# Patient Record
Sex: Male | Born: 1980 | Hispanic: No | Marital: Married | Smoking: Never smoker
Health system: Southern US, Community
[De-identification: ages and names within clinical notes are randomized; demographics above are authoritative.]

## PROBLEM LIST (undated history)

## (undated) DIAGNOSIS — G43809 Other migraine, not intractable, without status migrainosus: Secondary | ICD-10-CM

## (undated) DIAGNOSIS — I10 Essential (primary) hypertension: Secondary | ICD-10-CM

## (undated) DIAGNOSIS — K219 Gastro-esophageal reflux disease without esophagitis: Secondary | ICD-10-CM

## (undated) DIAGNOSIS — F329 Major depressive disorder, single episode, unspecified: Secondary | ICD-10-CM

## (undated) DIAGNOSIS — E032 Hypothyroidism due to medicaments and other exogenous substances: Secondary | ICD-10-CM

## (undated) DIAGNOSIS — G4733 Obstructive sleep apnea (adult) (pediatric): Secondary | ICD-10-CM

## (undated) DIAGNOSIS — E0591 Thyrotoxicosis, unspecified with thyrotoxic crisis or storm: Secondary | ICD-10-CM

## (undated) DIAGNOSIS — R0989 Other specified symptoms and signs involving the circulatory and respiratory systems: Secondary | ICD-10-CM

## (undated) DIAGNOSIS — F411 Generalized anxiety disorder: Secondary | ICD-10-CM

## (undated) DIAGNOSIS — E0500 Thyrotoxicosis with diffuse goiter without thyrotoxic crisis or storm: Secondary | ICD-10-CM

## (undated) DIAGNOSIS — E079 Disorder of thyroid, unspecified: Secondary | ICD-10-CM

## (undated) DIAGNOSIS — E349 Endocrine disorder, unspecified: Secondary | ICD-10-CM

## (undated) HISTORY — DX: Obstructive sleep apnea (adult) (pediatric): G47.33

## (undated) HISTORY — DX: Thyrotoxicosis with diffuse goiter without thyrotoxic crisis or storm: E05.00

## (undated) HISTORY — DX: Hypothyroidism due to medicaments and other exogenous substances: E03.2

## (undated) HISTORY — DX: Thyrotoxicosis, unspecified with thyrotoxic crisis or storm: E05.91

## (undated) HISTORY — DX: Other specified symptoms and signs involving the circulatory and respiratory systems: R09.89

## (undated) HISTORY — DX: Morbid (severe) obesity due to excess calories: E66.01

## (undated) HISTORY — DX: Generalized anxiety disorder: F41.1

## (undated) HISTORY — DX: Gastro-esophageal reflux disease without esophagitis: K21.9

## (undated) HISTORY — PX: ADENOIDECTOMY: SHX5191

## (undated) HISTORY — DX: Major depressive disorder, single episode, unspecified: F32.9

## (undated) HISTORY — DX: Other migraine, not intractable, without status migrainosus: G43.809

## (undated) HISTORY — PX: TONGUE SURGERY: SHX810

## (undated) HISTORY — DX: Endocrine disorder, unspecified: E34.9

---

## 2014-06-13 DIAGNOSIS — E032 Hypothyroidism due to medicaments and other exogenous substances: Secondary | ICD-10-CM | POA: Insufficient documentation

## 2014-06-13 DIAGNOSIS — I10 Essential (primary) hypertension: Secondary | ICD-10-CM | POA: Insufficient documentation

## 2014-06-13 DIAGNOSIS — Z8639 Personal history of other endocrine, nutritional and metabolic disease: Secondary | ICD-10-CM | POA: Insufficient documentation

## 2014-06-13 DIAGNOSIS — E349 Endocrine disorder, unspecified: Secondary | ICD-10-CM | POA: Insufficient documentation

## 2014-06-13 DIAGNOSIS — F329 Major depressive disorder, single episode, unspecified: Secondary | ICD-10-CM

## 2014-06-13 HISTORY — DX: Endocrine disorder, unspecified: E34.9

## 2014-06-13 HISTORY — DX: Hypothyroidism due to medicaments and other exogenous substances: E03.2

## 2014-06-13 HISTORY — DX: Morbid (severe) obesity due to excess calories: E66.01

## 2014-06-13 HISTORY — DX: Major depressive disorder, single episode, unspecified: F32.9

## 2014-08-30 HISTORY — PX: OTHER SURGICAL HISTORY: SHX169

## 2014-09-13 DIAGNOSIS — IMO0001 Reserved for inherently not codable concepts without codable children: Secondary | ICD-10-CM | POA: Insufficient documentation

## 2015-02-22 ENCOUNTER — Ambulatory Visit (INDEPENDENT_AMBULATORY_CARE_PROVIDER_SITE_OTHER): Payer: BLUE CROSS/BLUE SHIELD | Admitting: Family Medicine

## 2015-02-22 ENCOUNTER — Encounter: Payer: Self-pay | Admitting: Family Medicine

## 2015-02-22 VITALS — BP 137/84 | HR 82 | Temp 98.2°F | Ht 75.0 in | Wt 349.0 lb

## 2015-02-22 DIAGNOSIS — E291 Testicular hypofunction: Secondary | ICD-10-CM | POA: Diagnosis not present

## 2015-02-22 DIAGNOSIS — F329 Major depressive disorder, single episode, unspecified: Secondary | ICD-10-CM

## 2015-02-22 DIAGNOSIS — E032 Hypothyroidism due to medicaments and other exogenous substances: Secondary | ICD-10-CM

## 2015-02-22 DIAGNOSIS — G43809 Other migraine, not intractable, without status migrainosus: Secondary | ICD-10-CM | POA: Insufficient documentation

## 2015-02-22 DIAGNOSIS — E349 Endocrine disorder, unspecified: Secondary | ICD-10-CM

## 2015-02-22 DIAGNOSIS — E669 Obesity, unspecified: Secondary | ICD-10-CM

## 2015-02-22 DIAGNOSIS — L299 Pruritus, unspecified: Secondary | ICD-10-CM

## 2015-02-22 DIAGNOSIS — Z8639 Personal history of other endocrine, nutritional and metabolic disease: Secondary | ICD-10-CM

## 2015-02-22 DIAGNOSIS — H938X9 Other specified disorders of ear, unspecified ear: Secondary | ICD-10-CM | POA: Insufficient documentation

## 2015-02-22 DIAGNOSIS — I10 Essential (primary) hypertension: Secondary | ICD-10-CM

## 2015-02-22 DIAGNOSIS — H66001 Acute suppurative otitis media without spontaneous rupture of ear drum, right ear: Secondary | ICD-10-CM

## 2015-02-22 DIAGNOSIS — R5383 Other fatigue: Secondary | ICD-10-CM

## 2015-02-22 DIAGNOSIS — F32A Depression, unspecified: Secondary | ICD-10-CM

## 2015-02-22 MED ORDER — TESTOSTERONE CYPIONATE 100 MG/ML IM SOLN: 100.0000 mg | INTRAMUSCULAR | Status: DC

## 2015-02-22 MED ORDER — TRIAMCINOLONE 0.1 % CREAM:EUCERIN CREAM 1:1: 1.0000 "application " | TOPICAL_CREAM | Freq: Two times a day (BID) | CUTANEOUS | Status: DC | PRN

## 2015-02-22 MED ORDER — AMOXICILLIN-POT CLAVULANATE 875-125 MG PO TABS: 1.0000 | ORAL_TABLET | Freq: Two times a day (BID) | ORAL | Status: DC

## 2015-02-22 NOTE — Assessment & Plan Note (Signed)
Thyroid related vs medication induced vs cholestasis vs xerostomia. Will check labs as well as stop phentermine and topirimate. Given triamcinilone/eucerin cream for relief. Follow up in one month.

## 2015-02-22 NOTE — Progress Notes (Signed)
Tyler Hancock is a 35 y.o. male who presents to [facility] today for establish care.  Patient just moved to the area and is looking to establish care.   1) generalized pruritis: Patient has a 8 week history of intermittent, generalized pruritis. He has not noted any rash or particular area that itches most. He has changed detergents and body soaps several times to no relief. He has tried OTC cream and lotions that have not helped. He is bothered by this and would like to know what the cause is moreso than treat the symptom. 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 He has not noted small red bites, streaks, bugs of any kind. No jaundice noted. No dermatographia.   2) R ear pressure: patient has 3 week history of R ear pressure. He notes he had eustachian tube dilation bilaterally as well as sinus polyps removed and repair of a deviated septum months ago. He recovered quite well from this. He has no recent congestion or rhinorrhea, fevers, chills, cough. He had ENT surgery in [place] and has no ENT locally.  3) Fatigue: patient has a progressive onset of fatigue over the last month. Of note patient recently bought a house on 12/23 and has been in the process of moving in the last several weeks. Patient notes history of Graves s/p RAI ablation. He also notes he has not had testosterone levels checked lately.   4) depression: Patient that his mood may be depressed, though he has had a lot of changes recently so was hesitant to attribute his fatigue to this. Patient is starting marriage counseling with his wife this coming Tuesday. He notes energy is down, is on appetite suppresion, had one recent episode of sleeping 10 hours when normally he requires 6.   5) OSA: told he should be evaluated  in the past due to snoring and nocturnal apneic episodes. Snoring greatly improved after sinus surgery, no more apneic events. No morning HA or daytime somnolence outside of recent fatigue.   6) HM: defers flu today    No past medical history on file. No past surgical history on file. Social History  Substance Use Topics  . Smoking status: Never Smoker   . Smokeless tobacco: Not on file  . Alcohol Use: 0.0 oz/week    0 Standard drinks or equivalent per week   family history is not on file.  ROS as above Medications: Current Outpatient Prescriptions  Medication Sig Dispense Refill  . buPROPion (WELLBUTRIN SR) 150 MG 12 hr tablet Take 150 mg by mouth.    Marland Kitchen buPROPion (WELLBUTRIN XL) 300 MG 24 hr tablet Take 300 mg by mouth.    . levothyroxine (SYNTHROID, LEVOTHROID) 150 MCG tablet Take by mouth.    . losartan (COZAAR) 50 MG tablet Take by mouth.    . phentermine 37.5 MG capsule Take 37.5 mg by mouth.    . testosterone cypionate (DEPOTESTOSTERONE CYPIONATE) 200 MG/ML injection Inject into the muscle.    . topiramate (TOPAMAX) 25 MG tablet Take by mouth.     No current facility-administered medications for this visit.   Allergies not on file   Exam:  BP 137/84 mmHg  Pulse 82  Temp(Src) 98.2 F (36.8 C) (Oral)  Ht 6\' 3"  (1.905 m)  Wt 349 lb (158.305 kg)  BMI 43.62 kg/m2  SpO2  100% Gen: Well obese gentlemen seated in NAD HEENT: EOMI,  MMM. Sclera non-icteric and without pallor. TM retracted bilaterally, more pronounced on right side with prominent erythema and opacification.  Lungs: Normal work of breathing. CTABL no wheezes or crackles.  Heart: RRR no MRG Abd: NABS, Soft. Nondistended, Nontender Exts: Brisk capillary refill, warm and well perfused.  Psych: Mildly constricted affect. No stated SI/HI.  Skin: no lesions appreciated, no erythematous papules, streaks appreciated.   No results found for this or any previous visit (from the past 24 hour(s)). No results  found.   Please see individual assessment and plan sections.

## 2015-02-22 NOTE — Assessment & Plan Note (Signed)
Will re-evaluate in one month.

## 2015-02-22 NOTE — Assessment & Plan Note (Addendum)
Likely given exam, history and time course. Treat with Augmentin

## 2015-02-22 NOTE — Assessment & Plan Note (Signed)
Continue Wellbutrin. Check PHQ9 in next visit.

## 2015-02-22 NOTE — Assessment & Plan Note (Addendum)
Check TSH free T3 freeT4

## 2015-02-22 NOTE — Assessment & Plan Note (Signed)
Refill testosterone. Check testosterone levels

## 2015-02-22 NOTE — Assessment & Plan Note (Addendum)
Most likely due to depression, though medications, thyroid hormone abnormality or low T could be contributing. Will stop topirimate and phentermine and check T as well as basic labs. Follow up in one month.

## 2015-02-22 NOTE — Patient Instructions (Signed)
Thank you for coming in today. We believe symptoms in your ear is due to an ear infection. Please take the antibiotics as prescribed. For the fatigue, we will get labs to evaluate potential medical causes.  For the itching, we recommend stopping phentermine and topirimate. We recommend applying the cream prescribed twice a day as needed.  Please return in 1 month or sooner for continued or worsening symptoms.

## 2015-02-22 NOTE — Assessment & Plan Note (Signed)
Continue losartan. Check routine fasting labs

## 2015-02-23 ENCOUNTER — Telehealth: Payer: Self-pay | Admitting: Family Medicine

## 2015-02-23 NOTE — Telephone Encounter (Signed)
Received prior authorization for Triamcinolone Eucerin Compound sent through cover my meds waiting on authorization. - CF

## 2015-02-27 LAB — COMPREHENSIVE METABOLIC PANEL
ALK PHOS: 50 U/L (ref 40–115)
ALT: 23 U/L (ref 9–46)
AST: 19 U/L (ref 10–40)
Albumin: 4.2 g/dL (ref 3.6–5.1)
BUN: 15 mg/dL (ref 7–25)
CO2: 27 mmol/L (ref 20–31)
CREATININE: 1.1 mg/dL (ref 0.60–1.35)
Calcium: 9.3 mg/dL (ref 8.6–10.3)
Chloride: 101 mmol/L (ref 98–110)
GLUCOSE: 85 mg/dL (ref 65–99)
Potassium: 4.3 mmol/L (ref 3.5–5.3)
SODIUM: 140 mmol/L (ref 135–146)
TOTAL PROTEIN: 6.9 g/dL (ref 6.1–8.1)
Total Bilirubin: 0.6 mg/dL (ref 0.2–1.2)

## 2015-02-27 LAB — HEMOGLOBIN A1C
Hgb A1c MFr Bld: 5.2 % (ref <5.7)
Mean Plasma Glucose: 103 mg/dL (ref <117)

## 2015-02-27 LAB — LIPID PANEL
CHOLESTEROL: 121 mg/dL — AB (ref 125–200)
HDL: 44 mg/dL (ref >=40)
LDL Cholesterol: 69 mg/dL (ref <130)
Total CHOL/HDL Ratio: 2.8 Ratio (ref <=5.0)
Triglycerides: 38 mg/dL (ref <150)
VLDL: 8 mg/dL (ref <30)

## 2015-02-27 LAB — CBC
HCT: 48.8 % (ref 39.0–52.0)
HEMOGLOBIN: 16.6 g/dL (ref 13.0–17.0)
MCH: 30.8 pg (ref 26.0–34.0)
MCHC: 34 g/dL (ref 30.0–36.0)
MCV: 90.5 fL (ref 78.0–100.0)
MPV: 12.6 fL — ABNORMAL HIGH (ref 8.6–12.4)
PLATELETS: 201 10*3/uL (ref 150–400)
RBC: 5.39 MIL/uL (ref 4.22–5.81)
RDW: 13.6 % (ref 11.5–15.5)
WBC: 7.4 10*3/uL (ref 4.0–10.5)

## 2015-02-27 LAB — TSH: TSH: 5.546 u[IU]/mL — ABNORMAL HIGH (ref 0.350–4.500)

## 2015-02-27 LAB — T3, FREE: T3, Free: 3.4 pg/mL (ref 2.3–4.2)

## 2015-02-27 LAB — VITAMIN D 25 HYDROXY (VIT D DEFICIENCY, FRACTURES): VIT D 25 HYDROXY: 27 ng/mL — AB (ref 30–100)

## 2015-02-27 LAB — T4, FREE: Free T4: 1.22 ng/dL (ref 0.80–1.80)

## 2015-02-27 LAB — BILIRUBIN, FRACTIONATED(TOT/DIR/INDIR)
BILIRUBIN DIRECT: 0.2 mg/dL (ref <=0.2)
BILIRUBIN TOTAL: 0.6 mg/dL (ref 0.2–1.2)
Indirect Bilirubin: 0.4 mg/dL (ref 0.2–1.2)

## 2015-03-01 NOTE — Telephone Encounter (Signed)
Received message on Cover my meds stating medication does not require a prior authorization.....dhoward. I called the pharmacy and she reran the medication with an override and got payment. - CF

## 2015-03-04 LAB — TESTOS,TOTAL,FREE AND SHBG (FEMALE)
Sex Hormone Binding Glob.: 24 nmol/L (ref 10–50)
TESTOSTERONE,FREE: 42.5 pg/mL (ref 35.0–155.0)
TESTOSTERONE,TOTAL,LC/MS/MS: 271 ng/dL (ref 250–1100)

## 2015-03-06 ENCOUNTER — Telehealth: Payer: Self-pay | Admitting: *Deleted

## 2015-03-06 MED ORDER — TESTOSTERONE CYPIONATE 100 MG/ML IM SOLN: 100.0000 mg | INTRAMUSCULAR | Status: DC

## 2015-03-06 NOTE — Telephone Encounter (Signed)
I discussed the situation with the patient. We will attempt to work with the insurance company to obtain prior authorization for this medicine. However with cash payment at [facility] it is less than $30 for 10 mL vial. I prescribed a new prescription and a coupon from [facility] which she will be able to pick up and take to [facility].  He is pleased with the plan.

## 2015-03-06 NOTE — Progress Notes (Signed)
Quick Note:  1) Vitamin D deficiency noted. Take 2000 units of vitamin D daily over-the-counter.  2) Testosterone level is low-normal.  3) We will need to recheck thyroid tests in about 6-12 months. ______

## 2015-03-06 NOTE — Telephone Encounter (Signed)
Called pt and informed him of what was going on. Pt stated that he was upset that this has taken so long and that he was told that it would be taken care of before now only to find out that his medication had just been sent to his pharmacy. I apologized to him for the inconvenience that this has caused him and told him that this was not standard practice. Pt insisted that Dr. Georgina Snell call his pharmacy to provide more information to be able to get his medications. I told him that I had already done this however, I would let Dr. Georgina Snell know that he wanted him to do this for him.   I spoke with Dr. Georgina Snell and he said that he would call the pharmacy to do a peer to peer.Audelia Hives [place]

## 2015-03-06 NOTE — Telephone Encounter (Signed)
Called pt and informed him that I had spoken with the pharmacist at [facility] and was informed that since he does not have a record of a low testosterone on file his request for the refill could not be filled at this time. His claim will be open for 180 days of which he can appeal during this time should he be able to provide the needed information regarding the low lab levels. I asked him if he had any copies of these in his possession he stated that he was unable to locate them. He did inform me of a previous provider that should be able to give me the information that is needed. Dr. Donzetta Sprung @ [facility] in [place]. I told him that I would reach out to his office however I could not guarantee that I would be able to obtain this information without having a release of information signed by him. Pt voiced understanding. Maryruth Eve, [place] Crocker

## 2015-03-06 NOTE — Telephone Encounter (Signed)
I have made several attempts to speak with someone at Dr. Towanda Malkin office and was transferred to the medical records dept and the phone just rang and rang with no answer or vm.Marland KitchenMarland KitchenAudelia Hives [place]

## 2015-03-16 ENCOUNTER — Telehealth: Payer: Self-pay | Admitting: Family Medicine

## 2015-03-16 NOTE — Telephone Encounter (Signed)
Received fax from [facility] and they have reviewed the Testosterone and approved coverage from 03/05/2015 - 02/09/2038. Reference # [id number]. - CF

## 2015-03-26 ENCOUNTER — Ambulatory Visit (INDEPENDENT_AMBULATORY_CARE_PROVIDER_SITE_OTHER): Payer: BLUE CROSS/BLUE SHIELD | Admitting: Family Medicine

## 2015-03-26 ENCOUNTER — Encounter: Payer: Self-pay | Admitting: Family Medicine

## 2015-03-26 VITALS — BP 141/84 | HR 84 | Temp 98.0°F | Wt 351.0 lb

## 2015-03-26 DIAGNOSIS — E349 Endocrine disorder, unspecified: Secondary | ICD-10-CM

## 2015-03-26 DIAGNOSIS — H938X1 Other specified disorders of right ear: Secondary | ICD-10-CM | POA: Diagnosis not present

## 2015-03-26 DIAGNOSIS — R5383 Other fatigue: Secondary | ICD-10-CM

## 2015-03-26 DIAGNOSIS — E032 Hypothyroidism due to medicaments and other exogenous substances: Secondary | ICD-10-CM | POA: Diagnosis not present

## 2015-03-26 DIAGNOSIS — E291 Testicular hypofunction: Secondary | ICD-10-CM

## 2015-03-26 MED ORDER — LEVOTHYROXINE SODIUM 175 MCG PO TABS: 175.0000 ug | ORAL_TABLET | Freq: Every day | ORAL | Status: DC

## 2015-03-26 NOTE — Progress Notes (Signed)
       Tyler Hancock is a 35 y.o. male who presents to [facility] today for follow-up fatigue hypertension and ear complaint. Patient was seen last month for the above complaints and is here today follow-up.  1) fatigue: Felt to be possibly related to depression or possibly hormonal imbalance. His TSH was slightly elevated and his testosterone level was in the low range of normal at 270. He notes difficulty staying asleep. He does note some depression symptoms but feels as though his symptoms are not necessarily related to depression. His symptoms are moderately bothersome.  2) right ear pressure and fullness. This is been ongoing now for some time. He has already had eustachian tube surgery which has helped a little. He had treatment with Augmentin at the last visit. He notes is a little better but still bothersome with his symptoms.  3) hypertension: Well controlled with losartan. No chest pain palpitations or shortness of breath.   No past medical history on file. No past surgical history on file. Social History  Substance Use Topics  . Smoking status: Never Smoker   . Smokeless tobacco: Not on file  . Alcohol Use: 0.0 oz/week    0 Standard drinks or equivalent per week   family history is not on file.  ROS as above Medications: Current Outpatient Prescriptions  Medication Sig Dispense Refill  . buPROPion (WELLBUTRIN SR) 150 MG 12 hr tablet Take 150 mg by mouth.    Marland Kitchen buPROPion (WELLBUTRIN XL) 300 MG 24 hr tablet Take 300 mg by mouth.    . levothyroxine (SYNTHROID, LEVOTHROID) 175 MCG tablet Take 1 tablet (175 mcg total) by mouth daily. 30 tablet 1  . losartan (COZAAR) 50 MG tablet Take by mouth.    . testosterone cypionate (DEPO-TESTOSTERONE) 100 MG/ML injection Inject 1 mL (100 mg total) into the muscle every 7 (seven) days. For IM use only (Patient taking differently: Inject 150 mg  into the muscle every 7 (seven) days. For IM use only) 10 mL 0  . Triamcinolone Acetonide (TRIAMCINOLONE 0.1 % CREAM : EUCERIN) CREA Apply 1 application topically 2 (two) times daily as needed for itching. Disp 1 pound 1 each 1   No current facility-administered medications for this visit.   Allergies not on file   Exam:  BP 141/84 mmHg  Pulse 84  Temp(Src) 98 F (36.7 C) (Oral)  Wt 351 lb (159.213 kg)  SpO2 99% Gen: Well NAD morbidly obese HEENT: EOMI,  MMM membranes are normal appearing bilaterally. Lungs: Normal work of breathing. CTABL Heart: RRR no MRG Abd: NABS, Soft. Nondistended, Nontender Exts: Brisk capillary refill, warm and well perfused. Psych: Alert and oriented normal speech thought process and affect. PHQ9: Is 10  Lab Results  Component Value Date   TSH 5.546* 02/26/2015     No results found for this or any previous visit (from the past 24 hour(s)). No results found.   Please see individual assessment and plan sections.

## 2015-03-26 NOTE — Patient Instructions (Signed)
Thank you for coming in today. Increase levothyroxine dose to 119mcg daily.  Increase testosterone dose to 150mg  weekly.  Return in 1-2 months.  We will refer to an ENT doctor.  Call if you do not hear from them.

## 2015-03-26 NOTE — Assessment & Plan Note (Signed)
Refer to ENT

## 2015-03-26 NOTE — Assessment & Plan Note (Signed)
Possibly low thyroid or testosterone levels. We'll correct both. Recheck in 1-2 months. If still abnormal will consider starting an SSRI for depression.

## 2015-03-26 NOTE — Assessment & Plan Note (Signed)
Increase levothyroxine from 150 g to 175 g daily.

## 2015-03-26 NOTE — Assessment & Plan Note (Signed)
Increase testosterone to 150 mg per week recheck in 1-2 months

## 2015-04-19 ENCOUNTER — Other Ambulatory Visit: Payer: Self-pay

## 2015-04-19 MED ORDER — TESTOSTERONE CYPIONATE 100 MG/ML IM SOLN: 150.0000 mg | INTRAMUSCULAR | Status: DC

## 2015-04-25 ENCOUNTER — Telehealth: Payer: Self-pay

## 2015-04-25 NOTE — Telephone Encounter (Signed)
Pt's wife called the this morning stating that her husband would like to restart phentermine. I advised her per office guidelines the patient needs to come to the office for a weight and BP check to restart this medication. It is also time for the patient to follow up for other medical issues. Pts wife questioned the need to come into the clinic if he was in for a visit 1 month ago and that they have a high deductible insurance plan.  I restated that per guidelines he would need to come in. Pts wife verbalized understanding.  Patient called and left several VMs during clinic regarding the matter above. Pt called at  5:00 pm and discussed the above matter with Cristino Martes, LPN. Kelsi advised pt that because phentermine is a controlled substance and per office guidelines he would need to be seen by Dr. Georgina Snell to restart medication. Pt expressed unhappiness with this policy and verbalized the possible desire to find a new PCP. Requested that he be contacted by Dr. Georgina Snell regarding this matter. Routing to PCP.

## 2015-04-26 NOTE — Telephone Encounter (Signed)
I discussed with Tyler Hancock this morning. He will come in for a nurse visit in the near future. If BP and HR are ok. We will rx phenetamine.

## 2015-05-01 ENCOUNTER — Ambulatory Visit (INDEPENDENT_AMBULATORY_CARE_PROVIDER_SITE_OTHER): Payer: BLUE CROSS/BLUE SHIELD | Admitting: Family Medicine

## 2015-05-01 ENCOUNTER — Encounter: Payer: Self-pay | Admitting: Family Medicine

## 2015-05-01 VITALS — BP 137/92 | HR 90 | Wt 352.0 lb

## 2015-05-01 DIAGNOSIS — I10 Essential (primary) hypertension: Secondary | ICD-10-CM | POA: Diagnosis not present

## 2015-05-01 DIAGNOSIS — R635 Abnormal weight gain: Secondary | ICD-10-CM | POA: Insufficient documentation

## 2015-05-01 MED ORDER — LOSARTAN POTASSIUM 50 MG PO TABS: 50.0000 mg | ORAL_TABLET | Freq: Every day | ORAL | Status: DC

## 2015-05-01 MED ORDER — NALTREXONE HCL 50 MG PO TABS: 50.0000 mg | ORAL_TABLET | Freq: Every day | ORAL | Status: DC

## 2015-05-01 NOTE — Patient Instructions (Signed)
Thank you for coming in today. Add naltrexone to get a similar medicine to Contrave.  Return in 6 months or so.  Take blood pressure medicine.  Call or go to the emergency room if you get worse, have trouble breathing, have chest pains, or palpitations.  We will refer to a dietitian for help with meal planning.

## 2015-05-01 NOTE — Assessment & Plan Note (Signed)
Discussed options. Plan to start naltrexone as he is already taking the bupropion component of Contrave. Additionally referred to registered dietitian. Recheck in several months

## 2015-05-01 NOTE — Progress Notes (Signed)
       Tyler Hancock is a 35 y.o. male who presents to [facility] today for follow-up hypertension and obesity.  1) hypertension: Managed well with losartan. No chest pains palpitations or shortness of breath.  2) obesity: Patient is a difficulty maintaining his body weight since he started having thyroid problems. He's been taking phentermine intermittently. He notes it stopped working and he was discontinued. He like to restart some sort of weight loss medicine. He's done some research and is interested in [place]. Cost is a significant factor for his decision-making as he has a [employer]ud[employer]. He has difficulty regulating his food intake. He has food cravings and tends to overeat.   No past medical history on file. No past surgical history on file. Social History  Substance Use Topics  . Smoking status: Never Smoker   . Smokeless tobacco: Not on file  . Alcohol Use: 0.0 oz/week    0 Standard drinks or equivalent per week   family history is not on file.  ROS as above Medications: Current Outpatient Prescriptions  Medication Sig Dispense Refill  . buPROPion (WELLBUTRIN SR) 150 MG 12 hr tablet Take 150 mg by mouth.    Marland Kitchen buPROPion (WELLBUTRIN XL) 300 MG 24 hr tablet Take 300 mg by mouth.    . levothyroxine (SYNTHROID, LEVOTHROID) 175 MCG tablet Take 1 tablet (175 mcg total) by mouth daily. 30 tablet 1  . losartan (COZAAR) 50 MG tablet Take 1 tablet (50 mg total) by mouth daily. 90 tablet 2  . testosterone cypionate (DEPO-TESTOSTERONE) 100 MG/ML injection Inject 1.5 mLs (150 mg total) into the muscle every 7 (seven) days. For IM use only 10 mL 0  . Triamcinolone Acetonide (TRIAMCINOLONE 0.1 % CREAM : EUCERIN) CREA Apply 1 application topically 2 (two) times daily as needed for itching. Disp 1 pound 1 each 1  . naltrexone (DEPADE) 50 MG tablet Take 1 tablet (50 mg  total) by mouth daily. 30 tablet 3   No current facility-administered medications for this visit.   No Known Allergies   Exam:  BP 137/92 mmHg  Pulse 90  Wt 352 lb (159.666 kg) Gen: Well NAD Obese HEENT: EOMI,  MMM Lungs: Normal work of breathing. CTABL Heart: RRR no MRG Abd: NABS, Soft. Nondistended, Nontender Exts: Brisk capillary refill, warm and well perfused.   No results found for this or any previous visit (from the past 24 hour(s)). No results found.   Please see individual assessment and plan sections.

## 2015-05-01 NOTE — Assessment & Plan Note (Signed)
Doing well. Refill losartan.

## 2015-05-11 ENCOUNTER — Encounter: Payer: Self-pay | Admitting: Family Medicine

## 2015-05-11 MED ORDER — BUPROPION HCL ER (XL) 300 MG PO TB24: 300.0000 mg | ORAL_TABLET | Freq: Every day | ORAL | Status: DC

## 2015-05-11 MED ORDER — BUPROPION HCL ER (SR) 150 MG PO TB12: 150.0000 mg | ORAL_TABLET | Freq: Every day | ORAL | Status: DC

## 2015-05-11 MED ORDER — LOSARTAN POTASSIUM 100 MG PO TABS: 100.0000 mg | ORAL_TABLET | Freq: Every day | ORAL | Status: DC

## 2015-05-14 ENCOUNTER — Encounter: Payer: Self-pay | Admitting: Family Medicine

## 2015-05-17 ENCOUNTER — Ambulatory Visit: Payer: BLUE CROSS/BLUE SHIELD | Admitting: Family Medicine

## 2015-05-22 ENCOUNTER — Encounter: Payer: Self-pay | Admitting: Family Medicine

## 2015-05-22 ENCOUNTER — Other Ambulatory Visit: Payer: Self-pay

## 2015-05-22 MED ORDER — LEVOTHYROXINE SODIUM 175 MCG PO TABS: 175.0000 ug | ORAL_TABLET | Freq: Every day | ORAL | Status: DC

## 2015-05-23 ENCOUNTER — Encounter: Payer: Self-pay | Admitting: Family Medicine

## 2015-05-23 ENCOUNTER — Ambulatory Visit (INDEPENDENT_AMBULATORY_CARE_PROVIDER_SITE_OTHER): Payer: BLUE CROSS/BLUE SHIELD | Admitting: Family Medicine

## 2015-05-23 VITALS — BP 139/93 | HR 77 | Wt 359.0 lb

## 2015-05-23 DIAGNOSIS — R635 Abnormal weight gain: Secondary | ICD-10-CM | POA: Diagnosis not present

## 2015-05-23 NOTE — Assessment & Plan Note (Signed)
Continue naltrexone and Wellbutrin. Follow-up with registered dietitian. Discussed opportunistic exercises during the day. Recheck in a few months.

## 2015-05-23 NOTE — Progress Notes (Signed)
       Tyler Hancock is a 35 y.o. male who presents to [facility]: [facility] today for follow up obesity. Patient notes that he has an appointment with the RD coming up later this week. He has not changed his diet. He is tolerating the naltrexone well. He is not exercising much.    No past medical history on file. No past surgical history on file. Social History  Substance Use Topics  . Smoking status: Never Smoker   . Smokeless tobacco: Not on file  . Alcohol Use: 0.0 oz/week    0 Standard drinks or equivalent per week   family history is not on file.  ROS as above Medications: Current Outpatient Prescriptions  Medication Sig Dispense Refill  . buPROPion (WELLBUTRIN SR) 150 MG 12 hr tablet Take 1 tablet (150 mg total) by mouth daily. 90 tablet 2  . buPROPion (WELLBUTRIN XL) 300 MG 24 hr tablet Take 1 tablet (300 mg total) by mouth daily. 90 tablet 2  . levothyroxine (SYNTHROID, LEVOTHROID) 175 MCG tablet Take 1 tablet (175 mcg total) by mouth daily. 90 tablet 1  . losartan (COZAAR) 100 MG tablet Take 1 tablet (100 mg total) by mouth daily. 90 tablet 1  . naltrexone (DEPADE) 50 MG tablet Take 1 tablet (50 mg total) by mouth daily. 30 tablet 3  . testosterone cypionate (DEPO-TESTOSTERONE) 100 MG/ML injection Inject 1.5 mLs (150 mg total) into the muscle every 7 (seven) days. For IM use only 10 mL 0   No current facility-administered medications for this visit.   No Known Allergies   Exam:  BP 139/93 mmHg  Pulse 77  Wt 359 lb (162.841 kg) Gen: Well NAD HEENT: EOMI,  MMM Lungs: Normal work of breathing. CTABL Heart: RRR no MRG Abd: NABS, Soft. Nondistended, Nontender Exts: Brisk capillary refill, warm and well perfused.   No results found for this or any previous visit (from the past 24 hour(s)). No results found.   Please see individual assessment and plan sections.

## 2015-05-28 ENCOUNTER — Other Ambulatory Visit: Payer: Self-pay | Admitting: Family Medicine

## 2015-05-28 MED ORDER — BUPROPION HCL ER (XL) 150 MG PO TB24: 150.0000 mg | ORAL_TABLET | ORAL | Status: DC

## 2015-06-12 ENCOUNTER — Emergency Department (INDEPENDENT_AMBULATORY_CARE_PROVIDER_SITE_OTHER)
Admission: EM | Admit: 2015-06-12 | Discharge: 2015-06-12 | Payer: BLUE CROSS/BLUE SHIELD | Source: Home / Self Care | Attending: Family Medicine | Admitting: Family Medicine

## 2015-06-12 ENCOUNTER — Encounter: Payer: Self-pay | Admitting: *Deleted

## 2015-06-12 DIAGNOSIS — I493 Ventricular premature depolarization: Secondary | ICD-10-CM

## 2015-06-12 DIAGNOSIS — R072 Precordial pain: Secondary | ICD-10-CM

## 2015-06-12 HISTORY — DX: Essential (primary) hypertension: I10

## 2015-06-12 HISTORY — DX: Disorder of thyroid, unspecified: E07.9

## 2015-06-12 NOTE — ED Provider Notes (Signed)
CSN: [id number]     Arrival date & time 06/12/15  1834 History   First MD Initiated Contact with Patient 06/12/15 1854     Chief Complaint  Patient presents with  . Chest Pain      HPI Comments: About 1.5 hours ago patient developed a vague pressure-like sensation in his anterior chest, associated with cold sweats and mild nausea.  During this time he has felt like his heart is "beating in my throat."  The pain does not radiate, and he denies arm or jaw pain.  No past history of CV problems. Family history of CV disease and stroke in a paternal grandmother.  Patient is a 35 y.o. male presenting with chest pain. The history is provided by the patient.  Chest Pain Pain location:  Substernal area Pain quality: pressure   Pain quality: not radiating and not stabbing   Pain radiates to:  Does not radiate Pain severity:  Mild Onset quality:  Sudden Duration:  90 minutes Timing:  Constant Progression:  Improving Chronicity:  New Context: at rest   Relieved by:  None tried Worsened by:  Nothing tried Ineffective treatments:  None tried Associated symptoms: nausea and palpitations   Associated symptoms: no abdominal pain, no AICD problem, no anorexia, no anxiety, no back pain, no cough, no diaphoresis, no dizziness, no dysphagia, no fatigue, no fever, no heartburn, no lower extremity edema, no near-syncope, no orthopnea, no PND, no shortness of breath, no syncope, not vomiting and no weakness   Risk factors: male sex and obesity     Past Medical History  Diagnosis Date  . Thyroid disease   . Hypertension    History reviewed. No pertinent past surgical history. Family History  Problem Relation Age of Onset  . Stroke Other    Social History  Substance Use Topics  . Smoking status: Never Smoker   . Smokeless tobacco: None  . Alcohol Use: 0.0 oz/week    0 Standard drinks or equivalent per week    Review of Systems  Constitutional: Negative for fever, diaphoresis and fatigue.   HENT: Negative for trouble swallowing.   Respiratory: Negative for cough and shortness of breath.   Cardiovascular: Positive for chest pain and palpitations. Negative for orthopnea, syncope, PND and near-syncope.  Gastrointestinal: Positive for nausea. Negative for heartburn, vomiting, abdominal pain and anorexia.  Musculoskeletal: Negative for back pain.  Neurological: Negative for dizziness and weakness.  All other systems reviewed and are negative.   Allergies  Review of patient's allergies indicates no known allergies.  Home Medications   Prior to Admission medications   Medication Sig Start Date End Date Taking? Authorizing Provider  buPROPion (WELLBUTRIN XL) 150 MG 24 hr tablet Take 1 tablet (150 mg total) by mouth every morning. 05/28/15   Gregor Hams, MD  buPROPion (WELLBUTRIN XL) 300 MG 24 hr tablet Take 1 tablet (300 mg total) by mouth daily. 05/11/15   Gregor Hams, MD  levothyroxine (SYNTHROID, LEVOTHROID) 175 MCG tablet Take 1 tablet (175 mcg total) by mouth daily. 05/22/15   Gregor Hams, MD  losartan (COZAAR) 100 MG tablet Take 1 tablet (100 mg total) by mouth daily. 05/11/15   Gregor Hams, MD  naltrexone (DEPADE) 50 MG tablet Take 1 tablet (50 mg total) by mouth daily. 05/01/15   Gregor Hams, MD  testosterone cypionate (DEPO-TESTOSTERONE) 100 MG/ML injection Inject 1.5 mLs (150 mg total) into the muscle every 7 (seven) days. For IM use only 04/19/15  Gregor Hams, MD   Meds Ordered and Administered this Visit  Medications - No data to display  BP 155/94 mmHg  Pulse 64  Temp(Src) 98 F (36.7 C) (Oral)  Resp 18  Ht 6\' 3"  (1.905 m)  Wt 350 lb (158.759 kg)  BMI 43.75 kg/m2  SpO2 97% No data found.  Nursing notes and Vital Signs reviewed. Appearance:  Patient appears stated age, and in no acute distress.  Patient is obese (BMI 43.8) Eyes:  Pupils are equal, round, and reactive to light and accomodation.  Extraocular movement is intact.  Conjunctivae are not inflamed    Nose:   Normal Pharynx:  Normal Neck:  Supple.  No adenopathy Lungs:  Clear to auscultation.  Breath sounds are equal.  Moving air well. Heart:  Regular rate and rhythm without murmurs, rubs, or gallops.  Abdomen:  Nontender without masses or hepatosplenomegaly.  Bowel sounds are present.  No CVA or flank tenderness.  Extremities:  No edema.  Skin:  No rash present.   Physical Exam  ED Course  Procedures none    Labs Reviewed -  EKG: Rate:  73 BPM PR:  148 msec QT:  360 msec QTcH:  382 msec QRSD:  102 msec QRS axis:  -12 degrees Interpretation:   Sinus rhythm;  Unifocal PVC's  MDM   1. Precordial pain   2. Premature ventricular contractions (PVCs) (VPCs)    With new onset of unexplained PVC's recommend evaluation in local ER. Patient declined emergency transport to [facility] ER.   Advised to proceed immediately to ER (wife to drive); patient's vital signs stable.    Kandra Nicolas, MD 06/19/15 [phone]

## 2015-06-12 NOTE — Discharge Instructions (Signed)
Proceed immediately to [facility] Emergency Department.

## 2015-06-12 NOTE — ED Notes (Signed)
Pt c/o sweating, center of chest pressure, nausea, and feeling like his heart is beating in his throat x 1-1 1/2 hr. Denies arm pain or jaw pain. Denies any recent URI or increased stress.

## 2015-06-13 ENCOUNTER — Telehealth: Payer: Self-pay | Admitting: *Deleted

## 2015-06-26 ENCOUNTER — Encounter: Payer: Self-pay | Admitting: Family Medicine

## 2015-06-26 ENCOUNTER — Ambulatory Visit (INDEPENDENT_AMBULATORY_CARE_PROVIDER_SITE_OTHER): Payer: BLUE CROSS/BLUE SHIELD | Admitting: Family Medicine

## 2015-06-26 VITALS — BP 156/92 | HR 82 | Wt 361.0 lb

## 2015-06-26 DIAGNOSIS — R0789 Other chest pain: Secondary | ICD-10-CM | POA: Diagnosis not present

## 2015-06-26 DIAGNOSIS — R079 Chest pain, unspecified: Secondary | ICD-10-CM | POA: Insufficient documentation

## 2015-06-26 MED ORDER — GI COCKTAIL ~~LOC~~
30.0000 mL | Freq: Once | ORAL | Status: AC
  Administered 2015-06-26: 30 mL via ORAL

## 2015-06-26 NOTE — Patient Instructions (Signed)
Thank you for coming in today. Call or go to the emergency room if you get worse, have trouble breathing, have chest pains, or palpitations.  Follow up with your heart doctor tomorrow.  Return sooner if needed.

## 2015-06-26 NOTE — Assessment & Plan Note (Signed)
Relatively normal workup today as well as in the emergency department about 2 weeks ago. Recommend patient follow-up with cardiology tomorrow. Discussed warning signs or symptoms. Return following cardiology evaluation. He will likely benefit from echocardiogram and stress test evaluation.

## 2015-06-26 NOTE — Progress Notes (Signed)
       Tyler Hancock is a 35 y.o. male who presents to [facility] today for chest pain. Patient developed chest pain several weeks ago and was seen at the emergency department at [facility] and [place]. At the hospital he had a relatively normal EKG and normal labs including serial troponins and a normal chest x-ray. He was scheduled with cardiology tomorrow. He notes continued chest pains and is a bit worried. He notes the pain is central nonradiating and nonexertional. The pain comes and goes and does not seem to be related to food or exertion or breathing. He notes the pain seems to worsen with his anxiety. No fevers chills vomiting or diarrhea. No treatment tried yet.   Past Medical History  Diagnosis Date  . Thyroid disease   . Hypertension    No past surgical history on file. Social History  Substance Use Topics  . Smoking status: Never Smoker   . Smokeless tobacco: Not on file  . Alcohol Use: 0.0 oz/week    0 Standard drinks or equivalent per week   family history includes Stroke in his other.  ROS as above Medications: Current Outpatient Prescriptions  Medication Sig Dispense Refill  . buPROPion (WELLBUTRIN XL) 150 MG 24 hr tablet Take 1 tablet (150 mg total) by mouth every morning. 90 tablet 0  . buPROPion (WELLBUTRIN XL) 300 MG 24 hr tablet Take 1 tablet (300 mg total) by mouth daily. 90 tablet 2  . levothyroxine (SYNTHROID, LEVOTHROID) 175 MCG tablet Take 1 tablet (175 mcg total) by mouth daily. 90 tablet 1  . losartan (COZAAR) 100 MG tablet Take 1 tablet (100 mg total) by mouth daily. 90 tablet 1  . naltrexone (DEPADE) 50 MG tablet Take 1 tablet (50 mg total) by mouth daily. 30 tablet 3  . testosterone cypionate (DEPO-TESTOSTERONE) 100 MG/ML injection Inject 1.5 mLs (150 mg total) into the muscle every 7 (seven) days. For IM use only 10 mL 0   No current  facility-administered medications for this visit.   No Known Allergies   Exam:  BP 156/92 mmHg  Pulse 82  Wt 361 lb (163.749 kg)  SpO2 96% Gen: Well NAD Morbidly obese HEENT: EOMI,  MMM Lungs: Normal work of breathing. CTABL Heart: RRR no MRG Abd: NABS, Soft. Nondistended, Nontender Exts: Brisk capillary refill, warm and well perfused.   Patient was given a GI cocktail which did not help  12-lead EKG shows normal sinus rhythm at 76 bpm. No ST segment elevation or depression. Voltage criteria for LVH is present.  No results found for this or any previous visit (from the past 24 hour(s)). No results found.   Please see individual assessment and plan sections.

## 2015-06-27 ENCOUNTER — Encounter: Payer: Self-pay | Admitting: Family Medicine

## 2015-06-27 DIAGNOSIS — R9431 Abnormal electrocardiogram [ECG] [EKG]: Secondary | ICD-10-CM | POA: Insufficient documentation

## 2015-07-03 ENCOUNTER — Telehealth: Payer: Self-pay

## 2015-07-03 MED ORDER — TESTOSTERONE CYPIONATE 200 MG/ML IM SOLN: 150.0000 mg | INTRAMUSCULAR | Status: DC

## 2015-07-03 NOTE — Telephone Encounter (Signed)
pts wife notified. rx faxed.

## 2015-07-03 NOTE — Telephone Encounter (Signed)
Pts wife called stating that the pt is having difficulty injecting 1.5 mLs (150 mg total) of testosterone cypionate (DEPO-TESTOSTERONE) 100 MG/MLinto the muscle due to the volume. She stated that the pt would like to switch to  testosterone cypionate (DEPO-TESTOSTERONE) 200 MG/ML and Inject 0.75 mLs (150 mg total) into the muscle every 7 (seven) days. Please advise and change rx if it is appropriate.

## 2015-07-03 NOTE — Telephone Encounter (Signed)
New prescription for Depo-testosterone 200 mg/mL ordered

## 2015-07-07 ENCOUNTER — Other Ambulatory Visit: Payer: Self-pay | Admitting: Family Medicine

## 2015-07-13 ENCOUNTER — Other Ambulatory Visit: Payer: Self-pay | Admitting: Family Medicine

## 2015-07-17 ENCOUNTER — Other Ambulatory Visit: Payer: Self-pay

## 2015-07-17 MED ORDER — SYRINGE DISPOSABLE 3 ML MISC: Status: DC

## 2015-08-17 ENCOUNTER — Encounter: Payer: Self-pay | Admitting: Family Medicine

## 2015-08-22 ENCOUNTER — Ambulatory Visit (INDEPENDENT_AMBULATORY_CARE_PROVIDER_SITE_OTHER): Payer: BLUE CROSS/BLUE SHIELD | Admitting: Family Medicine

## 2015-08-22 ENCOUNTER — Encounter: Payer: Self-pay | Admitting: Family Medicine

## 2015-08-22 ENCOUNTER — Other Ambulatory Visit: Payer: Self-pay

## 2015-08-22 VITALS — BP 115/75 | HR 91 | Wt 363.0 lb

## 2015-08-22 DIAGNOSIS — E669 Obesity, unspecified: Secondary | ICD-10-CM

## 2015-08-22 DIAGNOSIS — E349 Endocrine disorder, unspecified: Secondary | ICD-10-CM

## 2015-08-22 DIAGNOSIS — E032 Hypothyroidism due to medicaments and other exogenous substances: Secondary | ICD-10-CM

## 2015-08-22 DIAGNOSIS — R635 Abnormal weight gain: Secondary | ICD-10-CM | POA: Diagnosis not present

## 2015-08-22 DIAGNOSIS — I10 Essential (primary) hypertension: Secondary | ICD-10-CM | POA: Diagnosis not present

## 2015-08-22 DIAGNOSIS — Z800 Family history of malignant neoplasm of digestive organs: Secondary | ICD-10-CM | POA: Insufficient documentation

## 2015-08-22 DIAGNOSIS — E291 Testicular hypofunction: Secondary | ICD-10-CM

## 2015-08-22 DIAGNOSIS — R079 Chest pain, unspecified: Secondary | ICD-10-CM

## 2015-08-22 MED ORDER — SERTRALINE HCL 50 MG PO TABS: 50.0000 mg | ORAL_TABLET | Freq: Every day | ORAL | Status: DC

## 2015-08-22 MED ORDER — TESTOSTERONE CYPIONATE 200 MG/ML IM SOLN: 150.0000 mg | INTRAMUSCULAR | Status: DC

## 2015-08-22 NOTE — Progress Notes (Signed)
Tyler Hancock is a 35 y.o. male who presents to [facility] today for follow-up testosterone, thyroid, depression and anxiety and chest pain.  Chest pain: Patient has been thoroughly evaluated first episode of chest pain. He notes the pain did not occur while he was on vacation and occurred again when he returned to work. He's had a pretty extensive cardiac evaluation that was normal so far. He does not worsening anxiety and depression symptoms and stress at work. He denies any SI or HI but does feel down and depressed and hopeless feeling tired a little energy and poor appetite nervous anxious and difficulty controlling worrying.  Testosterone: Doing quite well. Patient has improved libido. He takes 150 mg of testosterone weekly he notes his testosterone is dispensed in 1 mL vials and his doses 0.75 mL. He has some wastage and tends to run out of testosterone early. He would like enough for 12 weeks.  Thyroid: Patient had mildly elevated TSH at 6 months ago. He is due for recheck today.     Past Medical History  Diagnosis Date  . Thyroid disease   . Hypertension    No past surgical history on file. Social History  Substance Use Topics  . Smoking status: Never Smoker   . Smokeless tobacco: Not on file  . Alcohol Use: 0.0 oz/week    0 Standard drinks or equivalent per week   family history includes Stroke in his other.  ROS as above:  Medications: Current Outpatient Prescriptions  Medication Sig Dispense Refill  . B-D 3CC LUER-LOK SYR 25GX1" 25G X 1" 3 ML MISC USE AS DIRECTED 10 each 0  . B-D HYPODERMIC NEEDLE 18GX1.5" 18G X 1-1/2" MISC USE AS DIRECTED 10 each 0  . buPROPion (WELLBUTRIN XL) 150 MG 24 hr tablet Take 1 tablet (150 mg total) by mouth every morning. 90 tablet 0  . buPROPion (WELLBUTRIN XL) 300 MG 24 hr tablet Take 1 tablet (300 mg total) by mouth  daily. 90 tablet 2  . levothyroxine (SYNTHROID, LEVOTHROID) 175 MCG tablet Take 1 tablet (175 mcg total) by mouth daily. 90 tablet 1  . losartan (COZAAR) 100 MG tablet Take 1 tablet (100 mg total) by mouth daily. 90 tablet 1  . SYRINGE DISPOSABLE 3CC 3 ML MISC Use 1 syringe per week with testosterone injection. 52 each 0  . testosterone cypionate (DEPO-TESTOSTERONE) 200 MG/ML injection Inject 0.75 mLs (150 mg total) into the muscle every 7 (seven) days. 12 mL 0  . sertraline (ZOLOFT) 50 MG tablet Take 1 tablet (50 mg total) by mouth daily. 30 tablet 0   No current facility-administered medications for this visit.   No Known Allergies   Exam:  BP 115/75 mmHg  Pulse 91  Wt 363 lb (164.656 kg) Gen: Well NAD Morbidly obese HEENT: EOMI,  MMM Lungs: Normal work of breathing. CTABL Heart: RRR no MRG Abd: NABS, Soft. Nondistended, Nontender Exts: Brisk capillary refill, warm and well perfused.   Psych: Alert and oriented normal speech thought process. No SI or HI expressed.  Depression screen PHQ 2/9 08/22/2015  Decreased Interest 2  Down, Depressed, Hopeless 3  PHQ - 2 Score 5  Altered sleeping 1  Tired, decreased energy 3  Change in appetite 2  Feeling bad or failure about yourself  2  Trouble concentrating 0  Moving slowly or fidgety/restless 1  Suicidal thoughts 0  PHQ-9 Score 14  Difficult doing work/chores Very difficult  GAD7: 13  No results found for this or any previous visit (from the past 24 hour(s)). No results found.    Assessment and Plan: 35 y.o. male with   Chest pain: I think this is probably stress related. Certainly he has anxiety and depression that is not well-controlled. Plan to refer to GI for possible esophageal spasm evaluation.   Stress and anxiety and depression: Not well-controlled with Wellbutrin. Start Zoloft. Continue counseling. Recheck in 2 weeks  Testosterone: Check testosterone in the next few days. Refilled testosterone was 12 mL  applied for 12 weeks.  Family history of colon cancer: Refer to GI  Hypothyroidism: Recheck TSH and free T4 T3  Discussed warning signs or symptoms. Please see discharge instructions. Patient expresses understanding.

## 2015-08-22 NOTE — Patient Instructions (Addendum)
Thank you for coming in today. Get morning labs midcycle testosterone to check labs.   Start Zoloft. Take one half tablet daily for one week and increase to 1 tablet daily for one week. Recheck depression and anxiety in 1-3 weeks. Return sooner if worsening  You should hear from digestive health specialists later this week or early next week.

## 2015-08-28 LAB — TSH: TSH: 4.82 mIU/L — ABNORMAL HIGH (ref 0.40–4.50)

## 2015-08-28 LAB — COMPREHENSIVE METABOLIC PANEL
ALBUMIN: 4.4 g/dL (ref 3.6–5.1)
ALT: 22 U/L (ref 9–46)
AST: 16 U/L (ref 10–40)
Alkaline Phosphatase: 44 U/L (ref 40–115)
BILIRUBIN TOTAL: 0.8 mg/dL (ref 0.2–1.2)
BUN: 17 mg/dL (ref 7–25)
CO2: 26 mmol/L (ref 20–31)
CREATININE: 0.97 mg/dL (ref 0.60–1.35)
Calcium: 9.4 mg/dL (ref 8.6–10.3)
Chloride: 103 mmol/L (ref 98–110)
Glucose, Bld: 94 mg/dL (ref 65–99)
Potassium: 3.9 mmol/L (ref 3.5–5.3)
Sodium: 139 mmol/L (ref 135–146)
TOTAL PROTEIN: 7.1 g/dL (ref 6.1–8.1)

## 2015-08-28 LAB — CBC
HEMATOCRIT: 51.4 % — AB (ref 38.5–50.0)
HEMOGLOBIN: 17.5 g/dL — AB (ref 13.2–17.1)
MCH: 31.2 pg (ref 27.0–33.0)
MCHC: 34 g/dL (ref 32.0–36.0)
MCV: 91.6 fL (ref 80.0–100.0)
MPV: 12.7 fL — ABNORMAL HIGH (ref 7.5–12.5)
Platelets: 209 10*3/uL (ref 140–400)
RBC: 5.61 MIL/uL (ref 4.20–5.80)
RDW: 13.8 % (ref 11.0–15.0)
WBC: 8.3 10*3/uL (ref 3.8–10.8)

## 2015-08-28 LAB — TESTOSTERONE TOTAL,FREE,BIO, MALES
Albumin: 4.4 g/dL (ref 3.6–5.1)
SEX HORMONE BINDING: 30 nmol/L (ref 10–50)
Testosterone, Bioavailable: 256.6 ng/dL (ref 130.5–681.7)
Testosterone, Free: 127.5 pg/mL (ref 47.0–244.0)
Testosterone: 787 ng/dL (ref 250–827)

## 2015-08-28 LAB — T4, FREE: FREE T4: 1.2 ng/dL (ref 0.8–1.8)

## 2015-08-28 LAB — T3, FREE: T3, Free: 3.2 pg/mL (ref 2.3–4.2)

## 2015-08-28 NOTE — Progress Notes (Signed)
Quick Note:  Testosterone labs are still pending. Other labs look ok ______

## 2015-08-29 NOTE — Progress Notes (Signed)
Quick Note:  Testosterone levels are normal. Thyroid labs are improving. Overall labs look pretty okay. ______

## 2015-09-12 ENCOUNTER — Ambulatory Visit: Payer: BLUE CROSS/BLUE SHIELD | Admitting: Family Medicine

## 2015-09-12 ENCOUNTER — Encounter: Payer: Self-pay | Admitting: Family Medicine

## 2015-09-16 ENCOUNTER — Other Ambulatory Visit: Payer: Self-pay | Admitting: Family Medicine

## 2015-09-17 ENCOUNTER — Ambulatory Visit: Payer: BLUE CROSS/BLUE SHIELD | Admitting: Family Medicine

## 2015-10-02 ENCOUNTER — Ambulatory Visit (INDEPENDENT_AMBULATORY_CARE_PROVIDER_SITE_OTHER): Payer: BLUE CROSS/BLUE SHIELD | Admitting: Family Medicine

## 2015-10-02 ENCOUNTER — Encounter: Payer: Self-pay | Admitting: Family Medicine

## 2015-10-02 VITALS — BP 140/82 | HR 81 | Temp 98.2°F | Resp 16 | Ht 75.0 in | Wt 365.0 lb

## 2015-10-02 DIAGNOSIS — F329 Major depressive disorder, single episode, unspecified: Secondary | ICD-10-CM | POA: Diagnosis not present

## 2015-10-02 DIAGNOSIS — F458 Other somatoform disorders: Secondary | ICD-10-CM | POA: Diagnosis not present

## 2015-10-02 DIAGNOSIS — F32A Depression, unspecified: Secondary | ICD-10-CM

## 2015-10-02 DIAGNOSIS — J0110 Acute frontal sinusitis, unspecified: Secondary | ICD-10-CM

## 2015-10-02 MED ORDER — AZITHROMYCIN 250 MG PO TABS: 250.0000 mg | ORAL_TABLET | Freq: Every day | ORAL | 0 refills | Status: DC

## 2015-10-02 MED ORDER — PREDNISONE 10 MG PO TABS: 30.0000 mg | ORAL_TABLET | Freq: Every day | ORAL | 0 refills | Status: DC

## 2015-10-02 MED ORDER — SERTRALINE HCL 50 MG PO TABS: 50.0000 mg | ORAL_TABLET | Freq: Every day | ORAL | 1 refills | Status: DC

## 2015-10-02 NOTE — Progress Notes (Signed)
Tyler Hancock is a 35 y.o. male who presents to [facility] today for left facial pain. Patient notes a 3 day history of left-sided facial pain and pressure. He notes mild runny nose but otherwise feels well. No fevers chills nausea vomiting or diarrhea. He's tried some Tylenol and Motrin which helped a little. He does note that he tends to be clenching his teeth and jaw during the day. He's not sure if this is a new finding or not. He denies any change in hearing.  Additionally he notes that his anxiety has improved significantly. He takes sertraline as well as Wellbutrin. He's been attending counseling and feels much better.   Past Medical History:  Diagnosis Date  . Hypertension   . Thyroid disease    No past surgical history on file. Social History  Substance Use Topics  . Smoking status: Never Smoker  . Smokeless tobacco: Not on file  . Alcohol use 0.0 oz/week   family history includes Stroke in his other.  ROS as above:  Medications: Current Outpatient Prescriptions  Medication Sig Dispense Refill  . B-D 3CC LUER-LOK SYR 25GX1" 25G X 1" 3 ML MISC USE AS DIRECTED 10 each 0  . B-D HYPODERMIC NEEDLE 18GX1.5" 18G X 1-1/2" MISC USE AS DIRECTED 10 each 0  . buPROPion (WELLBUTRIN XL) 150 MG 24 hr tablet Take 1 tablet (150 mg total) by mouth every morning. 90 tablet 0  . buPROPion (WELLBUTRIN XL) 300 MG 24 hr tablet Take 1 tablet (300 mg total) by mouth daily. 90 tablet 2  . levothyroxine (SYNTHROID, LEVOTHROID) 175 MCG tablet Take 1 tablet (175 mcg total) by mouth daily. 90 tablet 1  . losartan (COZAAR) 100 MG tablet Take 1 tablet (100 mg total) by mouth daily. 90 tablet 1  . sertraline (ZOLOFT) 50 MG tablet Take 1 tablet (50 mg total) by mouth daily. 90 tablet 1  . SYRINGE DISPOSABLE 3CC 3 ML MISC Use 1 syringe per week with testosterone injection. 52 each 0  .  testosterone cypionate (DEPO-TESTOSTERONE) 200 MG/ML injection Inject 0.75 mLs (150 mg total) into the muscle every 7 (seven) days. 12 mL 0  . azithromycin (ZITHROMAX) 250 MG tablet Take 1 tablet (250 mg total) by mouth daily. Take first 2 tablets together, then 1 every day until finished. 6 tablet 0  . predniSONE (DELTASONE) 10 MG tablet Take 3 tablets (30 mg total) by mouth daily with breakfast. 15 tablet 0   No current facility-administered medications for this visit.    No Known Allergies   Exam:  BP 140/82 (BP Location: Right Arm, Patient Position: Sitting, Cuff Size: Large)   Pulse 81   Temp 98.2 F (36.8 C) (Oral)   Resp 16   Ht 6\' 3"  (1.905 m)   Wt (!) 365 lb (165.6 kg)   SpO2 98%   BMI 45.62 kg/m  Gen: Well NAD nontoxic appearing HEENT: EOMI,  MMM nontender maxillary frontal sinuses. Normal tympanic membranes bilaterally. No significant cervical lymphadenopathy. Nontender overlying the temple region and no jaw claudication symptoms are present. Lungs: Normal work of breathing. CTABL Heart: RRR no MRG Abd: NABS, Soft. Nondistended, Nontender Exts: Brisk capillary refill, warm and well perfused.  Psych: Alert and oriented normal speech thought process and affect. GAD 7 : Generalized Anxiety Score 10/02/2015  Nervous, Anxious, on Edge 1  Control/stop worrying 1  Worry too much - different things 1  Trouble relaxing 1  Restless  0  Easily annoyed or irritable 0  Afraid - awful might happen 0  Total GAD 7 Score 4  Anxiety Difficulty Not difficult at all      No results found for this or any previous visit (from the past 24 hour(s)). No results found.    Assessment and Plan: 35 y.o. male with  1) sinusitis: Likely viral or allergic. Recommend over-the-counter medications. If not better I have prescribed prednisone and azithromycin to use as a backup for bacterial sinusitis.  2) anxiety: Much improved. Continue current regimen. Recheck as needed.  3) jaw clenching:  If worsening or not improving recommend following up with a dentist for a bite guard.   No orders of the defined types were placed in this encounter.   Discussed warning signs or symptoms. Please see discharge instructions. Patient expresses understanding.

## 2015-10-02 NOTE — Patient Instructions (Signed)
Thank you for coming in today. Use over-the-counter cold or allergy sinus medications for current symptoms. If not better take prednisone and antibiotics. Continue Zoloft. Recommended seeing your dentist about teeth grinding and clenching for a bite guard if needed.   Sinusitis, Adult Sinusitis is redness, soreness, and inflammation of the paranasal sinuses. Paranasal sinuses are air pockets within the bones of your face. They are located beneath your eyes, in the middle of your forehead, and above your eyes. In healthy paranasal sinuses, mucus is able to drain out, and air is able to circulate through them by way of your nose. However, when your paranasal sinuses are inflamed, mucus and air can become trapped. This can allow bacteria and other germs to grow and cause infection. Sinusitis can develop quickly and last only a short time (acute) or continue over a long period (chronic). Sinusitis that lasts for more than 12 weeks is considered chronic. CAUSES Causes of sinusitis include:  Allergies.  Structural abnormalities, such as displacement of the cartilage that separates your nostrils (deviated septum), which can decrease the air flow through your nose and sinuses and affect sinus drainage.  Functional abnormalities, such as when the small hairs (cilia) that line your sinuses and help remove mucus do not work properly or are not present. SIGNS AND SYMPTOMS Symptoms of acute and chronic sinusitis are the same. The primary symptoms are pain and pressure around the affected sinuses. Other symptoms include:  Upper toothache.  Earache.  Headache.  Bad breath.  Decreased sense of smell and taste.  A cough, which worsens when you are lying flat.  Fatigue.  Fever.  Thick drainage from your nose, which often is green and may contain pus (purulent).  Swelling and warmth over the affected sinuses. DIAGNOSIS Your health care provider will perform a physical exam. During your exam,  your health care provider may perform any of the following to help determine if you have acute sinusitis or chronic sinusitis:  Look in your nose for signs of abnormal growths in your nostrils (nasal polyps).  Tap over the affected sinus to check for signs of infection.  View the inside of your sinuses using an imaging device that has a light attached (endoscope). If your health care provider suspects that you have chronic sinusitis, one or more of the following tests may be recommended:  Allergy tests.  Nasal culture. A sample of mucus is taken from your nose, sent to a lab, and screened for bacteria.  Nasal cytology. A sample of mucus is taken from your nose and examined by your health care provider to determine if your sinusitis is related to an allergy. TREATMENT Most cases of acute sinusitis are related to a viral infection and will resolve on their own within 10 days. Sometimes, medicines are prescribed to help relieve symptoms of both acute and chronic sinusitis. These may include pain medicines, decongestants, nasal steroid sprays, or saline sprays. However, for sinusitis related to a bacterial infection, your health care provider will prescribe antibiotic medicines. These are medicines that will help kill the bacteria causing the infection. Rarely, sinusitis is caused by a fungal infection. In these cases, your health care provider will prescribe antifungal medicine. For some cases of chronic sinusitis, surgery is needed. Generally, these are cases in which sinusitis recurs more than 3 times per year, despite other treatments. HOME CARE INSTRUCTIONS  Drink plenty of water. Water helps thin the mucus so your sinuses can drain more easily.  Use a humidifier.  Inhale steam  3-4 times a day (for example, sit in the bathroom with the shower running).  Apply a warm, moist washcloth to your face 3-4 times a day, or as directed by your health care provider.  Use saline nasal sprays to  help moisten and clean your sinuses.  Take medicines only as directed by your health care provider.  If you were prescribed either an antibiotic or antifungal medicine, finish it all even if you start to feel better. SEEK IMMEDIATE MEDICAL CARE IF:  You have increasing pain or severe headaches.  You have nausea, vomiting, or drowsiness.  You have swelling around your face.  You have vision problems.  You have a stiff neck.  You have difficulty breathing.   This information is not intended to replace advice given to you by your health care provider. Make sure you discuss any questions you have with your health care provider.   Document Released: 01/27/2005 Document Revised: 02/17/2014 Document Reviewed: 02/11/2011 Elsevier Interactive Patient Education [employer].

## 2015-10-10 ENCOUNTER — Telehealth: Payer: Self-pay

## 2015-10-10 MED ORDER — SERTRALINE HCL 100 MG PO TABS: 200.0000 mg | ORAL_TABLET | Freq: Every day | ORAL | 1 refills | Status: DC

## 2015-10-10 NOTE — Telephone Encounter (Signed)
200 mg is the max dose. Continue 200 mg daily. Recheck previously arranged or sooner if needed.

## 2015-10-10 NOTE — Telephone Encounter (Signed)
Notified patient of recommendation. 

## 2015-10-22 ENCOUNTER — Ambulatory Visit (INDEPENDENT_AMBULATORY_CARE_PROVIDER_SITE_OTHER): Payer: BLUE CROSS/BLUE SHIELD | Admitting: Family Medicine

## 2015-10-22 ENCOUNTER — Ambulatory Visit (INDEPENDENT_AMBULATORY_CARE_PROVIDER_SITE_OTHER): Payer: BLUE CROSS/BLUE SHIELD

## 2015-10-22 ENCOUNTER — Encounter: Payer: Self-pay | Admitting: Family Medicine

## 2015-10-22 VITALS — BP 127/90 | HR 77 | Wt 368.0 lb

## 2015-10-22 DIAGNOSIS — S338XXA Sprain of other parts of lumbar spine and pelvis, initial encounter: Secondary | ICD-10-CM | POA: Diagnosis not present

## 2015-10-22 DIAGNOSIS — S39012A Strain of muscle, fascia and tendon of lower back, initial encounter: Secondary | ICD-10-CM

## 2015-10-22 DIAGNOSIS — M545 Low back pain: Secondary | ICD-10-CM | POA: Diagnosis not present

## 2015-10-22 NOTE — Patient Instructions (Signed)
Thank you for coming in today. Come back or go to the emergency room if you notice new weakness new numbness problems walking or bowel or bladder problems. Attend PT.   TENS UNIT: This is helpful for muscle pain and spasm.   Search and Purchase a TENS 7000 2nd edition at www.tenspros.com or www.amazon.com It should be less than $30.     TENS unit instructions: Do not shower or bathe with the unit on Turn the unit off before removing electrodes or batteries If the electrodes lose stickiness add a drop of water to the electrodes after they are disconnected from the unit and place on plastic sheet. If you continued to have difficulty, call the TENS unit company to purchase more electrodes. Do not apply lotion on the skin area prior to use. Make sure the skin is clean and dry as this will help prolong the life of the electrodes. After use, always check skin for unusual red areas, rash or other skin difficulties. If there are any skin problems, does not apply electrodes to the same area. Never remove the electrodes from the unit by pulling the wires. Do not use the TENS unit or electrodes other than as directed. Do not change electrode placement without consultating your therapist or physician. Keep 2 fingers with between each electrode. Wear time ratio is 2:1, on to off times.    For example on for 30 minutes off for 15 minutes and then on for 30 minutes off for 15 minutes   Lumbosacral Strain Lumbosacral strain is a strain of any of the parts that make up your lumbosacral vertebrae. Your lumbosacral vertebrae are the bones that make up the lower third of your backbone. Your lumbosacral vertebrae are held together by muscles and tough, fibrous tissue (ligaments).  CAUSES  A sudden blow to your back can cause lumbosacral strain. Also, anything that causes an excessive stretch of the muscles in the low back can cause this strain. This is typically seen when people exert themselves strenuously,  fall, lift heavy objects, bend, or crouch repeatedly. RISK FACTORS  Physically demanding work.  Participation in pushing or pulling sports or sports that require a sudden twist of the back (tennis, golf, baseball).  Weight lifting.  Excessive lower back curvature.  Forward-tilted pelvis.  Weak back or abdominal muscles or both.  Tight hamstrings. SIGNS AND SYMPTOMS  Lumbosacral strain may cause pain in the area of your injury or pain that moves (radiates) down your leg.  DIAGNOSIS Your health care provider can often diagnose lumbosacral strain through a physical exam. In some cases, you may need tests such as X-ray exams.  TREATMENT  Treatment for your lower back injury depends on many factors that your clinician will have to evaluate. However, most treatment will include the use of anti-inflammatory medicines. HOME CARE INSTRUCTIONS   Avoid hard physical activities (tennis, racquetball, waterskiing) if you are not in proper physical condition for it. This may aggravate or create problems.  If you have a back problem, avoid sports requiring sudden body movements. Swimming and walking are generally safer activities.  Maintain good posture.  Maintain a healthy weight.  For acute conditions, you may put ice on the injured area.  Put ice in a plastic bag.  Place a towel between your skin and the bag.  Leave the ice on for 20 minutes, 2-3 times a day.  When the low back starts healing, stretching and strengthening exercises may be recommended. SEEK MEDICAL CARE IF:  Your back  pain is getting worse.  You experience severe back pain not relieved with medicines. SEEK IMMEDIATE MEDICAL CARE IF:   You have numbness, tingling, weakness, or problems with the use of your arms or legs.  There is a change in bowel or bladder control.  You have increasing pain in any area of the body, including your belly (abdomen).  You notice shortness of breath, dizziness, or feel  faint.  You feel sick to your stomach (nauseous), are throwing up (vomiting), or become sweaty.  You notice discoloration of your toes or legs, or your feet get very cold. MAKE SURE YOU:   Understand these instructions.  Will watch your condition.  Will get help right away if you are not doing well or get worse.   This information is not intended to replace advice given to you by your health care provider. Make sure you discuss any questions you have with your health care provider.   Document Released: 11/06/2004 Document Revised: 02/17/2014 Document Reviewed: 09/15/2012 Elsevier Interactive Patient Education [employer].

## 2015-10-23 NOTE — Progress Notes (Signed)
Tyler Hancock is a 35 y.o. male who presents to [facility] today for back pain. Patient is cute onset of left low back pain for the last week or so. Pain is consistent with previous episodes of low back strain. Previously he is done well physical therapy. He's been tried some home therapy exercises that it helped a little. Additionally he's been trying over-the-counter medicines which also helped a little. Previously he notes that muscle relaxers have not been helpful. He denies significant radiating pain weakness or numbness bowel bladder dysfunction. He denies any fevers or chills and feels well otherwise. Pain is severe and interfering with activity.   Past Medical History:  Diagnosis Date  . Hypertension   . Thyroid disease    No past surgical history on file. Social History  Substance Use Topics  . Smoking status: Never Smoker  . Smokeless tobacco: Not on file  . Alcohol use 0.0 oz/week   family history includes Stroke in his other.  ROS as above:  Medications: Current Outpatient Prescriptions  Medication Sig Dispense Refill  . azithromycin (ZITHROMAX) 250 MG tablet Take 1 tablet (250 mg total) by mouth daily. Take first 2 tablets together, then 1 every day until finished. 6 tablet 0  . B-D 3CC LUER-LOK SYR 25GX1" 25G X 1" 3 ML MISC USE AS DIRECTED 10 each 0  . B-D HYPODERMIC NEEDLE 18GX1.5" 18G X 1-1/2" MISC USE AS DIRECTED 10 each 0  . buPROPion (WELLBUTRIN XL) 150 MG 24 hr tablet Take 1 tablet (150 mg total) by mouth every morning. 90 tablet 0  . buPROPion (WELLBUTRIN XL) 300 MG 24 hr tablet Take 1 tablet (300 mg total) by mouth daily. 90 tablet 2  . levothyroxine (SYNTHROID, LEVOTHROID) 175 MCG tablet Take 1 tablet (175 mcg total) by mouth daily. 90 tablet 1  . losartan (COZAAR) 100 MG tablet Take 1 tablet (100 mg total) by mouth daily. 90 tablet 1  .  predniSONE (DELTASONE) 10 MG tablet Take 3 tablets (30 mg total) by mouth daily with breakfast. 15 tablet 0  . sertraline (ZOLOFT) 100 MG tablet Take 2 tablets (200 mg total) by mouth daily. 60 tablet 1  . SYRINGE DISPOSABLE 3CC 3 ML MISC Use 1 syringe per week with testosterone injection. 52 each 0  . testosterone cypionate (DEPO-TESTOSTERONE) 200 MG/ML injection Inject 0.75 mLs (150 mg total) into the muscle every 7 (seven) days. 12 mL 0   No current facility-administered medications for this visit.    No Known Allergies   Exam:  BP 127/90   Pulse 77   Wt (!) 368 lb (166.9 kg)   BMI 46.00 kg/m  Gen: Well NAD HEENT: EOMI,  MMM Lungs: Normal work of breathing. CTABL Heart: RRR no MRG Abd: NABS, Soft. Nondistended, Nontender Exts: Brisk capillary refill, warm and well perfused.  MSK: Nontender to spinal midline. Tender palpation left lumbar spine. Normal motion. Largely strength is equal normal throughout. Normal gait.  No results found for this or any previous visit (from the past 24 hour(s)). No results found.    Assessment and Plan: 35 y.o. male with lumbosacral strain. X-ray pending. Refer to physical therapy. Continue over-the-counter medications heating pad and TENS unit. Return a few weeks if not better.  Orders Placed This Encounter  Procedures  . DG Lumbar Spine Complete    Standing Status:   Future    Number of Occurrences:   1  Standing Expiration Date:   12/21/2016    Order Specific Question:   Reason for Exam (SYMPTOM  OR DIAGNOSIS REQUIRED)    Answer:   left lumbar pain    Order Specific Question:   Preferred imaging location?    Answer:   [facility]  . Ambulatory referral to Physical Therapy    Referral Priority:   Routine    Referral Type:   Physical Medicine    Referral Reason:   Specialty Services Required    Requested Specialty:   Physical Therapy    Number of Visits Requested:   1    Discussed warning signs or symptoms. Please see  discharge instructions. Patient expresses understanding.

## 2015-10-29 ENCOUNTER — Ambulatory Visit (INDEPENDENT_AMBULATORY_CARE_PROVIDER_SITE_OTHER): Payer: BLUE CROSS/BLUE SHIELD | Admitting: Rehabilitative and Restorative Service Providers"

## 2015-10-29 ENCOUNTER — Encounter: Payer: Self-pay | Admitting: Rehabilitative and Restorative Service Providers"

## 2015-10-29 DIAGNOSIS — M545 Low back pain: Secondary | ICD-10-CM | POA: Diagnosis not present

## 2015-10-29 DIAGNOSIS — R29898 Other symptoms and signs involving the musculoskeletal system: Secondary | ICD-10-CM | POA: Diagnosis not present

## 2015-10-29 NOTE — Therapy (Addendum)
[place] [address] [place] [place], [place], [address] Phone: [phone]   Fax:  [phone]  Physical Therapy Evaluation  Patient Details  Name: Tyler Hancock MRN: [id number] Date of Birth: 1980-09-11 Referring Provider: Dr Georgina Snell   Encounter Date: 10/29/2015      PT End of Session - 10/29/15 1207    Visit Number 1   Number of Visits 12   Date for PT Re-Evaluation 12/10/15   PT Start Time 1109   PT Stop Time 1151   PT Time Calculation (min) 42 min   Activity Tolerance Patient tolerated treatment well      Past Medical History:  Diagnosis Date  . Hypertension   . Thyroid disease     History reviewed. No pertinent surgical history.  There were no vitals filed for this visit.       Subjective Assessment - 10/29/15 1114    Subjective Episodic LBP for the the past 1 1/2 to 2 years with no known cause. Had PT about 1 1/2 years ago. No longer doing HEP. Recent flare up occured about 2 weeks ago - started one morning when he awoke. Some improvement when he was leaning over helping daughter into car seat - increased pain which lasted for 3-4 days and has gradually subsided.    Pertinent History fracture of arm and leg but does not remember when or which limbs   How long can you sit comfortably? no limit   How long can you stand comfortably? no limit   How long can you walk comfortably? no limit   Diagnostic tests xrays    Patient Stated Goals keep back pai nfrom returning    Currently in Pain? No/denies   Pain Location Back   Pain Orientation Left;Lower   Pain Type Chronic pain   Pain Radiating Towards mid buttock to back of Lt LE to knee    Pain Onset More than a month ago   Pain Frequency Intermittent   Aggravating Factors  unknown   Pain Relieving Factors time - walking(sitting makes it worse)            [facility] PT Assessment - 10/29/15 0001      Assessment   Medical Diagnosis Lumbosacral strain     Referring Provider Dr Georgina Snell    Onset Date/Surgical Date 10/14/15   Hand Dominance Right   Next MD Visit as needed    Prior Therapy yes 1 1/2 yrs ago      Precautions   Precautions None     Balance Screen   Has the patient fallen in the past 6 months No   Has the patient had a decrease in activity level because of a fear of falling?  No   Is the patient reluctant to leave their home because of a fear of falling?  No     Observation/Other Assessments   Focus on Therapeutic Outcomes (FOTO)  6% limitation      Sensation   Additional Comments WFL's per pt report      Posture/Postural Control   Posture Comments head forward; shoulders rounded; increased thoracic kyphosis;     AROM   Overall AROM Comments bilat tightness through hips    Lumbar Flexion 85%   Lumbar Extension 60%   Lumbar - Right Side Bend 70%   Lumbar - Left Side Bend 75%   Lumbar - Right Rotation 45%   Lumbar - Left Rotation 45%     Strength   Overall Strength  Comments 5/5 bilat LE's      Flexibility   Hamstrings tight Rt 75 deg; Lt 70%   Quadriceps tight bilat ~ 90-95 deg    ITB tight Lt > Rt    Piriformis tight Lt > Rt      Palpation   Spinal mobility CPA and UPA lumbar mobs painfree    Palpation comment tightness noted Lt hip flexors - iliopsoas and sartioris;  buttock/piriformis area                    [facility] Adult PT Treatment/Exercise - 10/29/15 0001      Self-Care   Self-Care --  initated spine care      Lumbar Exercises: Stretches   Passive Hamstring Stretch 3 reps;30 seconds   Hip Flexor Stretch 2 reps;30 seconds   Press Ups --  2-3 sec hold x 10    Quad Stretch 2 reps;30 seconds   ITB Stretch 2 reps;30 seconds   Piriformis Stretch 2 reps;30 seconds  supine knee to opposite shoulder      Lumbar Exercises: Supine   Ab Set --  3 part core 10 sec x 10                 PT Education - 10/29/15 1157    Education provided Yes   Education Details HEP back care initiated    [employer]) Educated Patient   Methods Explanation;Demonstration;Tactile cues;Verbal cues;Handout   Comprehension Verbalized understanding;Returned demonstration;Verbal cues required;Tactile cues required             PT Long Term Goals - 10/29/15 1212      PT LONG TERM GOAL #1   Title Increase ROM and mobilty through bilat hips with patient to demo increased tissue extensibility and ROM with specific stretches 12/10/15   Time 6   Period Weeks   Status New     PT LONG TERM GOAL #2   Title Improve core stability with patient to demo core stabilization exercise proramwith core engaged 12/10/15   Time 6   Period Weeks   Status New     PT LONG TERM GOAL #3   Title Independent in HEP 12/10/15   Time 6   Period Weeks   Status New     PT LONG TERM GOAL #4   Title FOTO =/< 6-11% limitation    Time 6   Period Weeks   Status New               Plan - 10/29/15 1208    Clinical Impression Statement Patient presents with chronic recurrent LBP with pain radiating into the Lt buttock to posterior thigh to knee. He is currently pain free but would like to find out what causes episodic flare ups of LBP and Lt LE pain and learn exercises to prevent recurrent problems. Tyler Hancock has limited trunk and LE mobilty and ROM; tightness through the hip flexors, hamstrings, piriformis musculature Lt > Rt; tightness and tenderness to palpation through the Lt hip flexors.    Rehab Potential Good   PT Frequency 2x / week   PT Duration 6 weeks   PT Treatment/Interventions Patient/family education;ADLs/Self Care Home Management;Cryotherapy;Electrical Stimulation;Iontophoresis 90m/ml Dexamethasone;Moist Heat;Traction;Ultrasound;Dry needling;Manual techniques;Therapeutic activities;Therapeutic exercise   PT Next Visit Plan review stretching for LE's; add ball release work for hip flexors; progress with back education and core stabilization    Consulted and Agree with Plan of Care Patient       Patient will benefit from skilled therapeutic  intervention in order to improve the following deficits and impairments:  Postural dysfunction, Pain, Obesity, Decreased range of motion, Decreased mobility, Increased fascial restricitons  Visit Diagnosis: Left low back pain, with sciatica presence unspecified - Plan: PT plan of care cert/re-cert  Other symptoms and signs involving the musculoskeletal system - Plan: PT plan of care cert/re-cert     Problem List Patient Active Problem List   Diagnosis Date Noted  . Teeth grinding 10/02/2015  . Family history of colon cancer 08/22/2015  . Abnormal weight gain 05/01/2015  . Migraine variant 02/22/2015  . Clinical depression 06/13/2014  . Essential (primary) hypertension 06/13/2014  . Personal history of other endocrine, nutritional and metabolic disease 17/47/1595  . 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 Alimentary obesity 06/13/2014  . Hypotestosteronism 06/13/2014    Khadejah Son Nilda Simmer PT, MPH  10/29/2015, 12:17 PM  [facility] [place], [place], [address] Phone: [phone]   Fax:  [phone]  Name: Tyler Hancock MRN: [id number] Date of Birth: 1980/04/06   PHYSICAL THERAPY DISCHARGE SUMMARY  Visits from Start of Care: Eval only  Current functional level related to goals / functional outcomes: unknown   Remaining deficits: unknown   Education / Equipment: initial HEP Plan: Patient agrees to discharge.  Patient goals were not met. Patient is being discharged due to not returning since the last visit.  ?????     Emoni Yang P. Helene Kelp PT, MPH 01/08/16 9:59 AM

## 2015-10-29 NOTE — Patient Instructions (Signed)
Abdominal Bracing With Pelvic Floor (Hook-Lying)    With neutral spine, tighten pelvic floor and abdominals SUCKING BELLY BUTTON TO BACK BONE; TIGHTEN MUSCLES IN LOW BACK AT WAIST.  Hold 10 sec Repeat _10__ times. Do _several __ times a day. Progress to sitting standing walking and with functional activities.   Trunk: Prone Extension (Press-Ups)    Lie on stomach on firm, flat surface. Relax bottom and legs. Raise chest in air with elbows straight. Keep hips flat on surface, sag stomach. Hold __2-3__ seconds. Repeat _10___ times. Do ____ sessions per day. CAUTION: Movement should be gentle and slow.   HIP: Hamstrings - Supine   Place strap around foot. Raise leg up, keeping knee straight.  Bend opposite knee to protect back if indicated. Hold 30 seconds. 3 reps per set, 2-3 sets per day     Outer Hip Stretch: Reclined IT Band Stretch (Strap)   Strap around one foot, pull leg across body until you feel a pull or stretch, with shoulders on mat. Hold for 30 seconds. Repeat 3 times each leg. 2-3 times/day.  Piriformis Stretch   Lying on back, pull right knee toward opposite shoulder. Hold 30 seconds. Repeat 3 times. Do 2-3 sessions per day.   Quads / HF, Supine   Lie near edge of bed, pull both knees up toward chest. Hold one knee as you drop the other leg off the edge of the bed.  Relax hanging knee/can bend knee back if indicated. Hold 30 seconds. Repeat 3 times per session. Do 2-3 sessions per day.    Quads / HF, Prone   Lie face down. Grasp one ankle with same-side hand. Use towel if needed to reach. Gently pull foot toward buttock.  Hold 30 seconds. Repeat 3 times per session. Do 2-3 sessions per day.  Sleeping on Back  Place pillow under knees. A pillow with cervical support and a roll around waist are also helpful. Copyright  [employer]. All rights reserved.  Sleeping on Side Place pillow between knees. Use cervical support under neck and a roll around waist as  needed. Copyright  [employer]. All rights reserved.   Sleeping on Stomach   If this is the only desirable sleeping position, place pillow under lower legs, and under stomach or chest as needed.  Posture - Sitting   Sit upright, head facing forward. Try using a roll to support lower back. Keep shoulders relaxed, and avoid rounded back. Keep hips level with knees. Avoid crossing legs for long periods. Stand to Sit / Sit to Stand   To sit: Bend knees to lower self onto front edge of chair, then scoot back on seat. To stand: Reverse sequence by placing one foot forward, and scoot to front of seat. Use rocking motion to stand up.   Work Height and Reach  Ideal work height is no more than 2 to 4 inches below elbow level when standing, and at elbow level when sitting. Reaching should be limited to arm's length, with elbows slightly bent.  Bending  Bend at hips and knees, not back. Keep feet shoulder-width apart.    Posture - Standing   Good posture is important. Avoid slouching and forward head thrust. Maintain curve in low back and align ears over shoul- ders, hips over ankles.  Alternating Positions   Alternate tasks and change positions frequently to reduce fatigue and muscle tension. Take rest breaks. Computer Work   Position work to Art gallery manager. Use proper work and seat height. Keep shoulders back  and down, wrists straight, and elbows at right angles. Use chair that provides full back support. Add footrest and lumbar roll as needed.  Getting Into / Out of Car  Lower self onto seat, scoot back, then bring in one leg at a time. Reverse sequence to get out.  Dressing  Lie on back to pull socks or slacks over feet, or sit and bend leg while keeping back straight.    Housework - Sink  Place one foot on ledge of cabinet under sink when standing at sink for prolonged periods.   Pushing / Pulling  Pushing is preferable to pulling. Keep back in proper alignment, and use leg  muscles to do the work.  Deep Squat   Squat and lift with both arms held against upper trunk. Tighten stomach muscles without holding breath. Use smooth movements to avoid jerking.  Avoid Twisting   Avoid twisting or bending back. Pivot around using foot movements, and bend at knees if needed when reaching for articles.  Carrying Luggage   Distribute weight evenly on both sides. Use a cart whenever possible. Do not twist trunk. Move body as a unit.   Lifting Principles .Maintain proper posture and head alignment. .Slide object as close as possible before lifting. .Move obstacles out of the way. .Test before lifting; ask for help if too heavy. .Tighten stomach muscles without holding breath. .Use smooth movements; do not jerk. .Use legs to do the work, and pivot with feet. .Distribute the work load symmetrically and close to the center of trunk. .Push instead of pull whenever possible.   Ask For Help   Ask for help and delegate to others when possible. Coordinate your movements when lifting together, and maintain the low back curve.  Log Roll   Lying on back, bend left knee and place left arm across chest. Roll all in one movement to the right. Reverse to roll to the left. Always move as one unit. Housework - Sweeping  Use long-handled equipment to avoid stooping.   Housework - Wiping  Position yourself as close as possible to reach work surface. Avoid straining your back.  Laundry - Unloading Wash   To unload small items at bottom of washer, lift leg opposite to arm being used to reach.  Robbins close to area to be raked. Use arm movements to do the work. Keep back straight and avoid twisting.     Cart  When reaching into cart with one arm, lift opposite leg to keep back straight.   Getting Into / Out of Bed  Lower self to lie down on one side by raising legs and lowering head at the same time. Use arms to assist moving without twisting.  Bend both knees to roll onto back if desired. To sit up, start from lying on side, and use same move-ments in reverse. Housework - Vacuuming  Hold the vacuum with arm held at side. Step back and forth to move it, keeping head up. Avoid twisting.   Laundry - IT consultant so that bending and twisting can be avoided.   Laundry - Unloading Dryer  Squat down to reach into clothes dryer or use a reacher.  Gardening - Weeding / Probation officer or Kneel. Knee pads may be helpful.

## 2015-11-01 ENCOUNTER — Other Ambulatory Visit: Payer: Self-pay | Admitting: Family Medicine

## 2015-11-08 ENCOUNTER — Encounter: Payer: BLUE CROSS/BLUE SHIELD | Admitting: Physical Therapy

## 2015-11-15 ENCOUNTER — Encounter: Payer: BLUE CROSS/BLUE SHIELD | Admitting: Physical Therapy

## 2015-11-19 ENCOUNTER — Other Ambulatory Visit: Payer: Self-pay | Admitting: Family Medicine

## 2015-11-29 ENCOUNTER — Other Ambulatory Visit: Payer: Self-pay | Admitting: Family Medicine

## 2015-12-19 ENCOUNTER — Encounter: Payer: Self-pay | Admitting: Family Medicine

## 2015-12-19 ENCOUNTER — Ambulatory Visit (INDEPENDENT_AMBULATORY_CARE_PROVIDER_SITE_OTHER): Payer: BLUE CROSS/BLUE SHIELD | Admitting: Family Medicine

## 2015-12-19 VITALS — BP 159/93 | HR 83 | Wt 356.0 lb

## 2015-12-19 DIAGNOSIS — F411 Generalized anxiety disorder: Secondary | ICD-10-CM | POA: Diagnosis not present

## 2015-12-19 DIAGNOSIS — F331 Major depressive disorder, recurrent, moderate: Secondary | ICD-10-CM

## 2015-12-19 DIAGNOSIS — M7671 Peroneal tendinitis, right leg: Secondary | ICD-10-CM | POA: Diagnosis not present

## 2015-12-19 HISTORY — DX: Generalized anxiety disorder: F41.1

## 2015-12-19 MED ORDER — BUSPIRONE HCL 5 MG PO TABS: 5.0000 mg | ORAL_TABLET | Freq: Three times a day (TID) | ORAL | 0 refills | Status: DC

## 2015-12-19 NOTE — Patient Instructions (Signed)
Thank you for coming in today. Try using buspar up to 3x daily.  Return in 2-4 weeks for recheck  Try to break up your walk into two 30 minute segments.  You should hear from psychiatry.     Peroneal Tendinitis With Rehab Tendonitis is inflammation of a tendon. Inflammation of the tendons on the back of the outer ankle (peroneal tendons) is known as peroneal tendonitis. The peroneal tendons are responsible for connecting the muscles that allow you to stand on your tiptoes to the bones of the ankle. For this reason, peroneal tendonitis often causes pain when trying to complete such motions. Peroneal tendonitis often involves a tear (strain) of the peroneal tendons. Strains are classified into three categories. Grade 1 strains cause pain, but the tendon is not lengthened. Grade 2 strains include a lengthened ligament, due to the ligament being stretched or partially ruptured. With grade 2 strains there is still function, although function may be decreased. Grade 3 strains involve a complete tear of the tendon or muscle, and function is usually impaired. SYMPTOMS   Pain, tenderness, swelling, warmth, or redness over the back of the outer side of the ankle, the outer part of the mid-foot, or the bottom of the arch.  Pain that gets worse with ankle motion (especially when pushing off or pushing down with the front of the foot), or when standing on the ball of the foot or pushing the foot outward.  Crackling sound (crepitation) when the tendon is moved or touched. CAUSES  Peroneal tendinitis occurs when injury to the peroneal tendons causes the body to respond with inflammation. Common causes of injury include:  An overuse injury, in which the groove behind the outer ankle (where the tendon is located) causes wear on the tendon.  A sudden stress placed on the tendon, such as from an increase in the intensity, frequency, or duration of training.  Direct hit (trauma) to the tendon.  Return to  activity too soon after a previous ankle injury. RISK INCREASES WITH:  Sports that require sudden, repetitive pushing off of the foot, such as jumping or quick starts.  Kicking and running sports, especially running down hills or long distances.  Poor strength and flexibility.  Previous injury to the foot, ankle, or leg. PREVENTION  Warm up and stretch properly before activity.  Allow for adequate recovery between workouts.  Maintain physical fitness:  Strength, flexibility, and endurance.  Cardiovascular fitness.  Complete rehabilitation after previous injury. PROGNOSIS  If treated properly, peroneal tendonitis usually heals within 6 weeks.  RELATED COMPLICATIONS  Longer healing time, if not properly treated or if not given enough time to heal.  Recurring symptoms if activity is resumed too soon, with overuse, or when using poor technique.  If untreated, tendinitis may result in tendon rupture, requiring surgery. TREATMENT  Treatment first involves the use of ice and medicine to reduce pain and inflammation. The use of strengthening and stretching exercises may help reduce pain with activity. These exercises may be performed at unsuccessful, surgery to remove the inflamed tendon lining (sheath) may be advised.  MEDICATION   If pain medicine is needed, nonsteroidal anti-inflammatory medicines (aspirin and ibuprofen), or other minor pain relievers (acetaminophen), are often advised.  Do not take pain medicine for 7 days before surgery.  Prescription pain relievers may be given, if your caregiver thinks they are needed. Use only as directed and only as much as you need. HEAT AND COLD  Cold treatment (icing) should be applied for 10  to 15 minutes every 2 to 3 hours for inflammation and pain, and immediately after activity that aggravates your symptoms. Use ice packs or an ice massage.  Heat treatment may be used before performing stretching and strengthening activities  prescribed by your caregiver, physical therapist, or athletic trainer. Use a heat pack or a warm water soak. SEEK MEDICAL CARE IF:  Symptoms get worse or do not improve in 2 to 4 weeks, despite treatment.  New, unexplained symptoms develop. (Drugs used in treatment may produce side effects.) EXERCISES RANGE OF MOTION (ROM) AND STRETCHING EXERCISES - Peroneal Tendinitis These exercises may help you when beginning to rehabilitate your injury. Your symptoms may resolve with or without further involvement from your physician, physical therapist or athletic trainer. While completing these exercises, remember:   Restoring tissue flexibility helps normal motion to return to the joints. This allows healthier, less painful movement and activity.  An effective stretch should be held for at least 30 seconds.  A stretch should never be painful. You should only feel a gentle lengthening or release in the stretched tissue. RANGE OF MOTION - Ankle Eversion  Sit with your right / left ankle crossed over your opposite knee.  Grip your foot with your opposite hand, placing your thumb on the top of your foot and your fingers across the bottom of your foot.  Gently push your foot downward with a slight rotation, so your littlest toes rise slightly toward the ceiling.  You should feel a gentle stretch on the inside of your ankle. Hold the stretch for __________ seconds. Repeat __________ times. Complete this exercise __________ times per day.  RANGE OF MOTION - Ankle Inversion  Sit with your right / left ankle crossed over your opposite knee.  Grip your foot with your opposite hand, placing your thumb on the bottom of your foot and your fingers across the top of your foot.  Gently pull your foot so the smallest toe comes toward you and your thumb pushes the inside of the ball of your foot away from you.  You should feel a gentle stretch on the outside of your ankle. Hold the stretch for __________  seconds. Repeat __________ times. Complete this exercise __________ times per day.  RANGE OF MOTION - Ankle Plantar Flexion  Sit with your right / left leg crossed over your opposite knee.  Use your opposite hand to pull the top of your foot and toes toward you.  You should feel a gentle stretch on the top of your foot and ankle. Hold this position for __________ seconds. Repeat __________ times. Complete __________ times per day.  STRETCH - Gastroc, Standing  Place your hands on a wall.  Extend your right / left leg behind you, keeping the front knee somewhat bent.  Slightly point your toes inward on your back foot.  Keeping your right / left heel on the floor and your knee straight, shift your weight toward the wall, not allowing your back to arch.  You should feel a gentle stretch in the calf. Hold this position for __________ seconds. Repeat __________ times. Complete this stretch __________ times per day. STRETCH - Soleus, Standing  Place your hands on a wall.  Extend your right / left leg behind you, keeping the other knee somewhat bent.  Slightly point your toes inward on your back foot.  Keep your heel on the floor, bend your back knee, and slightly shift your weight over the back leg so that you feel a gentle  stretch deep in your back calf.  Hold this position for __________ seconds. Repeat __________ times. Complete this stretch __________ times per day. STRETCH - Gastrocsoleus, Standing Note: This exercise can place a lot of stress on your foot and ankle. Please complete this exercise only if specifically instructed by your caregiver.   Place the ball of your right / left foot on a step, keeping your other foot firmly on the same step.  Hold on to the wall or a rail for balance.  Slowly lift your other foot, allowing your body weight to press your heel down over the edge of the step.  You should feel a stretch in your right / left calf.  Hold this position for  __________ seconds.  Repeat this exercise with a slight bend in your knee. Repeat __________ times. Complete this stretch __________ times per day.  STRENGTHENING EXERCISES - Peroneal Tendinitis  These exercises may help you when beginning to rehabilitate your injury. They may resolve your symptoms with or without further involvement from your physician, physical therapist or athletic trainer. While completing these exercises, remember:   Muscles can gain both the endurance and the strength needed for everyday activities through controlled exercises.  Complete these exercises as instructed by your physician, physical therapist or athletic trainer. Increase the resistance and repetitions only as guided by your caregiver. STRENGTH - Dorsiflexors  Secure a rubber exercise band or tubing to a fixed object (table, pole) and loop the other end around your right / left foot.  Sit on the floor facing the fixed object. The band should be slightly tense when your foot is relaxed.  Slowly draw your foot back toward you, using your ankle and toes.  Hold this position for __________ seconds. Slowly release the tension in the band and return your foot to the starting position. Repeat __________ times. Complete this exercise __________ times per day.  STRENGTH - Towel Curls  Sit in a chair, on a non-carpeted surface.  Place your foot on a towel, keeping your heel on the floor.  Pull the towel toward your heel only by curling your toes. Keep your heel on the floor.  If instructed by your physician, physical therapist or athletic trainer, add weight to the end of the towel. Repeat __________ times. Complete this exercise __________ times per day. STRENGTH - Ankle Eversion   Secure one end of a rubber exercise band or tubing to a fixed object (table, pole). Loop the other end around your foot, just before your toes.  Place your fists between your knees. This will focus your strengthening at your  ankle.  Drawing the band across your opposite foot, away from the pole, slowly, pull your little toe out and up. Make sure the band is positioned to resist the entire motion.  Hold this position for __________ seconds.  Have your muscles resist the band, as it slowly pulls your foot back to the starting position. Repeat __________ times. Complete this exercise __________ times per day.    This information is not intended to replace advice given to you by your health care provider. Make sure you discuss any questions you have with your health care provider.   Document Released: 01/27/2005 Document Revised: 06/13/2014 Document Reviewed: 05/11/2008 Elsevier Interactive Patient Education [employer].

## 2015-12-19 NOTE — Progress Notes (Signed)
Tyler Hancock is a 35 y.o. male who presents to [facility] [address] today for depression, anxiety and right ankle pain.   1) Depression/Anxiety: He sleeps pretty well controlled with sertraline bupropion and counseling. He notes his job stressors have increased significantly. He finds work very challenging and demanding. He would like to leave of absence from work while he figures what his life goals out our. He notes significant trouble with motivation and sleep and excessive worrying. He tolerates his medications well and thinks they do help.  2) right ankle pain: Patient has been exercising more and effort to lose weight as well as to improve his mental health. He walks approximately 40-50 minutes 3-4 days a week. He notes when he increased his walking from 30 minutes to 45 minutes she developed right lateral ankle pain. Symptoms are moderate with occasional limping. He is worried that the pain will interfere with his ability to work or continue to exercise. Over-the-counter medications which helped a little.   Past Medical History:  Diagnosis Date  . 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 GAD (generalized anxiety disorder) 12/19/2015  . Hypertension   . Hypotestosteronism 06/13/2014  . Major depression 06/13/2014  . Morbid obesity ([place]) 06/13/2014  . Thyroid disease    No past surgical history on file. Social History  Substance Use Topics  . Smoking status: Never Smoker  . Smokeless tobacco: Not on file  . Alcohol use 0.0 oz/week   family history includes Stroke in his other.  ROS as above:  Medications: Current Outpatient Prescriptions  Medication Sig Dispense Refill  . B-D 3CC LUER-LOK SYR 25GX1" 25G X 1" 3 ML MISC USE AS DIRECTED 10 each 0  . B-D HYPODERMIC NEEDLE 18GX1.5" 18G X 1-1/2" MISC USE AS DIRECTED 10 each 0  . buPROPion (WELLBUTRIN XL) 150 MG 24 hr tablet  Take 1 tablet (150 mg total) by mouth every morning. 90 tablet 0  . buPROPion (WELLBUTRIN XL) 300 MG 24 hr tablet Take 1 tablet (300 mg total) by mouth daily. 90 tablet 2  . levothyroxine (SYNTHROID, LEVOTHROID) 175 MCG tablet TAKE 1 TABLET BY MOUTH DAILY 90 tablet 0  . losartan (COZAAR) 100 MG tablet Take 1 tablet (100 mg total) by mouth daily. 90 tablet 1  . sertraline (ZOLOFT) 100 MG tablet TAKE 2 TABLETS(200 MG) BY MOUTH DAILY 60 tablet 0  . SYRINGE DISPOSABLE 3CC 3 ML MISC Use 1 syringe per week with testosterone injection. 52 each 0  . testosterone cypionate (DEPO-TESTOSTERONE) 200 MG/ML injection Inject 0.75 mLs (150 mg total) into the muscle every 7 (seven) days. 12 mL 0  . busPIRone (BUSPAR) 5 MG tablet Take 1 tablet (5 mg total) by mouth 3 (three) times daily. 90 tablet 0   No current facility-administered medications for this visit.    No Known Allergies  Health Maintenance Health Maintenance  Topic Date Due  . HIV Screening  07/19/1997  . INFLUENZA VACCINE  04/10/2016 (Originally 09/11/2015)  . TETANUS/TDAP  03/29/2018     Exam:  BP (!) 159/93   Pulse 83   Wt (!) 356 lb (161.5 kg)   BMI 44.50 kg/m  Gen: Well NAD obese HEENT: EOMI,  MMM Lungs: Normal work of breathing. CTABL Heart: RRR no MRG Abd: NABS, Soft. Nondistended, Nontender Exts: Brisk capillary refill, warm and well perfused.  Right ankle: Normal-appearing. Mildly tender palpation along the course of the peroneal tendons posterior to the lateral malleolus. Normal ankle  motion. Mild pain with resisted ankle eversion. Pulses capillary refill and sensation intact distally. Psych: Alert and oriented. Slightly flattened affect. Normal speech and thought process.  Depression screen [facility] 2/9 12/19/2015 08/22/2015  Decreased Interest 2 2  Down, Depressed, Hopeless 2 3  PHQ - 2 Score 4 5  Altered sleeping 2 1  Tired, decreased energy 2 3  Change in appetite 2 2  Feeling bad or failure about yourself  1 2  Trouble  concentrating 3 0  Moving slowly or fidgety/restless 3 1  Suicidal thoughts 0 0  PHQ-9 Score 17 14  Difficult doing work/chores Extremely dIfficult Very difficult   GAD 7 : Generalized Anxiety Score 12/19/2015 10/02/2015  Nervous, Anxious, on Edge 2 1  Control/stop worrying 2 1  Worry too much - different things 2 1  Trouble relaxing 1 1  Restless 2 0  Easily annoyed or irritable 2 0  Afraid - awful might happen 2 0  Total GAD 7 Score 13 4  Anxiety Difficulty Extremely difficult Not difficult at all       No results found for this or any previous visit (from the past 72 hour(s)). No results found.    Assessment and Plan: 35 y.o. male with  Anxiety and depression: Worsening. Doses of medication appear to be near maximum of Zoloft and bupropion. Plan to refer to psychiatry and continue counseling. Also start BuSpar and obtain leave of absence. Work note provided. Will fill out FMLA paperwork. Recheck in a few weeks.  Right ankle pain: Mild peroneal tendinitis. Plan for home exercise program of conservative management. Recheck in a few weeks.   Orders Placed This Encounter  Procedures  . Ambulatory referral to Psychiatry    Referral Priority:   Routine    Referral Type:   Psychiatric    Referral Reason:   Specialty Services Required    Referred to Provider:   Merian Capron, MD    Requested Specialty:   Psychiatry    Number of Visits Requested:   1    Discussed warning signs or symptoms. Please see discharge instructions. Patient expresses understanding.

## 2015-12-20 ENCOUNTER — Encounter: Payer: Self-pay | Admitting: Family Medicine

## 2015-12-20 ENCOUNTER — Ambulatory Visit (INDEPENDENT_AMBULATORY_CARE_PROVIDER_SITE_OTHER): Payer: BLUE CROSS/BLUE SHIELD | Admitting: Family Medicine

## 2015-12-20 ENCOUNTER — Telehealth: Payer: Self-pay

## 2015-12-20 ENCOUNTER — Other Ambulatory Visit: Payer: Self-pay | Admitting: Family Medicine

## 2015-12-20 VITALS — BP 129/90 | HR 80

## 2015-12-20 DIAGNOSIS — F411 Generalized anxiety disorder: Secondary | ICD-10-CM

## 2015-12-20 NOTE — Progress Notes (Signed)
Confusion about what forms he needed today.  Plan to recheck with the proper forms in the future.  No charge for today's visit.

## 2015-12-20 NOTE — Telephone Encounter (Signed)
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 Requested a call back to verify letter legitimacy and stated that the pt may be terminated. Returned call and left a vm requesting a call back for further discussion and advised that the letter was written by Dr. Georgina Snell. Requested that additional information regarding pt leave be requested via appropriate forms and provided [phone] fax number.   Called pt and advised that his employer had reached out and that it may be beneficial to contact them and have them send up forms for a leave.

## 2015-12-25 ENCOUNTER — Encounter: Payer: Self-pay | Admitting: Family Medicine

## 2015-12-28 ENCOUNTER — Encounter: Payer: Self-pay | Admitting: Sports Medicine

## 2015-12-28 ENCOUNTER — Ambulatory Visit (INDEPENDENT_AMBULATORY_CARE_PROVIDER_SITE_OTHER): Payer: BLUE CROSS/BLUE SHIELD | Admitting: Sports Medicine

## 2015-12-28 DIAGNOSIS — J0100 Acute maxillary sinusitis, unspecified: Secondary | ICD-10-CM

## 2015-12-28 MED ORDER — FLUTICASONE PROPIONATE 50 MCG/ACT NA SUSP: NASAL | 3 refills | Status: DC

## 2015-12-28 MED ORDER — AMOXICILLIN-POT CLAVULANATE 875-125 MG PO TABS: 1.0000 | ORAL_TABLET | Freq: Two times a day (BID) | ORAL | 0 refills | Status: AC

## 2015-12-28 NOTE — Assessment & Plan Note (Signed)
Augmentin, Flonase, return if no better in one to 2 weeks

## 2015-12-28 NOTE — Progress Notes (Signed)
  Subjective:    CC: Feeling sick  HPI: For the past several days this pleasant 35 year old male has had malaise, facial pain and pressure, minimal nasal discharge. Symptoms are moderate, persistent.  Past medical history:  Negative.  See flowsheet/record as well for more information.  Surgical history: Negative.  See flowsheet/record as well for more information.  Family history: Negative.  See flowsheet/record as well for more information.  Social history: Negative.  See flowsheet/record as well for more information.  Allergies, and medications have been entered into the medical record, reviewed, and no changes needed.   Review of Systems: No fevers, chills, night sweats, weight loss, chest pain, or shortness of breath.   Objective:    General: Well Developed, well nourished, and in no acute distress.  Neuro: Alert and oriented x3, extra-ocular muscles intact, sensation grossly intact.  HEENT: Normocephalic, atraumatic, pupils equal round reactive to light, neck supple, no masses, no lymphadenopathy, thyroid nonpalpable. Oropharynx, nasopharynx, ear canals unremarkable, there is tenderness over the maxillary sinuses bilaterally Skin: Warm and dry, no rashes. Cardiac: Regular rate and rhythm, no murmurs rubs or gallops, no lower extremity edema.  Respiratory: Clear to auscultation bilaterally. Not using accessory muscles, speaking in full sentences.  Impression and Recommendations:    Acute non-recurrent maxillary sinusitis Augmentin, Flonase, return if no better in one to 2 weeks  I spent 25 minutes with this patient, greater than 50% was face-to-face time counseling regarding the above diagnoses

## 2016-01-11 ENCOUNTER — Other Ambulatory Visit: Payer: Self-pay | Admitting: Family Medicine

## 2016-01-11 MED ORDER — SYRINGE DISPOSABLE 3 ML MISC: 99 refills | Status: DC

## 2016-01-11 MED ORDER — SERTRALINE HCL 100 MG PO TABS: 100.0000 mg | ORAL_TABLET | Freq: Every day | ORAL | 2 refills | Status: DC

## 2016-01-11 MED ORDER — TESTOSTERONE CYPIONATE 200 MG/ML IM SOLN: 150.0000 mg | INTRAMUSCULAR | 1 refills | Status: DC

## 2016-01-15 ENCOUNTER — Encounter: Payer: Self-pay | Admitting: Family Medicine

## 2016-01-15 ENCOUNTER — Ambulatory Visit (INDEPENDENT_AMBULATORY_CARE_PROVIDER_SITE_OTHER): Payer: BLUE CROSS/BLUE SHIELD | Admitting: Family Medicine

## 2016-01-15 VITALS — BP 152/83 | HR 90

## 2016-01-15 DIAGNOSIS — F411 Generalized anxiety disorder: Secondary | ICD-10-CM

## 2016-01-15 DIAGNOSIS — F331 Major depressive disorder, recurrent, moderate: Secondary | ICD-10-CM

## 2016-01-15 MED ORDER — SERTRALINE HCL 100 MG PO TABS: 200.0000 mg | ORAL_TABLET | Freq: Every day | ORAL | 2 refills | Status: DC

## 2016-01-15 NOTE — Patient Instructions (Signed)
Thank you for coming in today. Let me know how its going.  Follow up with psychiatry on Thursday.

## 2016-01-15 NOTE — Progress Notes (Signed)
Tyler Hancock is a 35 y.o. male who presents to [facility] [place] today for follow-up anxiety and depression. Patient notes continued bothersome anxiety and depression symptoms. He takes the medications listed below. He has an appointment in 2 days with his psychiatrist for an initial consultation. He does not feel as though he is from. Her return to work. His symptoms are quite severe at times. He denies any SI or HI.   Past Medical History:  Diagnosis Date  . 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 GAD (generalized anxiety disorder) 12/19/2015  . Hypertension   . Hypotestosteronism 06/13/2014  . Major depression 06/13/2014  . Morbid obesity ([place]) 06/13/2014  . Thyroid disease    No past surgical history on file. Social History  Substance Use Topics  . Smoking status: Never Smoker  . Smokeless tobacco: Not on file  . Alcohol use 0.0 oz/week   family history includes Stroke in his other.  ROS as above:  Medications: Current Outpatient Prescriptions  Medication Sig Dispense Refill  . B-D 3CC LUER-LOK SYR 25GX1" 25G X 1" 3 ML MISC USE AS DIRECTED 10 each 0  . B-D HYPODERMIC NEEDLE 18GX1.5" 18G X 1-1/2" MISC USE AS DIRECTED 10 each 0  . buPROPion (WELLBUTRIN XL) 150 MG 24 hr tablet Take 1 tablet (150 mg total) by mouth every morning. 90 tablet 0  . buPROPion (WELLBUTRIN XL) 300 MG 24 hr tablet Take 1 tablet (300 mg total) by mouth daily. 90 tablet 2  . busPIRone (BUSPAR) 5 MG tablet Take 1 tablet (5 mg total) by mouth 3 (three) times daily. 90 tablet 0  . fluticasone (FLONASE) 50 MCG/ACT nasal spray One spray in each nostril twice a day, use left hand for right nostril, and right hand for left nostril. 48 g 3  . levothyroxine (SYNTHROID, LEVOTHROID) 175 MCG tablet TAKE 1 TABLET BY MOUTH DAILY 90 tablet 0  . losartan (COZAAR) 100 MG tablet TAKE 1 TABLET BY MOUTH DAILY 90  tablet 0  . sertraline (ZOLOFT) 100 MG tablet Take 2 tablets (200 mg total) by mouth daily. 60 tablet 2  . SYRINGE DISPOSABLE 3CC 3 ML MISC Use 1 syringe per week with testosterone injection. 25 each prn  . testosterone cypionate (DEPO-TESTOSTERONE) 200 MG/ML injection Inject 0.75 mLs (150 mg total) into the muscle every 7 (seven) days. 12 mL 1   No current facility-administered medications for this visit.    No Known Allergies  Health Maintenance Health Maintenance  Topic Date Due  . HIV Screening  07/19/1997  . INFLUENZA VACCINE  04/10/2016 (Originally 09/11/2015)  . TETANUS/TDAP  03/29/2018     Exam:  BP (!) 152/83   Pulse 90  Gen: Well NAD Psych: Alert and oriented normal speech thought process and affect. Depression screen [facility] 2/9 01/15/2016 12/19/2015 08/22/2015  Decreased Interest 2 2 2   Down, Depressed, Hopeless 2 2 3   PHQ - 2 Score 4 4 5   Altered sleeping 2 2 1   Tired, decreased energy 2 2 3   Change in appetite 3 2 2   Feeling bad or failure about yourself  - 1 2  Trouble concentrating 1 3 0  Moving slowly or fidgety/restless 2 3 1   Suicidal thoughts 0 0 0  PHQ-9 Score 14 17 14   Difficult doing work/chores - Extremely dIfficult Very difficult    GAD 7 : Generalized Anxiety Score 01/15/2016 12/19/2015 10/02/2015  Nervous, Anxious, on Edge 2 2 1   Control/stop worrying 2  2 1  Worry too much - different things 2 2 1   Trouble relaxing 1 1 1   Restless 1 2 0  Easily annoyed or irritable 1 2 0  Afraid - awful might happen 2 2 0  Total GAD 7 Score 11 13 4   Anxiety Difficulty Very difficult Extremely difficult Not difficult at all      No results found for this or any previous visit (from the past 82 hour(s)). No results found.    Assessment and Plan: 35 y.o. male with anxiety and depression. Still quite significant. Patient will follow-up with psychiatry in 2 days. Extend work absence for one month. I will be happy to fill out forms as needed. Return as needed.     No orders of the defined types were placed in this encounter.   Discussed warning signs or symptoms. Please see discharge instructions. Patient expresses understanding.

## 2016-01-17 ENCOUNTER — Ambulatory Visit (INDEPENDENT_AMBULATORY_CARE_PROVIDER_SITE_OTHER): Payer: BLUE CROSS/BLUE SHIELD | Admitting: Psychiatry

## 2016-01-17 ENCOUNTER — Encounter (HOSPITAL_COMMUNITY): Payer: Self-pay | Admitting: Psychiatry

## 2016-01-17 VITALS — BP 142/84 | HR 88 | Resp 16 | Ht 74.0 in | Wt 364.0 lb

## 2016-01-17 DIAGNOSIS — F410 Panic disorder [episodic paroxysmal anxiety] without agoraphobia: Secondary | ICD-10-CM

## 2016-01-17 DIAGNOSIS — F331 Major depressive disorder, recurrent, moderate: Secondary | ICD-10-CM | POA: Diagnosis not present

## 2016-01-17 DIAGNOSIS — F411 Generalized anxiety disorder: Secondary | ICD-10-CM

## 2016-01-17 DIAGNOSIS — Z79899 Other long term (current) drug therapy: Secondary | ICD-10-CM | POA: Diagnosis not present

## 2016-01-17 MED ORDER — BUSPIRONE HCL 10 MG PO TABS: 10.0000 mg | ORAL_TABLET | Freq: Every day | ORAL | 0 refills | Status: DC

## 2016-01-17 NOTE — Progress Notes (Signed)
Psychiatric Initial Adult Assessment   Patient Identification: Tyler Hancock MRN:  [id number] Date of Evaluation:  01/17/2016 Referral Source: Dr. Georgina Snell Chief Complaint:   Chief Complaint    Establish Care     Visit Diagnosis:    ICD-9-CM ICD-10-CM   1. Major depressive disorder, recurrent episode, moderate (HCC) 296.32 F33.1   2. GAD (generalized anxiety disorder) 300.02 F41.1 busPIRone (BUSPAR) 10 MG tablet  3. Panic disorder 300.01 F41.0     History of Present Illness:  35 years old currently mixed racial descent married male referred by Dr. 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 and was having difficulty for the past 3 months depression stress feeling overwhelmed difficult boss to deal with was feeling down and withdrawn and started having panic attack in November he could not breathe and was having chest pain he rated up with cardiology has also visited urgent care He's been taking Zoloft that is helping now he is out of work on short-term disability he feels his panic and anxiety attacks is more controlled He is looking for another job because he feels difficulty go back and feels the prevention of anxiety of going back to the same work environment and [place] he is not feeling hopeless he does feel sad because of finances and difficult relationship with his wife.  He endorses worries, excessive at times related to finances related to relationship. He has  relocated from another state. He gives a history of depression prior to having panic attacks for a couple months including disturbed sleep and energy crying spells and decreased focusing stressed or feeling of despair anhedonia and withdrawn feeling.  Buspar, zoloft and wellbutrin are helping . He takes irregularly buspar now. Now looking for another job as well  Severity of depression: 5/10 Aggravating factor: job apprehension.  Finances Modifying factor: goes for walk, family   Associated Signs/Symptoms: Depression Symptoms:  fatigue, difficulty concentrating, anxiety, panic attacks, loss of energy/fatigue, (Hypo) Manic Symptoms:  Distractibility, Anxiety Symptoms:  Excessive Worry, Psychotic Symptoms:  denies PTSD Symptoms: Had a traumatic exposure:  bullied when young as being big Avoidance:  alert and overcounsious  Past Psychiatric History: no prior admission  Previous Psychotropic Medications: Yes   Substance Abuse History in the last 12 months:  No.  Consequences of Substance Abuse: NA  Past Medical History:  Past Medical History:  Diagnosis Date  . 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 GAD (generalized anxiety disorder) 12/19/2015  . Hypertension   . Hypotestosteronism 06/13/2014  . Major depression 06/13/2014  . Morbid obesity ([place]) 06/13/2014  . Thyroid disease    History reviewed. No pertinent surgical history.  Family Psychiatric History: Maternal Uncle / Schizoprenia  Family History:  Family History  Problem Relation Age of Onset  . Stroke Other     Social History:   Social History   Social History  . Marital status: Married    Spouse name: N/A  . Number of children: N/A  . Years of education: N/A   Social History Main Topics  . Smoking status: Never Smoker  . Smokeless tobacco: Never Used  . Alcohol use 0.0 oz/week  . Drug use: No  . Sexual activity: Yes   Other Topics Concern  . None   Social History Narrative  . None    Additional Social History: Grew up with his parents his mom's white dad is from [place]. He is of mixed  racial had difficulty growing up in school because he was bullied because he was big and tall. He has been back and forth between mom and dad while growing up from [place] to [place] and [place]. Currently is married for last 8 years initially was going on well but there is some strain because of finances and that to relocate because of  jobless now he is not working Therefore he was old daughter No legal issue  Allergies:  No Known Allergies  Metabolic Disorder Labs: Lab Results  Component Value Date   HGBA1C 5.2 02/26/2015   MPG 103 02/26/2015   No results found for: PROLACTIN Lab Results  Component Value Date   CHOL 121 (L) 02/26/2015   TRIG 38 02/26/2015   HDL 44 02/26/2015   CHOLHDL 2.8 02/26/2015   VLDL 8 02/26/2015   LDLCALC 69 02/26/2015     Current Medications: Current Outpatient Prescriptions  Medication Sig Dispense Refill  . B-D 3CC LUER-LOK SYR 25GX1" 25G X 1" 3 ML MISC USE AS DIRECTED 10 each 0  . B-D HYPODERMIC NEEDLE 18GX1.5" 18G X 1-1/2" MISC USE AS DIRECTED 10 each 0  . buPROPion (WELLBUTRIN XL) 150 MG 24 hr tablet Take 1 tablet (150 mg total) by mouth every morning. 90 tablet 0  . buPROPion (WELLBUTRIN XL) 300 MG 24 hr tablet Take 1 tablet (300 mg total) by mouth daily. 90 tablet 2  . busPIRone (BUSPAR) 10 MG tablet Take 1 tablet (10 mg total) by mouth daily. 30 tablet 0  . fluticasone (FLONASE) 50 MCG/ACT nasal spray One spray in each nostril twice a day, use left hand for right nostril, and right hand for left nostril. 48 g 3  . levothyroxine (SYNTHROID, LEVOTHROID) 175 MCG tablet TAKE 1 TABLET BY MOUTH DAILY 90 tablet 0  . losartan (COZAAR) 100 MG tablet TAKE 1 TABLET BY MOUTH DAILY 90 tablet 0  . sertraline (ZOLOFT) 100 MG tablet Take 2 tablets (200 mg total) by mouth daily. 60 tablet 2  . SYRINGE DISPOSABLE 3CC 3 ML MISC Use 1 syringe per week with testosterone injection. 25 each prn  . testosterone cypionate (DEPO-TESTOSTERONE) 200 MG/ML injection Inject 0.75 mLs (150 mg total) into the muscle every 7 (seven) days. 12 mL 1   No current facility-administered medications for this visit.     Neurologic: Headache: No Seizure: No Paresthesias:No  Musculoskeletal: Strength & Muscle Tone: within normal limits Gait & Station: normal Patient leans: no lean  Psychiatric Specialty  Exam: Review of Systems  Cardiovascular: Negative for chest pain.  Gastrointestinal: Negative for nausea.  Skin: Negative for rash.  Psychiatric/Behavioral: Positive for depression. Negative for substance abuse and suicidal ideas.    Blood pressure (!) 142/84, pulse 88, resp. rate 16, height 6\' 2"  (1.88 m), weight (!) 364 lb (165.1 kg), SpO2 98 %.Body mass index is 46.73 kg/m.  General Appearance: Casual  Eye Contact:  Good  Speech:  Normal Rate  Volume:  Normal  Mood:  Dysphoric  Affect:  Congruent  Thought Process:  Goal Directed  Orientation:  Full (Time, Place, and Person)  Thought Content:  Rumination  Suicidal Thoughts:  No  Homicidal Thoughts:  No  Memory:  Immediate;   Fair Recent;   Fair  Judgement:  Fair  Insight:  Fair  Psychomotor Activity:  Normal  Concentration:  Concentration: Fair and Attention Span: Fair  Recall:  Good  Fund of Knowledge:Good  Language: Good  Akathisia:  Negative  Handed:  Right  AIMS (  if indicated):    Assets:  Desire for Improvement Social Support  ADL's:  Intact  Cognition: WNL  Sleep:  Fair     Treatment Plan Summary: Medication management and Plan as follows   1. Major depression; moderate. Continue zoloft and wellbutrin. Down the road we may cut down dose of wellbutrin considering can cause anxiety 2. GAD: continue zoloft 200mg  has refills. Lower or take buspar prn now 10mg  . Prescription sent 3. Panic disorder; continue zoloft 4. Graves DZ; now on replacement levothyroxine. Explained hypothyroid can cause depression and continue to monitor TSh Recommend therapy. Susie is currently on short-term disability and looking forward to move to another job and after that if he still have panic attacks and depression gets worse he will look into therapy Discussed side effects reviewed medications prided patient education and all questions were answered As of now current medication are helping and he is at home that is helping also he is  looking forward to another job so that he does not have the prevention of going back to the same workplace. Can cut down the BuSpar for now Follow-up in 4 weeks or earlier if needed   Merian Capron, MD 12/7/20179:35 AM

## 2016-01-22 ENCOUNTER — Ambulatory Visit (INDEPENDENT_AMBULATORY_CARE_PROVIDER_SITE_OTHER): Payer: BLUE CROSS/BLUE SHIELD

## 2016-01-22 ENCOUNTER — Ambulatory Visit (INDEPENDENT_AMBULATORY_CARE_PROVIDER_SITE_OTHER): Payer: BLUE CROSS/BLUE SHIELD | Admitting: Family Medicine

## 2016-01-22 ENCOUNTER — Encounter: Payer: Self-pay | Admitting: Family Medicine

## 2016-01-22 VITALS — BP 150/85

## 2016-01-22 DIAGNOSIS — M7671 Peroneal tendinitis, right leg: Secondary | ICD-10-CM

## 2016-01-22 DIAGNOSIS — M7661 Achilles tendinitis, right leg: Secondary | ICD-10-CM

## 2016-01-22 MED ORDER — DICLOFENAC SODIUM 1 % TD GEL: 2.0000 g | Freq: Four times a day (QID) | TRANSDERMAL | 11 refills | Status: DC

## 2016-01-22 NOTE — Progress Notes (Signed)
Tyler Hancock is a 35 y.o. male who presents to [facility] today for right lateral ankle pain. Patient notes persistent intermittent right lateral ankle pain. He was seen for this in early November and thought to have peroneal tendinitis. He treated with ankle brace at home exercise program which has helped a bit. He has most of the time he does not have much pain however he will have quite painful episodes of time. He notes the pain is worse with ambulation and turning while standing. Additionally he has pain while pressing and gas pattern with his right foot. Pain is felt laterally but does not radiate. No weakness or numbness.   Past Medical History:  Diagnosis Date  . 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 GAD (generalized anxiety disorder) 12/19/2015  . Hypertension   . Hypotestosteronism 06/13/2014  . Major depression 06/13/2014  . Morbid obesity ([place]) 06/13/2014  . Thyroid disease    No past surgical history on file. Social History  Substance Use Topics  . Smoking status: Never Smoker  . Smokeless tobacco: Never Used  . Alcohol use 0.0 oz/week     ROS:  As above   Medications: Current Outpatient Prescriptions  Medication Sig Dispense Refill  . B-D 3CC LUER-LOK SYR 25GX1" 25G X 1" 3 ML MISC USE AS DIRECTED 10 each 0  . B-D HYPODERMIC NEEDLE 18GX1.5" 18G X 1-1/2" MISC USE AS DIRECTED 10 each 0  . buPROPion (WELLBUTRIN XL) 150 MG 24 hr tablet Take 1 tablet (150 mg total) by mouth every morning. 90 tablet 0  . buPROPion (WELLBUTRIN XL) 300 MG 24 hr tablet Take 1 tablet (300 mg total) by mouth daily. 90 tablet 2  . busPIRone (BUSPAR) 10 MG tablet Take 1 tablet (10 mg total) by mouth daily. 30 tablet 0  . diclofenac sodium (VOLTAREN) 1 % GEL Apply 2 g topically 4 (four) times daily. To affected joint. 100 g 11  . fluticasone (FLONASE) 50 MCG/ACT nasal spray One spray in each nostril twice a day, use left hand for right nostril, and  right hand for left nostril. 48 g 3  . levothyroxine (SYNTHROID, LEVOTHROID) 175 MCG tablet TAKE 1 TABLET BY MOUTH DAILY 90 tablet 0  . losartan (COZAAR) 100 MG tablet TAKE 1 TABLET BY MOUTH DAILY 90 tablet 0  . sertraline (ZOLOFT) 100 MG tablet Take 2 tablets (200 mg total) by mouth daily. 60 tablet 2  . SYRINGE DISPOSABLE 3CC 3 ML MISC Use 1 syringe per week with testosterone injection. 25 each prn  . testosterone cypionate (DEPO-TESTOSTERONE) 200 MG/ML injection Inject 0.75 mLs (150 mg total) into the muscle every 7 (seven) days. 12 mL 1   No current facility-administered medications for this visit.    No Known Allergies   Exam:  BP (!) 150/85  General: Well Developed, well nourished, and in no acute distress.  Neuro/Psych: Alert and oriented x3, extra-ocular muscles intact, able to move all 4 extremities, sensation grossly intact. Skin: Warm and dry, no rashes noted.  Respiratory: Not using accessory muscles, speaking in full sentences, trachea midline.  Cardiovascular: Pulses palpable, no extremity edema. Abdomen: Does not appear distended. MSK: Right foot and ankle are largely unremarkable appearing. Nontender to palpation throughout. Pulses Refill sensation intact. Mildly positive talar tilt test. Negative anterior drawer test.      No results found for this or any previous visit (from the past 48 hour(s)). Dg Ankle Complete Right  Result Date: 01/22/2016 CLINICAL DATA:  Two months  of lateral ankle pain with out known injury. History of morbid obesity. EXAM: RIGHT ANKLE - COMPLETE 3+ VIEW COMPARISON:  None in PACs FINDINGS: The bones are subjectively adequately mineralized. The ankle joint mortise is preserved. The talar dome is intact. Tiny spurs arise from the tips of the medial and lateral and anterior malleoli. The talus and calcaneus are intact. There is an Achilles region calcaneal spur. The soft tissues are mildly prominent diffusely. IMPRESSION: Mild degenerative  spurring as described. No acute or old fracture or dislocation. No osteochondral changes of the talar dome or tibial plafond are observed. If the patient's symptoms warrant further evaluation, MRI would be a useful next imaging step. Electronically Signed   By: David  Martinique M.D.   On: 01/22/2016 15:12      Assessment and Plan: 35 y.o. male with lateral ankle pain likely peroneal tendinitis.  Plan for formal physical therapy and diclofenac gel. Return for recheck in a few weeks.    Orders Placed This Encounter  Procedures  . DG Ankle Complete Right    Standing Status:   Future    Number of Occurrences:   1    Standing Expiration Date:   03/24/2017    Order Specific Question:   Reason for Exam (SYMPTOM  OR DIAGNOSIS REQUIRED)    Answer:   Intermittent lateral ankle pain suspect peroneal tendinitis or tarsi sinus    Order Specific Question:   Preferred imaging location?    Answer:   [facility]  . Ambulatory referral to Physical Therapy    Referral Priority:   Routine    Referral Type:   Physical Medicine    Referral Reason:   Specialty Services Required    Requested Specialty:   Physical Therapy    Number of Visits Requested:   1    Discussed warning signs or symptoms. Please see discharge instructions. Patient expresses understanding.

## 2016-01-22 NOTE — Patient Instructions (Signed)
Thank you for coming in today. Attend PT.  Use the voltaren gel.

## 2016-01-31 ENCOUNTER — Encounter: Payer: Self-pay | Admitting: Physical Therapy

## 2016-01-31 ENCOUNTER — Ambulatory Visit (INDEPENDENT_AMBULATORY_CARE_PROVIDER_SITE_OTHER): Payer: BLUE CROSS/BLUE SHIELD | Admitting: Physical Therapy

## 2016-01-31 DIAGNOSIS — M79671 Pain in right foot: Secondary | ICD-10-CM

## 2016-01-31 DIAGNOSIS — R262 Difficulty in walking, not elsewhere classified: Secondary | ICD-10-CM | POA: Diagnosis not present

## 2016-01-31 DIAGNOSIS — M25571 Pain in right ankle and joints of right foot: Secondary | ICD-10-CM

## 2016-01-31 NOTE — Patient Instructions (Signed)
   3 times, holding 30 seconds each, 2-3 times a day   3 times, holding 30 seconds each, 2-3 times each day Repeat rotating foot inward Repeat rotating foot outward    Option for another calf stretch, Hold 30 seconds

## 2016-01-31 NOTE — Therapy (Addendum)
[facility] [address] [place], [place], [address] Phone: [phone]   Fax:  [phone]  Physical Therapy Evaluation  Patient Details  Name: Tyler Hancock MRN: [id number] Date of Birth: 22-Feb-1980 Referring Provider: Lynne Leader MD  Encounter Date: 01/31/2016      PT End of Session - 01/31/16 1152    Visit Number 1   Number of Visits 13   PT Start Time 1150   PT Stop Time 1230   PT Time Calculation (min) 40 min   Activity Tolerance Patient tolerated treatment well   Behavior During Therapy [facility] for tasks assessed/performed      Past Medical History:  Diagnosis Date  . 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 GAD (generalized anxiety disorder) 12/19/2015  . Hypertension   . Hypotestosteronism 06/13/2014  . Major depression 06/13/2014  . Morbid obesity ([place]) 06/13/2014  . Thyroid disease     History reviewed. No pertinent surgical history.  There were no vitals filed for this visit.       Subjective Assessment - 01/31/16 1202    Subjective Pt arriving to therapy complaining of R ankle pain on lateral aspect. pt reporting pain has progressively gotten worse of the last few months. pt reports it's worse after driving long distances, walking downhill and sudden changes of direction. Pt pain ranges from 0-7 at times depending on movements. Pt current pain is 0/10 at rest.    How long can you sit comfortably? 60 minutes   How long can you stand comfortably? 20 minutes   How long can you walk comfortably? 1 hour, but limited with downhill walking   Diagnostic tests X-ray was clear: 01/2016   Patient Stated Goals walk without pain, drive without pain   Currently in Pain? No/denies   Aggravating Factors  walking, driving,    Pain Relieving Factors elevating, over the counter pain meds   Effect of Pain on Daily Activities difficutly driving, walking long distances for exercise, down hill walking,             [facility]  PT Assessment - 01/31/16 0001      Assessment   Medical Diagnosis R foot pain   Referring Provider Lynne Leader MD   Hand Dominance Right   Prior Therapy yes, one visit for my back     Precautions   Precautions None     Restrictions   Weight Bearing Restrictions No     Balance Screen   Has the patient fallen in the past 6 months No   Has the patient had a decrease in activity level because of a fear of falling?  Yes   Is the patient reluctant to leave their home because of a fear of falling?  No     Home Ecologist residence   Living Arrangements Spouse/significant other   Available Help at Discharge Family   Type of [place]     Prior Function   Level of Independence Independent   Vocation Full time employment   Vocation Requirements  pt is an Chief Financial Officer, works at a desk most of the day   Leisure walking, exercising     Cognition   Overall Cognitive Status Within Functional Limits for tasks assessed     Observation/Other Assessments   Focus on Therapeutic Outcomes (FOTO)  38 % limitation     Functional Tests   Functional tests Squat     Squat   Comments Pt unable to perform full squat  keeping bilateral heels on the ground     Posture/Postural Control   Posture/Postural Control Postural limitations   Postural Limitations Rounded Shoulders;Forward head     ROM / Strength   AROM / PROM / Strength AROM;Strength     AROM   AROM Assessment Site Ankle   Right/Left Ankle Right   Right Ankle Dorsiflexion 5   Right Ankle Plantar Flexion 15   Right Ankle Inversion 40   Right Ankle Eversion 30     Strength   Overall Strength Within functional limits for tasks performed   Strength Assessment Site Ankle   Right/Left Ankle Right   Right Ankle Dorsiflexion 5/5   Right Ankle Plantar Flexion 5/5   Right Ankle Inversion 5/5   Right Ankle Eversion 5/5     Flexibility   Soft Tissue Assessment /Muscle Length --     Ambulation/Gait    Ambulation/Gait Yes   Ambulation/Gait Assistance 7: Independent   Ambulation Distance (Feet) 50 Feet   Assistive device None   Gait Pattern Step-through pattern;Poor foot clearance - right   Ambulation Surface Level;Indoor                           PT Education - 01/31/16 1213    Education provided Yes   Education Details HEP   Person(s) Educated Patient   Methods Explanation;Demonstration;Handout   Comprehension Verbalized understanding;Returned demonstration          PT Short Term Goals - 01/31/16 1427      PT SHORT TERM GOAL #1   Title Pt will be independent with his HEP   Time 3   Period Weeks   Status New           PT Long Term Goals - 01/31/16 1427      PT LONG TERM GOAL #1   Title Pt will increase ROM in his Right DF from 5 degrees to 15 degrees in order to improve gait.    Time 6   Period Weeks   Status New     PT LONG TERM GOAL #2   Title Pt will improve FOTO score from 38% limitation  to 25% limitation in order to imprve functional mobility.    Time 6   Period Weeks   Status New     PT LONG TERM GOAL #3   Title Pt will be independent with his HEP progression and advancement.    Time 6   Period Weeks   Status New     PT LONG TERM GOAL #4   Title Pt will be able to amb downhill reporting pain of less than 2/10.    Time 6   Period Weeks   Status New               Plan - 01/31/16 1419    Clinical Impression Statement pt presenting today to physical therapy reporting pain in his R ankle which began a couple of months ago. pt reporting his pain in worsening and the frequency is increasing. Pt with tenderness noted inferior to lateral malleolus. Pt also with increased pain  when performing ankle inversion with pain extending proximally up the peroneal muscle. Pt reporting pain increases when walking downhill and with driving where his foot and ankle is internally rotated. Skilled therapy needed to improve pt's ROM, decrease  pain and increase overall functioanl mobility with the below interventions.    Rehab Potential Good   PT Frequency 2x /  week   PT Duration 6 weeks   PT Treatment/Interventions ADLs/Self Care Home Management;Therapeutic activities;Therapeutic exercise;Cryotherapy;Electrical Stimulation;Iontophoresis 11m/ml Dexamethasone;Functional mobility training;Stair training;Gait training;Manual techniques;Patient/family education;Passive range of motion;Balance training;Taping   PT Next Visit Plan review HEP, ankle stretches/ROM, E-stim if applicable   PT Home Exercise Plan Heel Cord stretches, Hamstring Stretches, Calf/Soleus stretching   Consulted and Agree with Plan of Care Patient      Patient will benefit from skilled therapeutic intervention in order to improve the following deficits and impairments:  Difficulty walking, Pain, Impaired perceived functional ability, Decreased activity tolerance, Decreased mobility, Decreased strength, Decreased range of motion  Visit Diagnosis: Pain in right foot  Pain in right ankle and joints of right foot  Difficulty in walking, not elsewhere classified     Problem List Patient Active Problem List   Diagnosis Date Noted  . GAD (generalized anxiety disorder) 12/19/2015  . Peroneal tendonitis of right lower extremity 12/19/2015  . Teeth grinding 10/02/2015  . Family history of colon cancer 08/22/2015  . Abnormal weight gain 05/01/2015  . Migraine variant 02/22/2015  . Major depression 06/13/2014  . Essential (primary) hypertension 06/13/2014  . Personal history of other endocrine, nutritional and metabolic disease 010/96/0454 . 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 [place] 06/13/2014  . Hypotestosteronism 06/13/2014    JOretha Caprice MPT 01/31/2016, 4:08 PM  [facility] [address] [address]Phone: [phone]  Fax:  [phone] Name: Tyler NieblasMRN: [id number] of Birth: [birth year] PHYSICAL THERAPY DISCHARGE SUMMARY  Visits from Start of Care: 1  Current functional level related to goals / functional outcomes: unknown   Remaining deficits: Unknown, patient has not returned since eval   Education / Equipment: Initial HEP Plan:                                                    Patient goals were not met. Patient is being discharged due to not returning since the last visit.  ?????     SJeral Pinch PT 03/06/16 5:18 PM

## 2016-02-12 ENCOUNTER — Encounter: Payer: Self-pay | Admitting: Physical Therapy

## 2016-02-13 ENCOUNTER — Encounter: Payer: Self-pay | Admitting: Family Medicine

## 2016-02-20 ENCOUNTER — Other Ambulatory Visit: Payer: Self-pay | Admitting: Family Medicine

## 2016-02-20 ENCOUNTER — Ambulatory Visit (HOSPITAL_COMMUNITY): Payer: Self-pay | Admitting: Psychiatry

## 2016-02-21 ENCOUNTER — Encounter: Payer: Self-pay | Admitting: Physical Therapy

## 2016-02-26 ENCOUNTER — Ambulatory Visit (INDEPENDENT_AMBULATORY_CARE_PROVIDER_SITE_OTHER): Payer: Self-pay | Admitting: Family Medicine

## 2016-02-26 ENCOUNTER — Encounter: Payer: Self-pay | Admitting: Family Medicine

## 2016-02-26 DIAGNOSIS — M25519 Pain in unspecified shoulder: Secondary | ICD-10-CM | POA: Insufficient documentation

## 2016-02-26 DIAGNOSIS — M25511 Pain in right shoulder: Secondary | ICD-10-CM

## 2016-02-26 MED ORDER — NAPROXEN 500 MG PO TABS: 500.0000 mg | ORAL_TABLET | Freq: Two times a day (BID) | ORAL | 0 refills | Status: DC

## 2016-02-26 NOTE — Progress Notes (Signed)
Tyler Hancock is a 36 y.o. male who presents to [facility] today for right shoulder pain. Patient has a one-week history of right shoulder pain. Pain occurs in the lateral upper arm worse at bedtime and with some arm motion. He denies any pain with overhead motion reaching back however. He denies any pain radiating below the level of the elbow. He denies any injury. He has not tried much treatment yet but the pain is interfering with sleep. No fevers or chills.   Past Medical History:  Diagnosis Date  . 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 GAD (generalized anxiety disorder) 12/19/2015  . Hypertension   . Hypotestosteronism 06/13/2014  . Major depression 06/13/2014  . Morbid obesity ([place]) 06/13/2014  . Thyroid disease    No past surgical history on file. Social History  Substance Use Topics  . Smoking status: Never Smoker  . Smokeless tobacco: Never Used  . Alcohol use 0.0 oz/week     ROS:  As above   Medications: Current Outpatient Prescriptions  Medication Sig Dispense Refill  . B-D 3CC LUER-LOK SYR 25GX1" 25G X 1" 3 ML MISC USE AS DIRECTED 10 each 0  . B-D HYPODERMIC NEEDLE 18GX1.5" 18G X 1-1/2" MISC USE AS DIRECTED 10 each 0  . buPROPion (WELLBUTRIN XL) 150 MG 24 hr tablet TAKE 1 TABLET BY MOUTH DAILY 90 tablet 0  . buPROPion (WELLBUTRIN XL) 300 MG 24 hr tablet TAKE 1 TABLET BY MOUTH DAILY 90 tablet 0  . busPIRone (BUSPAR) 10 MG tablet Take 1 tablet (10 mg total) by mouth daily. 30 tablet 0  . diclofenac sodium (VOLTAREN) 1 % GEL Apply 2 g topically 4 (four) times daily. To affected joint. 100 g 11  . fluticasone (FLONASE) 50 MCG/ACT nasal spray One spray in each nostril twice a day, use left hand for right nostril, and right hand for left nostril. 48 g 3  . levothyroxine (SYNTHROID, LEVOTHROID) 175 MCG tablet TAKE 1 TABLET BY MOUTH DAILY 90 tablet 0  . losartan (COZAAR) 100 MG tablet TAKE 1 TABLET BY MOUTH DAILY 90 tablet 0  .  naproxen (NAPROSYN) 500 MG tablet Take 1 tablet (500 mg total) by mouth 2 (two) times daily with a meal. 60 tablet 0  . sertraline (ZOLOFT) 100 MG tablet Take 2 tablets (200 mg total) by mouth daily. 60 tablet 2  . SYRINGE DISPOSABLE 3CC 3 ML MISC Use 1 syringe per week with testosterone injection. 25 each prn  . testosterone cypionate (DEPO-TESTOSTERONE) 200 MG/ML injection Inject 0.75 mLs (150 mg total) into the muscle every 7 (seven) days. 12 mL 1   No current facility-administered medications for this visit.    No Known Allergies   Exam:  BP 127/80   Pulse 81  General: Well Developed, well nourished, and in no acute distress.  Neuro/Psych: Alert and oriented x3, extra-ocular muscles intact, able to move all 4 extremities, sensation grossly intact. Skin: Warm and dry, no rashes noted.  Respiratory: Not using accessory muscles, speaking in full sentences, trachea midline.  Cardiovascular: Pulses palpable, no extremity edema. Abdomen: Does not appear distended. MSK:  C-spine nontender normal neck motion.  Right shoulder: Normal-appearing nontender normal shoulder motion. Normal strength to abduction external and internal rotation. Patient is a palpable click and pain with Hawkins test but a negative Neer's test and a negative Yergason's and speeds test. His O'Brien test is negative. His crossover arm compression test is mildly positive.  Pulses capillary refill and sensation are  intact distally.    No results found for this or any previous visit (from the past 48 hour(s)). No results found.    Assessment and Plan: 36 y.o. male with  Right shoulder pain. This is somewhat confusing as his exam is not really consistent with subacromial bursitis or rotator cuff tendinitis. It is not consistent with biceps tendinitis or acromioclavicular DJD. The palpable click is concerning for a labrum tear. We discussed options. Plan for trial of home physical therapy and naproxen. If not better  return to clinic will proceed with a diagnostic and therapeutic injection.    No orders of the defined types were placed in this encounter.   Discussed warning signs or symptoms. Please see discharge instructions. Patient expresses understanding.

## 2016-02-26 NOTE — Patient Instructions (Signed)
Thank you for coming in today. Use the exercises 3x daily.  Take naproxen twice daily as needed.  Recheck in 2-4 weeks if not better.    SLAP Lesions Rehab Ask your health care provider which exercises are safe for you. Do exercises exactly as told by your health care provider and adjust them as directed. It is normal to feel mild stretching, pulling, tightness, or discomfort as you do these exercises, but you should stop right away if you feel sudden pain or your pain gets worse.Do not begin these exercises until told by your health care provider. Stretching and range of motion exercise This exercise warms up your muscles and joints and improves the movement and flexibility of your shoulder. This exercise also helps to relieve pain and stiffness. Exercise A: Passive shoulder horizontal adduction 1. Sit or stand and pull your left / right elbow across your chest, toward your other shoulder. Stop when you feel a gentle stretch in the back of your shoulder and upper arm.  Keep your arm at shoulder height.  Keep your arm as close to your body as you comfortably can. 2. Hold for __________ seconds. 3. Slowly return to the starting position. Repeat __________ times. Complete this exercise __________ times a day. Strengthening exercises These exercises build strength and endurance in your shoulder. Endurance is the ability to use your muscles for a long time, even after they get tired. Exercise B: Scapular protraction, supine 1. Lie on your back on a firm surface. If directed, hold a __________ weight in your left / right hand. 2. Raise your left / right arm straight into the air so your hand is directly above your shoulder joint. 3. Lift your shoulder off of the floor so you push the weight into the air. Do not move your head, neck, or back. 4. Hold for __________ seconds. 5. Slowly return to the starting position. Let your muscles relax completely before you repeat this exercise. Repeat  __________ times. Complete this exercise __________ times a day. Exercise C: Scapular retraction 1. Sit in a stable chair without armrests, or stand. 2. Secure an exercise band to a stable object in front of you so the band is at shoulder height. 3. Hold one end of the exercise band in each hand. 4. Squeeze your shoulder blades together and move your elbows slightly behind you. Do not shrug your shoulders. 5. Hold for __________ seconds. 6. Slowly return to the starting position. Repeat __________ times. Complete this exercise __________ times a day. Exercise D: Shoulder external rotation 1. Lie down on your left / right side. 2. Bend your left / right elbow to an "L" shape (90 degrees). Place a small pillow or a rolled-up towel under your left / right upper arm. 3. With your elbow bent to 90 degrees, place your left / right hand on your abdomen. 4. Squeeze your shoulder blade back toward your spine. 5. Keeping your upper arm against the pillow or towel, move (pivot) your forearm and hand away from your abdomen and toward the ceiling. Keep your elbow bent to 90 degrees. 6. Hold for __________ seconds. 7. Slowly return to the starting position. Repeat __________ times. Complete this exercise __________ times a day. Exercise E: Shoulder extension, prone 1. Lie on your abdomen on a firm surface so your left / right arm hangs over the edge. 2. Hold a __________ weight in your hand so your palm faces in toward your body. Your arm should be straight. 3. Squeeze your  shoulder blade down toward the middle of your back. 4. Slowly raise your arm behind you, up to the height of the surface that you are lying on. Keep your arm straight. 5. Hold for __________ seconds. 6. Slowly return to the starting position and relax your muscles. Repeat __________ times. Complete this exercise __________ times a day. This information is not intended to replace advice given to you by your health care provider. Make  sure you discuss any questions you have with your health care provider. Document Released: 01/27/2005 Document Revised: 10/04/2015 Document Reviewed: 12/30/2014 Elsevier Interactive Patient Education  2017 [employer].

## 2016-03-02 ENCOUNTER — Other Ambulatory Visit: Payer: Self-pay | Admitting: Family Medicine

## 2016-03-03 ENCOUNTER — Ambulatory Visit (INDEPENDENT_AMBULATORY_CARE_PROVIDER_SITE_OTHER): Payer: Managed Care, Other (non HMO) | Admitting: Family Medicine

## 2016-03-03 ENCOUNTER — Encounter: Payer: Self-pay | Admitting: Family Medicine

## 2016-03-03 VITALS — BP 138/44 | HR 78

## 2016-03-03 DIAGNOSIS — M25511 Pain in right shoulder: Secondary | ICD-10-CM | POA: Diagnosis not present

## 2016-03-03 NOTE — Patient Instructions (Signed)
Thank you for coming in today. Call or go to the ER if you develop a large red swollen joint with extreme pain or oozing puss.  Continue the exercises.  Return as needed.

## 2016-03-03 NOTE — Progress Notes (Signed)
Tyler Hancock is a 36 y.o. male who presents to [facility] today for right shoulder pain. Patient notes continued right shoulder pain. He has tried home therapy exercises and not improved. He notes continued pain especially when driving his car. He denies any significant radiating pain weakness or numbness. He's interested in injection if possible.   Past Medical History:  Diagnosis Date  . 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 GAD (generalized anxiety disorder) 12/19/2015  . Hypertension   . Hypotestosteronism 06/13/2014  . Major depression 06/13/2014  . Morbid obesity ([place]) 06/13/2014  . Thyroid disease    No past surgical history on file. Social History  Substance Use Topics  . Smoking status: Never Smoker  . Smokeless tobacco: Never Used  . Alcohol use 0.0 oz/week     ROS:  As above   Medications: Current Outpatient Prescriptions  Medication Sig Dispense Refill  . B-D 3CC LUER-LOK SYR 25GX1" 25G X 1" 3 ML MISC USE AS DIRECTED 10 each 0  . B-D HYPODERMIC NEEDLE 18GX1.5" 18G X 1-1/2" MISC USE AS DIRECTED 10 each 0  . buPROPion (WELLBUTRIN XL) 150 MG 24 hr tablet TAKE 1 TABLET BY MOUTH DAILY 90 tablet 0  . buPROPion (WELLBUTRIN XL) 300 MG 24 hr tablet TAKE 1 TABLET BY MOUTH DAILY 90 tablet 0  . busPIRone (BUSPAR) 10 MG tablet Take 1 tablet (10 mg total) by mouth daily. 30 tablet 0  . diclofenac sodium (VOLTAREN) 1 % GEL Apply 2 g topically 4 (four) times daily. To affected joint. 100 g 11  . fluticasone (FLONASE) 50 MCG/ACT nasal spray One spray in each nostril twice a day, use left hand for right nostril, and right hand for left nostril. 48 g 3  . levothyroxine (SYNTHROID, LEVOTHROID) 175 MCG tablet TAKE 1 TABLET BY MOUTH DAILY 90 tablet 0  . losartan (COZAAR) 100 MG tablet TAKE 1 TABLET BY MOUTH DAILY 90 tablet 0  . naproxen (NAPROSYN) 500 MG tablet Take 1 tablet (500 mg total) by mouth 2 (two) times daily with a meal. 60 tablet 0   . sertraline (ZOLOFT) 100 MG tablet Take 2 tablets (200 mg total) by mouth daily. 60 tablet 2  . SYRINGE DISPOSABLE 3CC 3 ML MISC Use 1 syringe per week with testosterone injection. 25 each prn  . testosterone cypionate (DEPO-TESTOSTERONE) 200 MG/ML injection Inject 0.75 mLs (150 mg total) into the muscle every 7 (seven) days. 12 mL 1   No current facility-administered medications for this visit.    No Known Allergies   Exam:  BP (!) 138/44   Pulse 78  General: Well Developed, well nourished, and in no acute distress.  Neuro/Psych: Alert and oriented x3, extra-ocular muscles intact, able to move all 4 extremities, sensation grossly intact. Skin: Warm and dry, no rashes noted.  Respiratory: Not using accessory muscles, speaking in full sentences, trachea midline.  Cardiovascular: Pulses palpable, no extremity edema. Abdomen: Does not appear distended. MSK: Right shoulder normal-appearing nontender. Normal motion. Palpable pain and clicking with Hawkins test present.  Procedure: Real-time Ultrasound Guided Injection of right glenohumeral joint  Device: GE Logiq E  Images permanently stored and available for review in the ultrasound unit. Verbal informed consent obtained. Discussed risks and benefits of procedure. Warned about infection bleeding damage to structures skin hypopigmentation and fat atrophy among others. Patient expresses understanding and agreement Time-out conducted.  Noted no overlying erythema, induration, or other signs of local infection.  Skin prepped in a sterile fashion.  Local anesthesia: Topical Ethyl chloride.  With sterile technique and under real time ultrasound guidance: 3 mL of Marcaine and 40 mg of Kenalog injected easily.  Completed without difficulty  Pain immediately resolved suggesting accurate placement of the medication.  Advised to call if fevers/chills, erythema, induration, drainage, or persistent bleeding.  Images permanently stored  and available for review in the ultrasound unit.  Impression: Technically successful ultrasound guided injection.      No results found for this or any previous visit (from the past 48 hour(s)). No results found.    Assessment and Plan: 36 y.o. male with right shoulder pain very likely labrum in nature. Patient had improvement with Marcaine injection. Plan for continued home therapy. Recheck in about a month or sooner if needed.    No orders of the defined types were placed in this encounter.   Discussed warning signs or symptoms. Please see discharge instructions. Patient expresses understanding.

## 2016-03-17 ENCOUNTER — Other Ambulatory Visit: Payer: Self-pay | Admitting: Family Medicine

## 2016-03-17 ENCOUNTER — Other Ambulatory Visit (HOSPITAL_COMMUNITY): Payer: Self-pay | Admitting: Psychiatry

## 2016-03-17 DIAGNOSIS — F411 Generalized anxiety disorder: Secondary | ICD-10-CM

## 2016-03-23 NOTE — Telephone Encounter (Signed)
Medication refill- received fax from [facility] requesting a refill for Buspar. Per Dr. De Nurse, refill request is denied. Buspar Rx was sent to pharmacy on 01/17/16. Pt will need to schedule an apt. Lvm for pt to contact office.

## 2016-03-25 ENCOUNTER — Encounter: Payer: Self-pay | Admitting: Family Medicine

## 2016-03-25 DIAGNOSIS — F411 Generalized anxiety disorder: Secondary | ICD-10-CM

## 2016-03-26 MED ORDER — BUSPIRONE HCL 10 MG PO TABS: 10.0000 mg | ORAL_TABLET | Freq: Every day | ORAL | 3 refills | Status: DC

## 2016-03-27 ENCOUNTER — Telehealth: Payer: Self-pay | Admitting: *Deleted

## 2016-03-27 NOTE — Telephone Encounter (Signed)
Trying to initiate a PA on testosterone cypionate. I feel this will be denied since there are two lab results in his chart and one is normal and the other is low normal.  I can go ahead and submit but very certain they will deny this. Do you want him to do a wash out period where is comes off of the medication in order to submit two low early am results? What do you recommend?

## 2016-03-28 NOTE — Telephone Encounter (Signed)
Lets put it to the patient and see what he wants to do. He may be able to provider records from his old doctor I do not have.

## 2016-03-31 NOTE — Telephone Encounter (Signed)
[id number] -

## 2016-03-31 NOTE — Telephone Encounter (Signed)
Patient would rather not do a wash out period. If cannot obtain labs from previous provider then may not have a choice

## 2016-04-08 ENCOUNTER — Encounter: Payer: Self-pay | Admitting: Family Medicine

## 2016-04-18 NOTE — Telephone Encounter (Signed)
Have not received any notes from previous office though sent a request. (See mychart message from patient) closing encounter.

## 2016-04-21 ENCOUNTER — Encounter: Payer: Self-pay | Admitting: Family Medicine

## 2016-04-21 ENCOUNTER — Ambulatory Visit (INDEPENDENT_AMBULATORY_CARE_PROVIDER_SITE_OTHER): Payer: Managed Care, Other (non HMO)

## 2016-04-21 ENCOUNTER — Ambulatory Visit (INDEPENDENT_AMBULATORY_CARE_PROVIDER_SITE_OTHER): Payer: Managed Care, Other (non HMO) | Admitting: Family Medicine

## 2016-04-21 VITALS — BP 144/83 | HR 85 | Wt 376.0 lb

## 2016-04-21 DIAGNOSIS — F411 Generalized anxiety disorder: Secondary | ICD-10-CM | POA: Diagnosis not present

## 2016-04-21 DIAGNOSIS — E349 Endocrine disorder, unspecified: Secondary | ICD-10-CM | POA: Diagnosis not present

## 2016-04-21 DIAGNOSIS — M25511 Pain in right shoulder: Secondary | ICD-10-CM

## 2016-04-21 MED ORDER — TESTOSTERONE CYPIONATE 200 MG/ML IM SOLN: 150.0000 mg | INTRAMUSCULAR | 1 refills | Status: DC

## 2016-04-21 MED ORDER — SERTRALINE HCL 100 MG PO TABS: 200.0000 mg | ORAL_TABLET | Freq: Every day | ORAL | 1 refills | Status: DC

## 2016-04-21 MED ORDER — LOSARTAN POTASSIUM 100 MG PO TABS: 100.0000 mg | ORAL_TABLET | Freq: Every day | ORAL | 1 refills | Status: DC

## 2016-04-21 MED ORDER — BUPROPION HCL ER (XL) 300 MG PO TB24: 300.0000 mg | ORAL_TABLET | Freq: Every day | ORAL | 1 refills | Status: DC

## 2016-04-21 MED ORDER — BUSPIRONE HCL 10 MG PO TABS: 10.0000 mg | ORAL_TABLET | Freq: Every day | ORAL | 1 refills | Status: DC

## 2016-04-21 MED ORDER — LEVOTHYROXINE SODIUM 175 MCG PO TABS
175.0000 ug | ORAL_TABLET | Freq: Every day | ORAL | 1 refills | Status: DC
Start: 2016-04-21 — End: 2016-05-13

## 2016-04-21 MED ORDER — BUPROPION HCL ER (XL) 150 MG PO TB24: 150.0000 mg | ORAL_TABLET | Freq: Every day | ORAL | 1 refills | Status: DC

## 2016-04-21 NOTE — Patient Instructions (Signed)
Thank you for coming in today. Make an appointment with me 1 hour prior to the MRI time likely Monday.  Let me know if you do not hear anything by Thursday.    Magnetic Resonance Imaging Magnetic resonance imaging (MRI) is an imaging test that produces clear digital pictures of the inside of your body without using X-rays. The MRI scanner uses radio waves and a magnetic field to create the images. The MRI pictures may provide different details than images obtained through X-rays, CT scans, or ultrasounds. Contrast material may be injected to make MRI images even more clear. In a standard MRI scanner, the area of your body being studied will be in the center opening of the scanner. In open MRI scanners, the scanner does not entirely surround your body. Tell a health care provider about:  Any surgeries you have had.  Any metal you may have in your body. The magnet used in MRI can cause metal objects in your body to move. This includes:  A pacemaker or any other implants, such as an implanted neurostimulator, a metallic ear implant, or a metallic object within the eye socket.  Metal splinters in your body.  Any bullet fragments.  A port for delivering insulin or chemotherapy.  Any tattoos. Some red dyes contain iron which is sometimes a problem.  If you are pregnant or may be pregnant.  If you are breastfeeding.  If you are afraid of cramped spaces (claustrophobic). If claustrophobia is a problem, it usually can be relieved with medicines or the use of the open MRI scanner.  Any allergies you have.  All medicines you are taking, including vitamins, herbs, eye drops, creams, and over-the-counter medicines. What are the risks? Generally, MRI is a safe procedure. However, problems can occur and include:  If a metal implant is present but is undetected, it may be affected by the strong magnetic field. In addition, if the implant is close to the examination site, it may be hard to get  high-quality images.  If you are pregnant:  MRI generally should be avoided during the first three months of pregnancy. It is not known what effects the MRI may have on a fetus. Ultrasound is preferred at this time unless a serious condition is suspected that is best studied by MRI. MRI should be considered if there is a substantial risk of missing the correct diagnosis if MRI is not done.  If you are breastfeeding:  You should inform your health care provider and ask how to proceed. You may pump breast milk before the exam for use until the contrast material, if used, has cleared from the body. What happens before the procedure?  You will be asked to remove all metal, including:  Your watch, jewelry, and other metal objects.  Some makeup also contains traces of metal and may need to be removed.  Braces and fillings normally are not a problem. What happens during the procedure?  You may be given earplugs or headphones to listen to music. The MRI scanner can be noisy.  You may be injected with contrast material.  The standard MRI is done in a long, magnetic chamber. You will lie down on a platform that slides into the magnetic chamber. Once inside, you will still be able to talk to the person performing the test. The open MRI scanner is open on at least one side of the scanner.  You will be asked to hold very still. You will be told when you can shift  position. You may have to wait a few minutes to make sure the images are readable. What happens after the procedure?  You may resume normal activities right away.  If you were given contrast material, it will pass naturally through your body within a day.  A person experienced in MRI (radiologist) will analyze the results and send a report to your health care provider, along with an explanation of the results. This information is not intended to replace advice given to you by your health care provider. Make sure you discuss any  questions you have with your health care provider. Document Released: 01/25/2000 Document Revised: 07/02/2015 Document Reviewed: 03/24/2013 Elsevier Interactive Patient Education  2017 [employer].

## 2016-04-21 NOTE — Progress Notes (Signed)
Tyler Hancock is a 36 y.o. male who presents to [facility] today for right shoulder pain and low testosterone.   Right Shoulder Pain: Patient continues to have right shoulder pain. He was first seen on 02/26/16 for this. He had a trial of home therapy which did not work well. He received a right [place] injection on 03/03/16. He notes that the pain was immediately controlled after the injection. The pain has returned recently. He notes occasional popping and clicking which he finds obnoxious. He denies any radiating pain weakness or numbness. Pain is worse when he is trying to sleep at night.  Low testosterone: Patient switched health insurance relatively recently. He has been having difficulty getting testosterone improved. He's been on testosterone for years and the medical records from his previous doctors office are not available. The final problem is that we do not have definitive proof that he had pre-testosterone levels checked prior to initiating therapy. As result of this health insurance in the past he has been out of testosterone now for 2 weeks and is feeling poorly. He notes fatigue and decreased libido.   Past Medical History:  Diagnosis Date  . 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 GAD (generalized anxiety disorder) 12/19/2015  . Hypertension   . Hypotestosteronism 06/13/2014  . Major depression 06/13/2014  . Morbid obesity ([place]) 06/13/2014  . Thyroid disease    No past surgical history on file. Social History  Substance Use Topics  . Smoking status: Never Smoker  . Smokeless tobacco: Never Used  . Alcohol use 0.0 oz/week     ROS:  As above   Medications: Current Outpatient Prescriptions  Medication Sig Dispense Refill  . B-D 3CC LUER-LOK SYR 25GX1" 25G X 1" 3 ML MISC USE AS DIRECTED 10 each 0  . B-D HYPODERMIC NEEDLE 18GX1.5" 18G X 1-1/2" MISC USE AS DIRECTED 10 each 0  . buPROPion (WELLBUTRIN XL) 150 MG 24 hr tablet Take 1  tablet (150 mg total) by mouth daily. 90 tablet 1  . buPROPion (WELLBUTRIN XL) 300 MG 24 hr tablet Take 1 tablet (300 mg total) by mouth daily. 90 tablet 1  . busPIRone (BUSPAR) 10 MG tablet Take 1 tablet (10 mg total) by mouth daily. 90 tablet 1  . diclofenac sodium (VOLTAREN) 1 % GEL Apply 2 g topically 4 (four) times daily. To affected joint. 100 g 11  . fluticasone (FLONASE) 50 MCG/ACT nasal spray One spray in each nostril twice a day, use left hand for right nostril, and right hand for left nostril. 48 g 3  . levothyroxine (SYNTHROID, LEVOTHROID) 175 MCG tablet Take 1 tablet (175 mcg total) by mouth daily. 90 tablet 1  . losartan (COZAAR) 100 MG tablet Take 1 tablet (100 mg total) by mouth daily. 90 tablet 1  . naproxen (NAPROSYN) 500 MG tablet Take 1 tablet (500 mg total) by mouth 2 (two) times daily with a meal. 60 tablet 0  . sertraline (ZOLOFT) 100 MG tablet Take 2 tablets (200 mg total) by mouth daily. 180 tablet 1  . SYRINGE DISPOSABLE 3CC 3 ML MISC Use 1 syringe per week with testosterone injection. 25 each prn  . testosterone cypionate (DEPO-TESTOSTERONE) 200 MG/ML injection Inject 0.75 mLs (150 mg total) into the muscle every 7 (seven) days. 12 mL 1   No current facility-administered medications for this visit.    No Known Allergies   Exam:  BP (!) 144/83   Pulse 85   Wt (!) 376 lb (  170.6 kg)   BMI 48.28 kg/m  General: Well Developed, well nourished, and in no acute distress.  Neuro/Psych: Alert and oriented x3, extra-ocular muscles intact, able to move all 4 extremities, sensation grossly intact. Skin: Warm and dry, no rashes noted.  Respiratory: Not using accessory muscles, speaking in full sentences, trachea midline.  Cardiovascular: Pulses palpable, no extremity edema. Abdomen: Does not appear distended. MSK: right shoulder normal-appearing nontender. Normal motion. Clicking palpable with motion of shoulder. No locking or catching. Negative Hawkins and Neer's test  normal strength.  Lab Results  Component Value Date   TESTOSTERONE 787 08/27/2015     No results found for this or any previous visit (from the past 48 hour(s)). Dg Shoulder Right  Result Date: 04/21/2016 CLINICAL DATA:  Right shoulder pain for 3-4 months EXAM: RIGHT SHOULDER - 2+ VIEW COMPARISON:  None. FINDINGS: There is irregularity of the mid body of the scapula. The glenohumeral and acromioclavicular joints are normal. IMPRESSION: Irregularity of the midportion of the scapular body may indicate an age indeterminate fracture. Correlation for tenderness at this location and/or CT of the right shoulder is recommended. Electronically Signed   By: Ulyses Jarred M.D.   On: 04/21/2016 14:51      Assessment and Plan: 36 y.o. male with Right Shoulder pain: Unclear etiology suspect labrum injury. Plan for MRI arthrogram in the near future. Patient does not have any scapula pain. Doubtful finding on x-ray has anything to do with with cause of current pain.  Low testosterone: We are continuing the appeal to get more testosterone. I have prescribed testosterone the patient can pay for on his own as this may be an option.  Chronic medications sent to different pharmacy.   Orders Placed This Encounter  Procedures  . DG Shoulder Right    Standing Status:   Future    Number of Occurrences:   1    Standing Expiration Date:   06/21/2017    Order Specific Question:   Reason for Exam (SYMPTOM  OR DIAGNOSIS REQUIRED)    Answer:   eval 2 months shoudler pain    Order Specific Question:   Preferred imaging location?    Answer:   [facility]  . MR SHOULDER RIGHT W CONTRAST    Scheduling Instructions:     Needs to be placed on my schedule 1 hour before MRI for intra-articular contrast injection.    Order Specific Question:   Reason for exam:    Answer:   MR Arthrogram with intra-articular contrast.  Assess labral pathology. I have already placed gadolinium into the glenohumeral joint under  ultrasound guidance.    Order Specific Question:   Preferred imaging location?    Answer:   [facility] (table limit-350lbs)    Order Specific Question:   Does the patient have a pacemaker or implanted devices?    Answer:   No    Order Specific Question:   What is the patient's sedation requirement?    Answer:   No Sedation    Discussed warning signs or symptoms. Please see discharge instructions. Patient expresses understanding.

## 2016-04-22 ENCOUNTER — Encounter: Payer: Self-pay | Admitting: Family Medicine

## 2016-04-28 ENCOUNTER — Ambulatory Visit (INDEPENDENT_AMBULATORY_CARE_PROVIDER_SITE_OTHER): Payer: 59

## 2016-04-28 ENCOUNTER — Ambulatory Visit (INDEPENDENT_AMBULATORY_CARE_PROVIDER_SITE_OTHER): Payer: 59 | Admitting: Family Medicine

## 2016-04-28 ENCOUNTER — Encounter: Payer: Self-pay | Admitting: Family Medicine

## 2016-04-28 VITALS — BP 138/82 | HR 67

## 2016-04-28 DIAGNOSIS — M7581 Other shoulder lesions, right shoulder: Secondary | ICD-10-CM

## 2016-04-28 DIAGNOSIS — M25511 Pain in right shoulder: Secondary | ICD-10-CM | POA: Diagnosis not present

## 2016-04-28 NOTE — Progress Notes (Signed)
          Patient presents to clinic for  previously scheduled gadolinium interarticular contrast.  Procedure: Real-time Ultrasound Guided Injection of right GH joint  Device: GE Logiq E  Images permanently stored and available for review in the ultrasound unit. Verbal informed consent obtained. Discussed risks and benefits of procedure. Warned about infection bleeding damage to structures skin hypopigmentation and fat atrophy among others. Patient expresses understanding and agreement Time-out conducted.  Noted no overlying erythema, induration, or other signs of local infection.  Skin prepped in a sterile fashion.  Local anesthesia: Topical Ethyl chloride.  With sterile technique and under real time ultrasound guidance: 4ml lidocaine, 40mg  kenalog, 0.34ml gadolinium and 65ml sterile saline injected easily.  Completed without difficulty    Advised to call if fevers/chills, erythema, induration, drainage, or persistent bleeding.  Images permanently stored and available for review in the ultrasound unit.  Impression: Technically successful ultrasound guided injection.

## 2016-04-28 NOTE — Patient Instructions (Signed)
Thank you for coming in today.   Call or go to the ER if you develop a large red swollen joint with extreme pain or oozing puss.   

## 2016-04-29 ENCOUNTER — Encounter: Payer: Self-pay | Admitting: Family Medicine

## 2016-05-06 ENCOUNTER — Encounter: Payer: Self-pay | Admitting: Family Medicine

## 2016-05-06 DIAGNOSIS — E349 Endocrine disorder, unspecified: Secondary | ICD-10-CM

## 2016-05-06 DIAGNOSIS — E032 Hypothyroidism due to medicaments and other exogenous substances: Secondary | ICD-10-CM

## 2016-05-08 ENCOUNTER — Encounter: Payer: Self-pay | Admitting: Family Medicine

## 2016-05-08 LAB — TESTOSTERONE TOTAL,FREE,BIO, MALES
ALBUMIN: 4.3 g/dL (ref 3.6–5.1)
Sex Hormone Binding: 27 nmol/L (ref 10–50)
TESTOSTERONE BIOAVAILABLE: 99.3 ng/dL — AB (ref 110.0–575.0)
Testosterone, Free: 50.4 pg/mL (ref 46.0–224.0)
Testosterone: 331 ng/dL (ref 250–827)

## 2016-05-08 LAB — TSH: TSH: 44.66 mIU/L — ABNORMAL HIGH (ref 0.40–4.50)

## 2016-05-12 ENCOUNTER — Ambulatory Visit (INDEPENDENT_AMBULATORY_CARE_PROVIDER_SITE_OTHER): Payer: 59 | Admitting: Family Medicine

## 2016-05-12 DIAGNOSIS — Z8639 Personal history of other endocrine, nutritional and metabolic disease: Secondary | ICD-10-CM | POA: Diagnosis not present

## 2016-05-12 DIAGNOSIS — E890 Postprocedural hypothyroidism: Secondary | ICD-10-CM

## 2016-05-12 DIAGNOSIS — E039 Hypothyroidism, unspecified: Secondary | ICD-10-CM | POA: Insufficient documentation

## 2016-05-12 NOTE — Patient Instructions (Addendum)
Thank you for coming in today. Get labs today.  We may adjust medicine is labs are still wacky.  You should hear from Seth Bake or me soon about testosterone labs.   Thyroid-Stimulating Hormone Test Why am I having this test? A thyroid-stimulating hormone (TSH) test is a blood test that is done to measure the level of TSH, also known as thyrotropin, in your blood. TSH is produced by the pituitary gland. The pituitary gland is a small organ located just below the brain, behind your eyes and nasal passages. It is part of a system that monitors and maintains thyroid hormone levels and thyroid gland function. Thyroid hormones affect many body parts and systems, including the system that affects how quickly your body burns fuel for energy. Your health care provider may recommend testing your TSH level if you have signs and symptoms of abnormal thyroid hormone levels. Knowing the level of TSH in your blood can help your health care provider:  Diagnose a thyroid gland or pituitary gland disorder.  Manage your condition and treatment if you have hypothyroidism or hyperthyroidism. What kind of sample is taken? A blood sample is required for this test. It is usually collected by inserting a needle into a vein. How do I prepare for this test? There is no preparation required for this test. What are the reference ranges? Reference rangesare considered healthy rangesestablished after testing a large group of healthy people. Reference rangesmay vary among different people, labs, and hospitals. It is your responsibility to obtain your test results. Ask the lab or department performing the test when and how you will get your results. Range of Normal Values:  Adult: 0.3-5 microunits/mL or 0.3-5 milliunits/L (SI units).  Newborn: 83-18 microunits/mL or 3-18 milliunits/L.  Cord: 3-12 microunits/mL or 3-12 milliunits/L. What do the results mean? A high level of TSH may mean:  Your thyroid gland is not making  enough thyroid hormones. When the thyroid gland does not make enough thyroid hormones, the pituitary gland releases TSH into the bloodstream. The higher-than-normal levels of TSH prompt the thyroid gland to release more thyroid hormones.  You are getting an insufficient level of thyroid hormone medicine, if you are receiving this type of treatment.  There is a problem with the pituitary gland (rare). A low level of TSH can indicate a problem with the pituitary gland. Talk with your health care provider to discuss your results, treatment options, and if necessary, the need for more tests. Talk with your health care provider if you have any questions about your results. Talk with your health care provider to discuss your results, treatment options, and if necessary, the need for more tests. Talk with your health care provider if you have any questions about your results. This information is not intended to replace advice given to you by your health care provider. Make sure you discuss any questions you have with your health care provider. Document Released: 02/22/2004 Document Revised: 09/30/2015 Document Reviewed: 06/22/2013 Elsevier Interactive Patient Education  2017 [employer].

## 2016-05-12 NOTE — Progress Notes (Signed)
Tyler Hancock is a 36 y.o. male who presents to [facility] [place] today for follow-up low testosterone and hypothyroidism.  He has been dealing with low testosterone for some time now. We've been trying to document need for testosterone therapy for insurance reasons.  Tyler Hancock has been off of the testosterone for a few weeks now. Fatigue and decreased libido. He feels well otherwise.  Additionally we are here to follow-up elevated TSH. 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 He's been taking levothyroxine listed below for years. As noted above he has been feeling fatigued and run down recently.   Past Medical History:  Diagnosis Date  . 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 GAD (generalized anxiety disorder) 12/19/2015  . Hypertension   . Hypotestosteronism 06/13/2014  . Major depression 06/13/2014  . Morbid obesity ([place]) 06/13/2014  . Thyroid disease    No past surgical history on file. Social History  Substance Use Topics  . Smoking status: Never Smoker  . Smokeless tobacco: Never Used  . Alcohol use 0.0 oz/week   family history includes Schizophrenia in his maternal grandfather; Stroke in his other.  ROS as above:  Medications: Current Outpatient Prescriptions  Medication Sig Dispense Refill  . B-D 3CC LUER-LOK SYR 25GX1" 25G X 1" 3 ML MISC USE AS DIRECTED 10 each 0  . B-D HYPODERMIC NEEDLE 18GX1.5" 18G X 1-1/2" MISC USE AS DIRECTED 10 each 0  . buPROPion (WELLBUTRIN XL) 150 MG 24 hr tablet Take 1 tablet (150 mg total) by mouth daily. 90 tablet 1  . buPROPion (WELLBUTRIN XL) 300 MG 24 hr tablet Take 1 tablet (300 mg total) by mouth daily. 90 tablet 1  . busPIRone (BUSPAR) 10 MG tablet Take 1 tablet (10 mg total) by mouth daily. 90 tablet 1  . diclofenac sodium (VOLTAREN) 1 % GEL Apply 2 g topically 4 (four) times daily. To affected joint. 100 g 11  .  fluticasone (FLONASE) 50 MCG/ACT nasal spray One spray in each nostril twice a day, use left hand for right nostril, and right hand for left nostril. 48 g 3  . levothyroxine (SYNTHROID, LEVOTHROID) 175 MCG tablet Take 1 tablet (175 mcg total) by mouth daily. 90 tablet 1  . losartan (COZAAR) 100 MG tablet Take 1 tablet (100 mg total) by mouth daily. 90 tablet 1  . naproxen (NAPROSYN) 500 MG tablet Take 1 tablet (500 mg total) by mouth 2 (two) times daily with a meal. 60 tablet 0  . sertraline (ZOLOFT) 100 MG tablet Take 2 tablets (200 mg total) by mouth daily. 180 tablet 1  . SYRINGE DISPOSABLE 3CC 3 ML MISC Use 1 syringe per week with testosterone injection. 25 each prn  . testosterone cypionate (DEPO-TESTOSTERONE) 200 MG/ML injection Inject 0.75 mLs (150 mg total) into the muscle every 7 (seven) days. 12 mL 1   No current facility-administered medications for this visit.    No Known Allergies  Health Maintenance Health Maintenance  Topic Date Due  . HIV Screening  07/19/1997  . INFLUENZA VACCINE  09/10/2016  . TETANUS/TDAP  03/29/2018     Exam:  Hr 80 bpm Gen: Well NAD HEENT: EOMI,  MMM Lungs: Normal work of breathing. CTABL Heart: RRR no MRG Abd: NABS, Soft. Nondistended, Nontender Exts: Brisk capillary refill, warm and well perfused.    Component     Latest Ref Rng & Units 05/06/2016          Testosterone  250 - 827 ng/dL 331  Albumin     3.6 - 5.1 g/dL 4.3  Sex Horm Binding Glob, Serum     10 - 50 nmol/L 27  Testosterone Free     46.0 - 224.0 pg/mL 50.4  Testosterone, Bioavailable     110.0 - 575.0 ng/dL 99.3 (L)   Lab Results  Component Value Date   TSH 44.66 (H) 05/06/2016      No results found for this or any previous visit (from the past 72 hour(s)). No results found.    Assessment and Plan: 36 y.o. male with  Low testosterone: Will try to prior authorize testosterone.  Recheck in a few months.   Elevated TSH: Will recheck labs to confirm.    Will titrate levothyroxine PRN.    Orders Placed This Encounter  Procedures  . T3, free  . T4, free  . TSH   No orders of the defined types were placed in this encounter.    Discussed warning signs or symptoms. Please see discharge instructions. Patient expresses understanding.

## 2016-05-13 ENCOUNTER — Encounter: Payer: Self-pay | Admitting: Family Medicine

## 2016-05-13 ENCOUNTER — Telehealth: Payer: Self-pay | Admitting: *Deleted

## 2016-05-13 ENCOUNTER — Encounter: Payer: Self-pay | Admitting: *Deleted

## 2016-05-13 LAB — TSH: TSH: 17.13 mIU/L — ABNORMAL HIGH (ref 0.40–4.50)

## 2016-05-13 LAB — T3, FREE: T3, Free: 2.8 pg/mL (ref 2.3–4.2)

## 2016-05-13 LAB — T4, FREE: Free T4: 1.1 ng/dL (ref 0.8–1.8)

## 2016-05-13 MED ORDER — LEVOTHYROXINE SODIUM 200 MCG PO TABS: 200.0000 ug | ORAL_TABLET | Freq: Every day | ORAL | 1 refills | Status: DC

## 2016-05-13 NOTE — Telephone Encounter (Signed)
OCAREQ:[id number];QNETUY:[id number];Review Type:Prior Auth;Appeal Information: Attention:ATTN: Sullivan [id number]. FFKVQ,[place],[id number] DKEUV:[phone] EAT:[phone];

## 2016-05-13 NOTE — Telephone Encounter (Signed)
Patient now has one low testosterone and a low normal on file now. I do notice however, it looks like he now has [employer], which was just scanned in yesterday. I think this is now a different insurance because ID # on card is different than the one in covermymeds, Will submitt appeal from info I used previously  and see if this comes back. The info used previously was most likely already inputted by the pharm. If response comes back unable to process because patient not found then will have to submitt a new PA with new card information.  [id number];FFMBWG:[id number];Review Type:Prior Auth;Appeal Information: Attention:ATTN: Ramsey [id number]. TTSVX,BL,[id number] RAQTM:[phone] YBW:[phone];

## 2016-05-13 NOTE — Telephone Encounter (Signed)
See other phone note

## 2016-05-14 MED ORDER — LEVOTHYROXINE SODIUM 200 MCG PO TABS: 200.0000 ug | ORAL_TABLET | Freq: Every day | ORAL | 1 refills | Status: DC

## 2016-05-23 NOTE — Telephone Encounter (Signed)
Wife called to check on the status of appeal. [employer] and they state the appeal has gone to their third Arboriculturist. Their turn around time is 30 days. I left this information on the wifes vm. I also stated that if the patient would like he could call the insurance company and add to the appeal but telling him how he is feeling and it may or may not speed up the process. Asked that she call me back if any other questions.

## 2016-05-29 ENCOUNTER — Other Ambulatory Visit: Payer: Self-pay

## 2016-05-29 MED ORDER — TESTOSTERONE CYPIONATE 200 MG/ML IM SOLN: 150.0000 mg | INTRAMUSCULAR | 1 refills | Status: DC

## 2016-05-29 NOTE — Telephone Encounter (Signed)
pts wife called stating that her insurance notified them that the PA for testosterone has been approved.

## 2016-07-21 ENCOUNTER — Encounter: Payer: Self-pay | Admitting: Family Medicine

## 2016-07-21 DIAGNOSIS — R7989 Other specified abnormal findings of blood chemistry: Secondary | ICD-10-CM

## 2016-07-21 DIAGNOSIS — E039 Hypothyroidism, unspecified: Secondary | ICD-10-CM

## 2016-07-28 ENCOUNTER — Ambulatory Visit: Payer: Self-pay | Admitting: Family Medicine

## 2016-07-28 LAB — CBC
HCT: 49.6 % (ref 38.5–50.0)
HEMOGLOBIN: 16.8 g/dL (ref 13.2–17.1)
MCH: 31.2 pg (ref 27.0–33.0)
MCHC: 33.9 g/dL (ref 32.0–36.0)
MCV: 92 fL (ref 80.0–100.0)
MPV: 11.9 fL (ref 7.5–12.5)
Platelets: 185 10*3/uL (ref 140–400)
RBC: 5.39 MIL/uL (ref 4.20–5.80)
RDW: 13.6 % (ref 11.0–15.0)
WBC: 8.9 10*3/uL (ref 3.8–10.8)

## 2016-07-29 LAB — COMPLETE METABOLIC PANEL WITH GFR
ALBUMIN: 4.2 g/dL (ref 3.6–5.1)
ALT: 25 U/L (ref 9–46)
AST: 16 U/L (ref 10–40)
Alkaline Phosphatase: 49 U/L (ref 40–115)
BUN: 14 mg/dL (ref 7–25)
CALCIUM: 9.1 mg/dL (ref 8.6–10.3)
CHLORIDE: 103 mmol/L (ref 98–110)
CO2: 29 mmol/L (ref 20–31)
CREATININE: 0.96 mg/dL (ref 0.60–1.35)
GFR, Est African American: >89 mL/min (ref >=60)
GFR, Est Non African American: >89 mL/min (ref >=60)
Glucose, Bld: 84 mg/dL (ref 65–99)
POTASSIUM: 4.4 mmol/L (ref 3.5–5.3)
Sodium: 140 mmol/L (ref 135–146)
TOTAL PROTEIN: 6.8 g/dL (ref 6.1–8.1)
Total Bilirubin: 0.5 mg/dL (ref 0.2–1.2)

## 2016-07-29 LAB — T3, FREE: T3, Free: 3.6 pg/mL (ref 2.3–4.2)

## 2016-07-29 LAB — TESTOSTERONE: Testosterone: 757 ng/dL (ref 250–827)

## 2016-07-29 LAB — TSH: TSH: 5.93 mIU/L — ABNORMAL HIGH (ref 0.40–4.50)

## 2016-07-29 LAB — T4, FREE: Free T4: 1.4 ng/dL (ref 0.8–1.8)

## 2016-07-29 MED ORDER — LEVOTHYROXINE SODIUM 25 MCG PO TABS: 25.0000 ug | ORAL_TABLET | Freq: Every day | ORAL | 1 refills | Status: DC

## 2016-07-29 MED ORDER — LEVOTHYROXINE SODIUM 200 MCG PO TABS: 200.0000 ug | ORAL_TABLET | Freq: Every day | ORAL | 1 refills | Status: DC

## 2016-07-29 NOTE — Addendum Note (Signed)
Addended by: Gregor Hams on: 07/29/2016 06:44 AM   Modules accepted: Orders

## 2016-07-30 ENCOUNTER — Encounter: Payer: Self-pay | Admitting: Family Medicine

## 2016-07-30 ENCOUNTER — Ambulatory Visit (INDEPENDENT_AMBULATORY_CARE_PROVIDER_SITE_OTHER): Payer: 59 | Admitting: Family Medicine

## 2016-07-30 VITALS — BP 135/84 | HR 87 | Resp 18 | Wt 384.0 lb

## 2016-07-30 DIAGNOSIS — R5383 Other fatigue: Secondary | ICD-10-CM | POA: Diagnosis not present

## 2016-07-30 DIAGNOSIS — E349 Endocrine disorder, unspecified: Secondary | ICD-10-CM

## 2016-07-30 DIAGNOSIS — E890 Postprocedural hypothyroidism: Secondary | ICD-10-CM | POA: Diagnosis not present

## 2016-07-30 DIAGNOSIS — R61 Generalized hyperhidrosis: Secondary | ICD-10-CM | POA: Diagnosis not present

## 2016-07-30 MED ORDER — ESOMEPRAZOLE MAGNESIUM 40 MG PO CPDR: 40.0000 mg | DELAYED_RELEASE_CAPSULE | Freq: Every day | ORAL | 3 refills | Status: DC

## 2016-07-30 NOTE — Patient Instructions (Addendum)
Thank you for coming in today. Please work on weight loss by Tyler Hancock.  We will continue to follow sweating and fatigue.  I think sleep apnea is likely.  You should hear about the sleep study.  Recheck in 1-2 months.   Make sure to measure your food.    Sleep Apnea Sleep apnea is a condition in which breathing pauses or becomes shallow during sleep. Episodes of sleep apnea usually last 10 seconds or longer, and they may occur as many as 20 times an hour. Sleep apnea disrupts your sleep and keeps your body from getting the rest that it needs. This condition can increase your risk of certain health problems, including:  Heart attack.  Stroke.  Obesity.  Diabetes.  Heart failure.  Irregular heartbeat.  There are three kinds of sleep apnea:  Obstructive sleep apnea. This kind is caused by a blocked or collapsed airway.  Central sleep apnea. This kind happens when the part of the brain that controls breathing does not send the correct signals to the muscles that control breathing.  Mixed sleep apnea. This is a combination of obstructive and central sleep apnea.  What are the causes? The most common cause of this condition is a collapsed or blocked airway. An airway can collapse or become blocked if:  Your throat muscles are abnormally relaxed.  Your tongue and tonsils are larger than normal.  You are overweight.  Your airway is smaller than normal.  What increases the risk? This condition is more likely to develop in people who:  Are overweight.  Smoke.  Have a smaller than normal airway.  Are elderly.  Are male.  Drink alcohol.  Take sedatives or tranquilizers.  Have a family history of sleep apnea.  What are the signs or symptoms? Symptoms of this condition include:  Trouble staying asleep.  Daytime sleepiness and tiredness.  Irritability.  Loud snoring.  Morning headaches.  Trouble concentrating.  Forgetfulness.  Decreased interest in  sex.  Unexplained sleepiness.  Mood swings.  Personality changes.  Feelings of depression.  Waking up often during the night to urinate.  Dry mouth.  Sore throat.  How is this diagnosed? This condition may be diagnosed with:  A medical history.  A physical exam.  A series of tests that are done while you are sleeping (sleep study). These tests are usually done in a sleep lab, but they may also be done at home.  How is this treated? Treatment for this condition aims to restore normal breathing and to ease symptoms during sleep. It may involve managing health issues that can affect breathing, such as high blood pressure or obesity. Treatment may include:  Sleeping on your side.  Using a decongestant if you have nasal congestion.  Avoiding the use of depressants, including alcohol, sedatives, and narcotics.  Losing weight if you are overweight.  Making changes to your diet.  Quitting smoking.  Using a device to open your airway while you sleep, such as: ? An oral appliance. This is a custom-made mouthpiece that shifts your lower jaw forward. ? A continuous positive airway pressure (CPAP) device. This device delivers oxygen to your airway through a mask. ? A nasal expiratory positive airway pressure (EPAP) device. This device has valves that you put into each nostril. ? A bi-level positive airway pressure (BPAP) device. This device delivers oxygen to your airway through a mask.  Surgery if other treatments do not work. During surgery, excess tissue is removed to create a wider airway.  It is important to get treatment for sleep apnea. Without treatment, this condition can lead to:  High blood pressure.  Coronary artery disease.  (Men) An inability to achieve or maintain an erection (impotence).  Reduced thinking abilities.  Follow these instructions at home:  Make any lifestyle changes that your health care provider recommends.  Eat a healthy, well-balanced  diet.  Take over-the-counter and prescription medicines only as told by your health care provider.  Avoid using depressants, including alcohol, sedatives, and narcotics.  Take steps to lose weight if you are overweight.  If you were given a device to open your airway while you sleep, use it only as told by your health care provider.  Do not use any tobacco products, such as cigarettes, chewing tobacco, and e-cigarettes. If you need help quitting, ask your health care provider.  Keep all follow-up visits as told by your health care provider. This is important. Contact a health care provider if:  The device that you received to open your airway during sleep is uncomfortable or does not seem to be working.  Your symptoms do not improve.  Your symptoms get worse. Get help right away if:  You develop chest pain.  You develop shortness of breath.  You develop discomfort in your back, arms, or stomach.  You have trouble speaking.  You have weakness on one side of your body.  You have drooping in your face. These symptoms may represent a serious problem that is an emergency. Do not wait to see if the symptoms will go away. Get medical help right away. Call your local emergency services (911 in the [place] Do not drive yourself to the hospital. This information is not intended to replace advice given to you by your health care provider. Make sure you discuss any questions you have with your health care provider. Document Released: 01/17/2002 Document Revised: 09/23/2015 Document Reviewed: 11/06/2014 Elsevier Interactive Patient Education  Tyler Hancock.

## 2016-07-30 NOTE — Progress Notes (Signed)
Tyler Hancock is a 36 y.o. male who presents to [place]: [place] today for fatigue and sweating. Patient notes of the past several months worsening fatigue and increased sweating. He notes that he needs to sleep more than usual. He denies any fevers or chills vomiting or diarrhea. He notes in the past he was found to have mild sleep apnea not requiring CPAP. He's gained a bit of weight since then and thinks possibly sleep apnea may be a problem.  Additionally he notes sweating more than usual. He notes sweating even with mild exertion which is not usual for him. He denies chest pain palpitations or shortness of breath.  Additionally patient notes that his acid reflux has worsened. Previously he was treated with Nexium but ran out and notes returning as reflux after he ran out of Nexium.  Past Medical History:  Diagnosis Date  . 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 GAD (generalized anxiety disorder) 12/19/2015  . Hypertension   . Hypotestosteronism 06/13/2014  . Major depression 06/13/2014  . Morbid obesity ([place]) 06/13/2014  . Thyroid disease    No past surgical history on file. Social History  Substance Use Topics  . Smoking status: Never Smoker  . Smokeless tobacco: Never Used  . Alcohol use 0.0 oz/week   family history includes Schizophrenia in his maternal grandfather; Stroke in his other.  ROS as above:  Medications: Current Outpatient Prescriptions  Medication Sig Dispense Refill  . esomeprazole (NEXIUM) 40 MG capsule Take 1 capsule (40 mg total) by mouth daily. 90 capsule 3  . B-D 3CC LUER-LOK SYR 25GX1" 25G X 1" 3 ML MISC USE AS DIRECTED 10 each 0  . B-D HYPODERMIC NEEDLE 18GX1.5" 18G X 1-1/2" MISC USE AS DIRECTED 10 each 0  . buPROPion (WELLBUTRIN XL) 150 MG 24 hr tablet Take 1 tablet (150 mg total) by mouth daily. 90 tablet 1  . buPROPion (WELLBUTRIN XL) 300  MG 24 hr tablet Take 1 tablet (300 mg total) by mouth daily. 90 tablet 1  . busPIRone (BUSPAR) 10 MG tablet Take 1 tablet (10 mg total) by mouth daily. 90 tablet 1  . diclofenac sodium (VOLTAREN) 1 % GEL Apply 2 g topically 4 (four) times daily. To affected joint. 100 g 11  . fluticasone (FLONASE) 50 MCG/ACT nasal spray One spray in each nostril twice a day, use left hand for right nostril, and right hand for left nostril. 48 g 3  . levothyroxine (SYNTHROID) 200 MCG tablet Take 1 tablet (200 mcg total) by mouth daily before breakfast. 90 tablet 1  . losartan (COZAAR) 100 MG tablet Take 1 tablet (100 mg total) by mouth daily. 90 tablet 1  . naproxen (NAPROSYN) 500 MG tablet Take 1 tablet (500 mg total) by mouth 2 (two) times daily with a meal. 60 tablet 0  . sertraline (ZOLOFT) 100 MG tablet Take 2 tablets (200 mg total) by mouth daily. 180 tablet 1  . SYRINGE DISPOSABLE 3CC 3 ML MISC Use 1 syringe per week with testosterone injection. 25 each prn  . testosterone cypionate (DEPO-TESTOSTERONE) 200 MG/ML injection Inject 0.75 mLs (150 mg total) into the muscle every 7 (seven) days. 12 mL 1   No current facility-administered medications for this visit.    No Known Allergies  Health Maintenance Health Maintenance  Topic Date Due  . HIV Screening  07/19/1997  . INFLUENZA VACCINE  09/10/2016  . TETANUS/TDAP  03/29/2018     Exam:  BP 135/84   Pulse 87   Resp 18   Wt (!) 384 lb (174.2 kg)   BMI 49.30 kg/m   Wt Readings from Last 10 Encounters:  07/30/16 (!) 384 lb (174.2 kg)  04/21/16 (!) 376 lb (170.6 kg)  12/28/15 (!) 366 lb 3.2 oz (166.1 kg)  12/19/15 (!) 356 lb (161.5 kg)  10/22/15 (!) 368 lb (166.9 kg)  10/02/15 (!) 365 lb (165.6 kg)  08/22/15 (!) 363 lb (164.7 kg)  06/26/15 (!) 361 lb (163.7 kg)  06/12/15 (!) 350 lb (158.8 kg)  05/23/15 (!) 359 lb (162.8 kg)    Gen: Well NAD HEENT: EOMI,  MMM Lungs: Normal work of breathing. CTABL Heart: RRR no MRG Abd: NABS, Soft.  Nondistended, Nontender Exts: Brisk capillary refill, warm and well perfused.      Chemistry      Component Value Date/Time   NA 140 07/28/2016 0816   K 4.4 07/28/2016 0816   CL 103 07/28/2016 0816   CO2 29 07/28/2016 0816   BUN 14 07/28/2016 0816   CREATININE 0.96 07/28/2016 0816      Component Value Date/Time   CALCIUM 9.1 07/28/2016 0816   ALKPHOS 49 07/28/2016 0816   AST 16 07/28/2016 0816   ALT 25 07/28/2016 0816   BILITOT 0.5 07/28/2016 0816     Lab Results  Component Value Date   TSH 5.93 (H) 07/28/2016   Lab Results  Component Value Date   TESTOSTERONE 757 07/28/2016   Lab Results  Component Value Date   WBC 8.9 07/28/2016   HGB 16.8 07/28/2016   HCT 49.6 07/28/2016   MCV 92.0 07/28/2016   PLT 185 07/28/2016      Assessment and Plan: 36 y.o. male with  Fatigue: Unclear etiology. Labs are relatively normal. I suspect sleep apnea due to weight gain. Plan for sleep study.  Swelling: Unclear etiology. Likely due to weight gain. Work on weight loss. Recheck labs in the near future.  Acid reflux worsening: Restart Nexium work on weight loss.  Morbid obesity: Patient continues to gain weight. We had a lengthy discussion about calorie counting. Plan for food log  and recheck in a month or so.     No orders of the defined types were placed in this encounter.  Meds ordered this encounter  Medications  . DISCONTD: esomeprazole (NEXIUM) 40 MG capsule    Sig: TAKE 1 CAPSULE BY MOUTH TWICE DAILY 30 MINUTES BEFORE EATING  . esomeprazole (NEXIUM) 40 MG capsule    Sig: Take 1 capsule (40 mg total) by mouth daily.    Dispense:  90 capsule    Refill:  3     Discussed warning signs or symptoms. Please see discharge instructions. Patient expresses understanding.  I spent 40 minutes with this patient, greater than 50% was face-to-face time counseling regarding the above diagnosis.

## 2016-07-31 DIAGNOSIS — R61 Generalized hyperhidrosis: Secondary | ICD-10-CM | POA: Insufficient documentation

## 2016-08-05 ENCOUNTER — Encounter: Payer: Self-pay | Admitting: Family Medicine

## 2016-08-26 ENCOUNTER — Encounter: Payer: Self-pay | Admitting: Family Medicine

## 2016-08-26 DIAGNOSIS — R0683 Snoring: Secondary | ICD-10-CM

## 2016-08-26 DIAGNOSIS — E785 Hyperlipidemia, unspecified: Secondary | ICD-10-CM

## 2016-08-26 DIAGNOSIS — E039 Hypothyroidism, unspecified: Secondary | ICD-10-CM

## 2016-08-27 LAB — LIPID PANEL W/REFLEX DIRECT LDL
CHOLESTEROL: 171 mg/dL (ref <200)
HDL: 44 mg/dL (ref >40)
LDL-Cholesterol: 109 mg/dL — ABNORMAL HIGH
Non-HDL Cholesterol (Calc): 127 mg/dL (ref <130)
Total CHOL/HDL Ratio: 3.9 Ratio (ref <5.0)
Triglycerides: 86 mg/dL (ref <150)

## 2016-08-27 LAB — COMPLETE METABOLIC PANEL WITH GFR
ALT: 28 U/L (ref 9–46)
AST: 23 U/L (ref 10–40)
Albumin: 4.3 g/dL (ref 3.6–5.1)
Alkaline Phosphatase: 45 U/L (ref 40–115)
BUN: 10 mg/dL (ref 7–25)
CHLORIDE: 100 mmol/L (ref 98–110)
CO2: 29 mmol/L (ref 20–31)
CREATININE: 1.02 mg/dL (ref 0.60–1.35)
Calcium: 9.2 mg/dL (ref 8.6–10.3)
GFR, Est Non African American: >89 mL/min (ref >=60)
Glucose, Bld: 84 mg/dL (ref 65–99)
Potassium: 4.3 mmol/L (ref 3.5–5.3)
Sodium: 138 mmol/L (ref 135–146)
Total Bilirubin: 0.7 mg/dL (ref 0.2–1.2)
Total Protein: 7.3 g/dL (ref 6.1–8.1)

## 2016-08-27 LAB — T4, FREE: FREE T4: 1.3 ng/dL (ref 0.8–1.8)

## 2016-08-27 LAB — TSH: TSH: 3.25 mIU/L (ref 0.40–4.50)

## 2016-08-27 LAB — T3, FREE: T3 FREE: 3.5 pg/mL (ref 2.3–4.2)

## 2016-08-28 ENCOUNTER — Ambulatory Visit (INDEPENDENT_AMBULATORY_CARE_PROVIDER_SITE_OTHER): Payer: 59 | Admitting: Family Medicine

## 2016-08-28 ENCOUNTER — Encounter: Payer: Self-pay | Admitting: Family Medicine

## 2016-08-28 VITALS — BP 134/83 | HR 77 | Ht 74.0 in | Wt 382.0 lb

## 2016-08-28 DIAGNOSIS — I10 Essential (primary) hypertension: Secondary | ICD-10-CM | POA: Diagnosis not present

## 2016-08-28 DIAGNOSIS — E349 Endocrine disorder, unspecified: Secondary | ICD-10-CM

## 2016-08-28 DIAGNOSIS — E890 Postprocedural hypothyroidism: Secondary | ICD-10-CM

## 2016-08-28 DIAGNOSIS — L989 Disorder of the skin and subcutaneous tissue, unspecified: Secondary | ICD-10-CM

## 2016-08-28 DIAGNOSIS — Z0000 Encounter for general adult medical examination without abnormal findings: Secondary | ICD-10-CM | POA: Diagnosis not present

## 2016-08-28 NOTE — Patient Instructions (Addendum)
Thank you for coming in today.  I recommend reading "A Big Fat Crisis"  Continue Lose It.   You should hear about the Sleep Study soon if not let me know.   Labs look good.   We will do the excisional biopsy soon.  Schedule a dedicated visit for that.   Keep me updated with weight loss. Let me know if you hit any snags or plateau.

## 2016-08-28 NOTE — Progress Notes (Signed)
-      Tyler Hancock is a 36 y.o. male who presents to [place]: [place] today for annual exam.  Sinus congestion: Patient reports sinus congestion and drainage for 3 weeks. He has tried Zyrtec, Claritin, and Neti pot without relief. He denies any fever, chills, myalgias, arthralgias, or productive cough.  Obesity: Patient is working to lose weight through monitoring his caloric intake and activity levels with the app Lose It. He has also made diet changes including eating more vegetables and substituting fruit for desserts. He is confident that he can continue to maintain these changes.   Sleep apnea: Patient reports feeling less rested and refreshed upon waking. He reports that his wife has observed episodes of apnea. Patient snores. He has previously been referred for a sleep study, but has not been contacted yet about further instructions for this.   Scar on leg: Patient reports a chronic scar on his lower right leg for 3 years. He has not noticed any recent changes in the color or size of the lesion. He would like to have it further evaluated since it has been 3 years and is not completely healed.     Past Medical History:  Diagnosis Date  . 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 GAD (generalized anxiety disorder) 12/19/2015  . Hypertension   . Hypotestosteronism 06/13/2014  . Major depression 06/13/2014  . Morbid obesity ([place]) 06/13/2014  . Thyroid disease    No past surgical history on file. Social History  Substance Use Topics  . Smoking status: Never Smoker  . Smokeless tobacco: Never Used  . Alcohol use 0.0 oz/week   family history includes Schizophrenia in his maternal grandfather; Stroke in his other.  ROS as above:  Medications: Current Outpatient Prescriptions  Medication Sig Dispense Refill  . B-D 3CC LUER-LOK SYR 25GX1" 25G X 1" 3 ML MISC USE AS DIRECTED 10  each 0  . B-D HYPODERMIC NEEDLE 18GX1.5" 18G X 1-1/2" MISC USE AS DIRECTED 10 each 0  . buPROPion (WELLBUTRIN XL) 150 MG 24 hr tablet Take 1 tablet (150 mg total) by mouth daily. 90 tablet 1  . buPROPion (WELLBUTRIN XL) 300 MG 24 hr tablet Take 1 tablet (300 mg total) by mouth daily. 90 tablet 1  . busPIRone (BUSPAR) 10 MG tablet Take 1 tablet (10 mg total) by mouth daily. 90 tablet 1  . diclofenac sodium (VOLTAREN) 1 % GEL Apply 2 g topically 4 (four) times daily. To affected joint. 100 g 11  . esomeprazole (NEXIUM) 40 MG capsule Take 1 capsule (40 mg total) by mouth daily. 90 capsule 3  . fluticasone (FLONASE) 50 MCG/ACT nasal spray One spray in each nostril twice a day, use left hand for right nostril, and right hand for left nostril. 48 g 3  . levothyroxine (SYNTHROID) 200 MCG tablet Take 1 tablet (200 mcg total) by mouth daily before breakfast. 90 tablet 1  . losartan (COZAAR) 100 MG tablet Take 1 tablet (100 mg total) by mouth daily. 90 tablet 1  . naproxen (NAPROSYN) 500 MG tablet Take 1 tablet (500 mg total) by mouth 2 (two) times daily with a meal. 60 tablet 0  . sertraline (ZOLOFT) 100 MG tablet Take 2 tablets (200 mg total) by mouth daily. 180 tablet 1  . SYRINGE DISPOSABLE 3CC 3 ML MISC Use 1 syringe per week with testosterone injection. 25 each prn  . testosterone cypionate (DEPO-TESTOSTERONE) 200 MG/ML injection Inject 0.75 mLs (150 mg total) into the  muscle every 7 (seven) days. 12 mL 1   No current facility-administered medications for this visit.    No Known Allergies  Health Maintenance Health Maintenance  Topic Date Due  . HIV Screening  07/19/1997  . INFLUENZA VACCINE  09/10/2016  . TETANUS/TDAP  03/29/2018     Exam:  BP 134/83   Pulse 77   Ht 6\' 2"  (1.88 m)   Wt (!) 382 lb (173.3 kg)   BMI 49.05 kg/m  Gen: Well NAD HEENT: EOMI,  MMM Lungs: Normal work of breathing. CTABL Heart: RRR no MRG Abd: NABS, Soft. Nondistended, Nontender Exts: Brisk capillary  refill, warm and well perfused. Skin: Small erythematous scaly macule right anterior shin less than 1 cm.  Skin:Lipid Panel w/reflex Direct LDL     Status: Abnormal   Collection Time: 08/27/16  7:59 AM  Result Value Ref Range   Cholesterol 171 <200 mg/dL   Triglycerides 86 <150 mg/dL   HDL 44 >40 mg/dL   Total CHOL/HDL Ratio 3.9 <5.0 Ratio   Non-HDL Cholesterol (Calc) 127 <130 mg/dL    Comment:   For patients with diabetes plus 1 major ASCVD risk factor, treating to a non-HDL-C goal of <100 mg/dL (LDL-C of <70 mg/dL) is considered a therapeutic option.      LDL-Cholesterol 109 (H) mg/dL    Comment: Reference range: <100   Desirable range <100 mg/dL for primary prevention; <70 mg/dL for patients with CHD or diabetic patients with > or = 2 CHD risk factors.     The Martin-Hopkins calculation is a validated novel method that provides better accuracy than the Friedwald equation in the estimation of LDL-C, particularly when TG levels are 150-400 mg/dL and LDL-C levels are lower than 70 mg/dL. Reference:  Cresenciano Genre et al.  Comparison of a Novel Method vs the Aurelio Jew for Estimating Low-Density Lipoprotein Cholesterol Levels From the Standard Lipid Profile.  JAMA. 6213;086(57): 2061-2068.   For additional information, please refer to http://education.[facility].com/faq/FAQ164 (This link is being provided for informational/educational purposes only.)   TSH     Status: None   Collection Time: 08/27/16  7:59 AM  Result Value Ref Range   TSH 3.25 0.40 - 4.50 mIU/L  T4, free     Status: None   Collection Time: 08/27/16  7:59 AM  Result Value Ref Range   Free T4 1.3 0.8 - 1.8 ng/dL  T3, free     Status: None   Collection Time: 08/27/16  7:59 AM  Result Value Ref Range   T3, Free 3.5 2.3 - 4.2 pg/mL  COMPLETE METABOLIC PANEL WITH GFR     Status: None   Collection Time: 08/27/16  7:59 AM  Result Value Ref Range   Sodium 138 135 - 146 mmol/L   Potassium 4.3 3.5 -  5.3 mmol/L   Chloride 100 98 - 110 mmol/L   CO2 29 20 - 31 mmol/L   Glucose, Bld 84 65 - 99 mg/dL   BUN 10 7 - 25 mg/dL   Creat 1.02 0.60 - 1.35 mg/dL   Total Bilirubin 0.7 0.2 - 1.2 mg/dL   Alkaline Phosphatase 45 40 - 115 U/L   AST 23 10 - 40 U/L   ALT 28 9 - 46 U/L   Total Protein 7.3 6.1 - 8.1 g/dL   Albumin 4.3 3.6 - 5.1 g/dL   Calcium 9.2 8.6 - 10.3 mg/dL   GFR, Est African American >89 >=60 mL/min   GFR, Est Non African American >89 >=60  mL/min   No results found.    Assessment and Plan: 36 y.o. male presenting for annual exam. Doing well. Plan emphasized weight loss by calorie counting.   His sinus congestion is most likely secondary to a viral illness. Patient was instructed that this will likely resolve within the next 3 weeks. He was also instructed that Flonase may be beneficial in decreasing his inflammation and drainage. Weight loss methods and goals were also discussed. Patient is making appropriate dietary adjustments. Patient will be contacted about in-home sleep study.   Excisional biopsy of chronic lower leg scar was discussed with patient. This will be completed at a later visit.    No orders of the defined types were placed in this encounter.  No orders of the defined types were placed in this encounter.    Discussed warning signs or symptoms. Please see discharge instructions. Patient expresses understanding.

## 2016-09-05 ENCOUNTER — Encounter: Payer: Self-pay | Admitting: Family Medicine

## 2016-09-05 ENCOUNTER — Ambulatory Visit (INDEPENDENT_AMBULATORY_CARE_PROVIDER_SITE_OTHER): Payer: 59 | Admitting: Family Medicine

## 2016-09-05 VITALS — BP 138/77 | HR 79 | Temp 98.1°F | Ht 75.0 in | Wt 387.7 lb

## 2016-09-05 DIAGNOSIS — L989 Disorder of the skin and subcutaneous tissue, unspecified: Secondary | ICD-10-CM

## 2016-09-05 NOTE — Patient Instructions (Signed)
Thank you for coming in today. Return for suture removal in 10-14 days.  Keep the wound clean and covered in ointment.  Showering is ok.   Return sooner if needed.     Sutured Wound Care Sutures are stitches that can be used to close wounds. Taking care of your wound properly can help prevent pain and infection. It can also help your wound to heal more quickly. How is this treated? Wound Care  Keep the wound clean and dry.  If you were given a bandage (dressing), change it at least one time per day or as told by your doctor. You should also change it if it gets wet or dirty.  Keep the wound completely dry for the first 24 hours or as told by your doctor. After that time, you may shower or bathe. However, make sure that the wound is not soaked in water until the sutures have been removed.  Clean the wound one time each day or as told by your doctor. ? Wash the wound with soap and water. ? Rinse the wound with water to remove all soap. ? Pat the wound dry with a clean towel. Do not rub the wound.  After cleaning the wound, put a thin layer of antibiotic ointment on it as told your doctor. This ointment: ? Helps to prevent infection. ? Keeps the bandage from sticking to the wound.  Have the sutures removed as told by your doctor. General Instructions  Take or apply medicines only as told by your doctor.  To help prevent scarring, make sure to cover your wound with sunscreen whenever you are outside after the sutures are removed and the wound is healed. Make sure to wear a sunscreen of at least 30 SPF.  If you were prescribed an antibiotic medicine or ointment, finish all of it even if you start to feel better.  Do not scratch or pick at the wound.  Keep all follow-up visits as told by your doctor. This is important.  Check your wound every day for signs of infection. Watch for: ? Redness, swelling, or pain. ? Fluid, blood, or pus.  Raise (elevate) the injured area above the  level of your heart while you are sitting or lying down, if possible.  Avoid stretching your wound.  Drink enough fluids to keep your pee (urine) clear or pale yellow. Contact a doctor if:  You were given a tetanus shot and you have any of these where the needle went in: ? Swelling. ? Very bad pain. ? Redness. ? Bleeding.  You have a fever.  A wound that was closed breaks open.  You notice a bad smell coming from the wound.  You notice something coming out of the wound, such as wood or glass.  Medicine does not help your pain.  You have any of these at the site of the wound. ? More redness. ? More swelling. ? More pain.  You have any of these coming from the wound. ? Fluid. ? Blood. ? Pus.  You notice a change in the color of your skin near the wound.  You need to change the bandage often due to fluid, blood, or pus coming from the wound.  You have a new rash.  You have numbness around the wound. Get help right away if:  You have very bad swelling around the wound.  Your pain suddenly gets worse and is very bad.  You have painful lumps near the wound or on skin that is  anywhere on your body.  You have a red streak going away from the wound.  The wound is on your hand or foot and you cannot move a finger or toe like normal.  The wound is on your hand or foot and you notice that your fingers or toes look pale or bluish. This information is not intended to replace advice given to you by your health care provider. Make sure you discuss any questions you have with your health care provider. Document Released: 07/16/2007 Document Revised: 07/05/2015 Document Reviewed: 09/08/2012 Elsevier Interactive Patient Education  2017 [employer].

## 2016-09-05 NOTE — Progress Notes (Signed)
Patient return to clinic today for previously arranged excisional biopsy of non-healing wound on the right shin for several years.   Right Leg: Small 84mm flat crusted circular lesion.  Excisional biopsy: Consent obtained and timeout performed. Skin cleaned with chlorhexidine cold spray applied and 6 mL of lidocaine with epinephrine injected achieving good anesthesia. Elliptical excision was made undermining the lesion. 4 horizontal mattress sutures were used to close the wound Antibiotic ointment, clean gauze and an Ace wrap were used as a dressing.  Return in 10-14 days for suture removal

## 2016-09-15 ENCOUNTER — Encounter: Payer: Self-pay | Admitting: Family Medicine

## 2016-09-15 DIAGNOSIS — G4733 Obstructive sleep apnea (adult) (pediatric): Secondary | ICD-10-CM

## 2016-09-17 ENCOUNTER — Encounter: Payer: Self-pay | Admitting: Family Medicine

## 2016-09-17 DIAGNOSIS — G4733 Obstructive sleep apnea (adult) (pediatric): Secondary | ICD-10-CM | POA: Insufficient documentation

## 2016-09-17 MED ORDER — AMBULATORY NON FORMULARY MEDICATION: 0 refills | Status: DC

## 2016-09-22 ENCOUNTER — Telehealth: Payer: Self-pay | Admitting: Family Medicine

## 2016-09-22 NOTE — Telephone Encounter (Addendum)
FYI: [facility] called. Patient did not follow though with getting a Sleep Machine because he's having issues paying full amount up front.

## 2016-09-25 ENCOUNTER — Ambulatory Visit (INDEPENDENT_AMBULATORY_CARE_PROVIDER_SITE_OTHER): Payer: 59 | Admitting: Family Medicine

## 2016-09-25 ENCOUNTER — Encounter: Payer: Self-pay | Admitting: Family Medicine

## 2016-09-25 VITALS — BP 139/76 | HR 75 | Wt 390.0 lb

## 2016-09-25 DIAGNOSIS — E890 Postprocedural hypothyroidism: Secondary | ICD-10-CM | POA: Diagnosis not present

## 2016-09-25 DIAGNOSIS — D239 Other benign neoplasm of skin, unspecified: Secondary | ICD-10-CM | POA: Diagnosis not present

## 2016-09-25 MED ORDER — LEVOTHYROXINE SODIUM 200 MCG PO TABS: 200.0000 ug | ORAL_TABLET | Freq: Every day | ORAL | 1 refills | Status: DC

## 2016-09-25 NOTE — Patient Instructions (Signed)
Thank you for coming in today. We will try switching to a different brand of Levothyroxine We should recheck thyroid in 3 months or so.  The wound should not bother you.

## 2016-09-25 NOTE — Progress Notes (Signed)
Tyler Hancock is a 36 y.o. male who presents to [facility] [place]: [place] today for follow-up leg wound. Patient was seen previously for nonhealing wound on the right leg. 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 He notes the wound is doing well and is here for suture removal.  Additionally patient takes levothyroxine as part of a recall. He feels well with his current dose.  Additionally notes that he may be moving to [place] later this fall but is not under percent sure yet.   Past Medical History:  Diagnosis Date  . 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 GAD (generalized anxiety disorder) 12/19/2015  . Hypertension   . Hypotestosteronism 06/13/2014  . Major depression 06/13/2014  . Morbid obesity ([place]) 06/13/2014  . Thyroid disease    No past surgical history on file. Social History  Substance Use Topics  . Smoking status: Never Smoker  . Smokeless tobacco: Never Used  . Alcohol use 0.0 oz/week   family history includes Schizophrenia in his maternal grandfather; Stroke in his other.  ROS as above:  Medications: Current Outpatient Prescriptions  Medication Sig Dispense Refill  . AMBULATORY NON FORMULARY MEDICATION Continuous positive airway pressure (CPAP) machine auto-titrate from 4-20 cm of H2O pressure, with all supplemental supplies as needed. 1 each 0  . B-D 3CC LUER-LOK SYR 25GX1" 25G X 1" 3 ML MISC USE AS DIRECTED 10 each 0  . B-D HYPODERMIC NEEDLE 18GX1.5" 18G X 1-1/2" MISC USE AS DIRECTED 10 each 0  . buPROPion (WELLBUTRIN XL) 150 MG 24 hr tablet Take 1 tablet (150 mg total) by mouth daily. 90 tablet 1  . buPROPion (WELLBUTRIN XL) 300 MG 24 hr tablet Take 1 tablet (300 mg total) by mouth daily. 90 tablet 1  . busPIRone (BUSPAR) 10 MG tablet Take 1 tablet (10 mg total) by mouth daily. 90 tablet 1  . diclofenac sodium  (VOLTAREN) 1 % GEL Apply 2 g topically 4 (four) times daily. To affected joint. (Patient not taking: Reported on 09/05/2016) 100 g 11  . esomeprazole (NEXIUM) 40 MG capsule Take 1 capsule (40 mg total) by mouth daily. 90 capsule 3  . fluticasone (FLONASE) 50 MCG/ACT nasal spray One spray in each nostril twice a day, use left hand for right nostril, and right hand for left nostril. 48 g 3  . levothyroxine (SYNTHROID) 200 MCG tablet Take 1 tablet (200 mcg total) by mouth daily before breakfast. Ok to switch brands due to recall 90 tablet 1  . losartan (COZAAR) 100 MG tablet Take 1 tablet (100 mg total) by mouth daily. 90 tablet 1  . naproxen (NAPROSYN) 500 MG tablet Take 1 tablet (500 mg total) by mouth 2 (two) times daily with a meal. 60 tablet 0  . sertraline (ZOLOFT) 100 MG tablet Take 2 tablets (200 mg total) by mouth daily. 180 tablet 1  . SYRINGE DISPOSABLE 3CC 3 ML MISC Use 1 syringe per week with testosterone injection. 25 each prn  . testosterone cypionate (DEPO-TESTOSTERONE) 200 MG/ML injection Inject 0.75 mLs (150 mg total) into the muscle every 7 (seven) days. 12 mL 1   No current facility-administered medications for this visit.    No Known Allergies  Health Maintenance Health Maintenance  Topic Date Due  . HIV Screening  07/19/1997  . INFLUENZA VACCINE  09/10/2016  . TETANUS/TDAP  03/29/2018     Exam:  BP 139/76   Pulse 75  Wt (!) 390 lb (176.9 kg)   BMI 48.75 kg/m  Gen: Well NAD  Right leg well-appearing wound with 4 horizontal mattress sutures. Sutures removed.   No results found for this or any previous visit (from the past 72 hour(s)). No results found.    Assessment and Plan: 36 y.o. male with  Right leg wound: Dermatofibroma sutures removed continue wound management.  Hypothyroid:   new prescription for levothyroxine authorizing pharmacy to switch brands. We'll plan to recheck labs in a few months.  No orders of the defined types were placed in this  encounter.  Meds ordered this encounter  Medications  . levothyroxine (SYNTHROID) 200 MCG tablet    Sig: Take 1 tablet (200 mcg total) by mouth daily before breakfast. Ok to switch brands due to recall    Dispense:  90 tablet    Refill:  1     Discussed warning signs or symptoms. Please see discharge instructions. Patient expresses understanding.

## 2016-10-07 ENCOUNTER — Encounter: Payer: Self-pay | Admitting: Family Medicine

## 2016-10-20 ENCOUNTER — Other Ambulatory Visit: Payer: Self-pay | Admitting: Family Medicine

## 2016-10-20 DIAGNOSIS — F411 Generalized anxiety disorder: Secondary | ICD-10-CM

## 2016-11-03 ENCOUNTER — Telehealth: Payer: Self-pay | Admitting: Family Medicine

## 2016-11-03 MED ORDER — AMBULATORY NON FORMULARY MEDICATION: 0 refills | Status: AC

## 2016-11-03 NOTE — Telephone Encounter (Signed)
CPAP download from the last month.   Set 4-20 auto titration.  Average usage 20/30 days  95%tile pressure is 10.8  Tyler Hancock finds the settings to be uncomfortable. He does not think he gets enough pressure when falling asleep.  Will change settings to constant pressure of 11cm H20 with a 15 min ramp

## 2016-12-15 ENCOUNTER — Ambulatory Visit (INDEPENDENT_AMBULATORY_CARE_PROVIDER_SITE_OTHER): Payer: 59 | Admitting: Family Medicine

## 2016-12-15 ENCOUNTER — Encounter: Payer: Self-pay | Admitting: Family Medicine

## 2016-12-15 VITALS — BP 131/84 | HR 86 | Temp 98.7°F | Wt 390.0 lb

## 2016-12-15 DIAGNOSIS — H938X1 Other specified disorders of right ear: Secondary | ICD-10-CM | POA: Diagnosis not present

## 2016-12-15 MED ORDER — CEFDINIR 300 MG PO CAPS: 300.0000 mg | ORAL_CAPSULE | Freq: Two times a day (BID) | ORAL | 0 refills | Status: DC

## 2016-12-15 MED ORDER — PREDNISONE 10 MG PO TABS: 30.0000 mg | ORAL_TABLET | Freq: Every day | ORAL | 0 refills | Status: DC

## 2016-12-15 NOTE — Progress Notes (Signed)
Tyler Hancock is a 36 y.o. male who presents to [place]: [place] today for 2 weeks duration congestion, post-nasal drop, ear fullness, and right sided facial pain. He states that his symptoms first began 2 weeks ago and have waxed and waned since that time. Also endorses headaches. Patient states that he also has had intermittent abdominal bloating with minimal diarrhea. His abdominal symptoms also come and go. Patient presents today due to concern of persistence of symptoms.  Patient denies fever or chills. He denies purulent nasal discharge. Denies cough, shortness of breath, or difficulty breathing.   Patient does have allergies and takes zyrtec daily. Has taken Flonase in the past with mixed results.    Past Medical History:  Diagnosis Date  . 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 GAD (generalized anxiety disorder) 12/19/2015  . Hypertension   . Hypotestosteronism 06/13/2014  . Major depression 06/13/2014  . Morbid obesity ([place]) 06/13/2014  . Thyroid disease    No past surgical history on file. Social History   Tobacco Use  . Smoking status: Never Smoker  . Smokeless tobacco: Never Used  Substance Use Topics  . Alcohol use: Yes    Alcohol/week: 0.0 oz   family history includes Schizophrenia in his maternal grandfather; Stroke in his other.  ROS as above:  Medications: Current Outpatient Medications  Medication Sig Dispense Refill  . AMBULATORY NON FORMULARY MEDICATION Change Continuous positive airway pressure (CPAP) machine setting to 11 cm of H2O pressure, with 15 min ramp. Send 30 day report after 30 days of use. 1 each 0  . B-D 3CC LUER-LOK SYR 25GX1" 25G X 1" 3 ML MISC USE AS DIRECTED 10 each 0  . B-D HYPODERMIC NEEDLE 18GX1.5" 18G X 1-1/2" MISC USE AS DIRECTED 10 each 0  . buPROPion (WELLBUTRIN XL) 150 MG 24 hr tablet TAKE 1 TABLET BY MOUTH ONCE DAILY 90  tablet 0  . buPROPion (WELLBUTRIN XL) 300 MG 24 hr tablet TAKE 1 TABLET BY MOUTH ONCE DAILY 90 tablet 0  . busPIRone (BUSPAR) 10 MG tablet TAKE 1 TABLET BY MOUTH ONCE DAILY 90 tablet 1  . diclofenac sodium (VOLTAREN) 1 % GEL Apply 2 g topically 4 (four) times daily. To affected joint. 100 g 11  . esomeprazole (NEXIUM) 40 MG capsule Take 1 capsule (40 mg total) by mouth daily. 90 capsule 3  . fluticasone (FLONASE) 50 MCG/ACT nasal spray One spray in each nostril twice a day, use left hand for right nostril, and right hand for left nostril. 48 g 3  . levothyroxine (SYNTHROID) 200 MCG tablet Take 1 tablet (200 mcg total) by mouth daily before breakfast. Ok to switch brands due to recall 90 tablet 1  . losartan (COZAAR) 100 MG tablet TAKE 1 TABLET BY MOUTH ONCE DAILY 90 tablet 0  . naproxen (NAPROSYN) 500 MG tablet Take 1 tablet (500 mg total) by mouth 2 (two) times daily with a meal. 60 tablet 0  . sertraline (ZOLOFT) 100 MG tablet TAKE 2 TABLETS BY MOUTH ONCE DAILY 180 tablet 0  . SYRINGE DISPOSABLE 3CC 3 ML MISC Use 1 syringe per week with testosterone injection. 25 each prn  . testosterone cypionate (DEPO-TESTOSTERONE) 200 MG/ML injection Inject 0.75 mLs (150 mg total) into the muscle every 7 (seven) days. 12 mL 1  . cefdinir (OMNICEF) 300 MG capsule Take 1 capsule (300 mg total) 2 (two) times daily by mouth. 14 capsule 0  . predniSONE (DELTASONE) 10 MG  tablet Take 3 tablets (30 mg total) daily with breakfast by mouth. 15 tablet 0   No current facility-administered medications for this visit.    No Known Allergies  Health Maintenance Health Maintenance  Topic Date Due  . HIV Screening  07/19/1997  . INFLUENZA VACCINE  09/10/2016  . TETANUS/TDAP  03/29/2018     Exam:  BP 131/84   Pulse 86   Temp 98.7 F (37.1 C) (Oral)   Wt (!) 390 lb (176.9 kg)   SpO2 97%   BMI 48.75 kg/m  Gen: Well NAD HEENT: EOMI,  MMM, R. Ear: minimal effusion, no erythemia. L. Ear: normal with no acute  findings. Mild cervical LAD BL.  Maxillary sinuses are nontender. Lungs: Normal work of breathing. CTABL. No crackles, no wheezes. Heart: RRR no MRG Abd: NABS, Soft. Nondistended, Nontender. Exts: Brisk capillary refill, warm and well perfused.    No results found for this or any previous visit (from the past 72 hour(s)). No results found.    Assessment and Plan: 36 y.o. male with who presents to [place]: [place] today for 2 weeks duration congestion, post-nasal drop, ear fullness, and right sided facial pain. Most concerning for viral URI process vs viral sinusitis. Does not currently have symptoms concerning for bacterial process. Minimal effusion behind right tympanic membrane does not appear infected and most likely secondary to eustachian tube swelling.  Instructed patient to continue taking zyrtec and flonase with nasal irrigation to control for allergic process. Patient was given two scripts to take on an as needed basis.  Antibiotics if process acutely worsens with fever, chills, purulent nasal discharge which would be consistent with antibiotic conversion of sinusitis or effusion. Also provided 5 day course of steroids to take on as needed basis if symptoms continue (not worsen) beyond 1 week.   Patient was instructed to call or return if process acutely worsens.  No orders of the defined types were placed in this encounter.  Meds ordered this encounter  Medications  . predniSONE (DELTASONE) 10 MG tablet    Sig: Take 3 tablets (30 mg total) daily with breakfast by mouth.    Dispense:  15 tablet    Refill:  0  . cefdinir (OMNICEF) 300 MG capsule    Sig: Take 1 capsule (300 mg total) 2 (two) times daily by mouth.    Dispense:  14 capsule    Refill:  0    Discussed warning signs or symptoms. Please see discharge instructions. Patient expresses understanding.

## 2016-12-15 NOTE — Patient Instructions (Signed)
Thank you for coming in today. Take prednisone daily if not better.  Take omnicef if worse.  Call or go to the emergency room if you get worse, have trouble breathing, have chest pains, or palpitations.    Barotitis Media Barotitis media is inflammation of the middle ear. This condition occurs when an auditory tube (eustachian tube) is blocked in one or both ears. These tubes lead from the middle ear to the back of the nose (nasopharynx). This condition typically occurs when you experience changes in pressure, such as when flying or scuba diving. Untreated barotitis media may lead to damage or hearing loss (barotrauma), which may become permanent. What are the causes? This condition may be caused by changes in air pressure from:  Flying.  Scuba diving.  A nearby explosion.  What increases the risk? The following factors may make you more likely to develop this condition:  Middle ear infection.  Sinus infection.  A cold.  Environmental allergies.  Small eustachian tubes.  Recent ear surgery.  What are the signs or symptoms? Symptoms of this condition may include:  Ear pain.  Hearing loss.  In severe cases, symptoms can include:  Dizziness and nausea (vertigo).  Temporary facial paralysis.  How is this diagnosed? This condition is diagnosed based on:  A physical exam. Your health care provider may: ? Use a device (otoscope) to look into your ear canal and check your eardrum. ? Do a test that changes air pressure in the middle ear to check how well the eardrum moves and to see if the eustachian tube is working(tympanogram).  Your medical history.  In some cases, your health care provider may have you take a hearing test. You may also be referred to someone who specializes in ear treatment (otolaryngologist, "ENT"). How is this treated? This condition may be treated with:  Medicines to relieve congestion in your nose, sinus, or upper respiratory tract  (decongestants).  Techniques to equalize pressure (to "pop" your ears), such as: ? Yawning. ? Chewing gum. ? Swallowing.  In severe cases, you may need surgery to relieve your symptoms or to prevent future inflammation. Follow these instructions at home:  Take over-the-counter and prescription medicines only as told by your health care provider.  Do not put anything into your ears to clean or unplug them. Ear drops will not help.  Keep all follow-up visits as told by your health care provider. This is important. How is this prevented? Using these strategies may help to prevent barotitis media:  Chewing gum with frequent, forceful swallowing during takeoff and landing when flying.  Holding your nose and gently blowing to pop your ears for equalizing pressure changes. This forces air into the eustachian tube.  Yawning during air pressure changes.  Using a nasal decongestant about 30-60 minutes before flying, if you have nasal congestion.  Contact a health care provider if:  You have vertigo.  You have hearing loss.  Your symptoms do not get better or they get worse.  You have a fever. Get help right away if:  You have a severe headache, ear pain, and dizziness.  You have balance problems.  You cannot move or feel part of your face.  You have bloody or pus-like drainage from your ears. Summary  Barotitis media is inflammation of the middle ear.  This condition typically occurs when you experience changes in pressure, such as when flying or scuba diving.  You may be at a higher risk for this condition if you have  small eustachian tubes, had recent ear surgery, or have allergies, a cold, or sinus or middle ear infection.  This condition may be treated with medicines or techniques to equalize pressure in your ears.  Strategies can be used to help prevent barotitis media. This information is not intended to replace advice given to you by your health care provider. Make  sure you discuss any questions you have with your health care provider. Document Released: 01/25/2000 Document Revised: 12/17/2015 Document Reviewed: 12/17/2015 Elsevier Interactive Patient Education  2017 [employer].

## 2016-12-16 ENCOUNTER — Ambulatory Visit: Payer: Self-pay | Admitting: Family Medicine

## 2016-12-27 ENCOUNTER — Encounter: Payer: Self-pay | Admitting: Family Medicine

## 2016-12-29 ENCOUNTER — Other Ambulatory Visit: Payer: Self-pay

## 2016-12-29 MED ORDER — "NEEDLE (DISP) 22G X 1-1/2"" MISC": 1 refills | Status: DC

## 2016-12-29 MED ORDER — "NEEDLE (DISP) 18G X 1-1/2"" MISC": 0 refills | Status: DC

## 2017-01-07 ENCOUNTER — Encounter: Payer: Self-pay | Admitting: Family Medicine

## 2017-01-07 MED ORDER — "NEEDLE (DISP) 22G X 1-1/2"" MISC": 99 refills | Status: DC

## 2017-01-07 MED ORDER — "NEEDLE (DISP) 18G X 1-1/2"" MISC": 99 refills | Status: DC

## 2017-01-14 ENCOUNTER — Ambulatory Visit (INDEPENDENT_AMBULATORY_CARE_PROVIDER_SITE_OTHER): Payer: 59 | Admitting: Family Medicine

## 2017-01-14 ENCOUNTER — Encounter: Payer: Self-pay | Admitting: Family Medicine

## 2017-01-14 VITALS — BP 146/93 | HR 83 | Ht 75.0 in | Wt 398.0 lb

## 2017-01-14 DIAGNOSIS — M25511 Pain in right shoulder: Secondary | ICD-10-CM | POA: Diagnosis not present

## 2017-01-14 NOTE — Patient Instructions (Signed)
Thank you for coming in today. Call or go to the ER if you develop a large red swollen joint with extreme pain or oozing puss.  Recheck as needed.  Continue the exercises.  Let me know if you dont do well.

## 2017-01-15 ENCOUNTER — Encounter: Payer: Self-pay | Admitting: Family Medicine

## 2017-01-15 DIAGNOSIS — M542 Cervicalgia: Secondary | ICD-10-CM

## 2017-01-15 NOTE — Progress Notes (Signed)
Tyler Hancock is a 36 y.o. male who presents to [place] today for right shoulder pain. Tyler Hancock has right shoulder pain previously evaluated and March of this year with an MRI arthrogram revealing supraspinatus and infraspinatus tendinopathy but no obvious labrum tear.  He did well following the injection is prior to the arthrogram that includes steroids.  He notes over the last month or so he has had worsening right upper arm pain.  The pain is worse with arm motion at work and at night.  He denies any radiating pain weakness or numbness.  He has not had much treatment recently.   Past Medical History:  Diagnosis Date  . 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 GAD (generalized anxiety disorder) 12/19/2015  . Hypertension   . Hypotestosteronism 06/13/2014  . Major depression 06/13/2014  . Morbid obesity ([place]) 06/13/2014  . Thyroid disease    No past surgical history on file. Social History   Tobacco Use  . Smoking status: Never Smoker  . Smokeless tobacco: Never Used  Substance Use Topics  . Alcohol use: Yes    Alcohol/week: 0.0 oz     ROS:  As above   Medications: Current Outpatient Medications  Medication Sig Dispense Refill  . AMBULATORY NON FORMULARY MEDICATION Change Continuous positive airway pressure (CPAP) machine setting to 11 cm of H2O pressure, with 15 min ramp. Send 30 day report after 30 days of use. 1 each 0  . B-D 3CC LUER-LOK SYR 25GX1" 25G X 1" 3 ML MISC USE AS DIRECTED 10 each 0  . buPROPion (WELLBUTRIN XL) 150 MG 24 hr tablet TAKE 1 TABLET BY MOUTH ONCE DAILY 90 tablet 0  . buPROPion (WELLBUTRIN XL) 300 MG 24 hr tablet TAKE 1 TABLET BY MOUTH ONCE DAILY 90 tablet 0  . busPIRone (BUSPAR) 10 MG tablet TAKE 1 TABLET BY MOUTH ONCE DAILY 90 tablet 1  . diclofenac sodium (VOLTAREN) 1 % GEL Apply 2 g topically 4 (four) times daily. To affected joint. 100 g 11  . esomeprazole (NEXIUM) 40 MG capsule Take 1 capsule (40 mg  total) by mouth daily. 90 capsule 3  . fluticasone (FLONASE) 50 MCG/ACT nasal spray One spray in each nostril twice a day, use left hand for right nostril, and right hand for left nostril. 48 g 3  . levothyroxine (SYNTHROID) 200 MCG tablet Take 1 tablet (200 mcg total) by mouth daily before breakfast. Ok to switch brands due to recall 90 tablet 1  . losartan (COZAAR) 100 MG tablet TAKE 1 TABLET BY MOUTH ONCE DAILY 90 tablet 0  . naproxen (NAPROSYN) 500 MG tablet Take 1 tablet (500 mg total) by mouth 2 (two) times daily with a meal. 60 tablet 0  . NEEDLE, DISP, 18 G (B-D HYPODERMIC NEEDLE 18GX1.5") 18G X 1-1/2" MISC USE AS DIRECTED to draw up testosterone 10 each prn  . NEEDLE, DISP, 22 G (BD DISP NEEDLES) 22G X 1-1/2" MISC USE AS DIRECTED to inject testosterone 50 each prn  . sertraline (ZOLOFT) 100 MG tablet TAKE 2 TABLETS BY MOUTH ONCE DAILY 180 tablet 0  . SYRINGE DISPOSABLE 3CC 3 ML MISC Use 1 syringe per week with testosterone injection. 25 each prn  . testosterone cypionate (DEPO-TESTOSTERONE) 200 MG/ML injection Inject 0.75 mLs (150 mg total) into the muscle every 7 (seven) days. 12 mL 1   No current facility-administered medications for this visit.    No Known Allergies   Exam:  BP (!) 146/93   Pulse  83   Ht 6\' 3"  (1.905 m)   Wt (!) 398 lb (180.5 kg)   BMI 49.75 kg/m  General: Well Developed, well nourished, and in no acute distress.  Neuro/Psych: Alert and oriented x3, extra-ocular muscles intact, able to move all 4 extremities, sensation grossly intact. Skin: Warm and dry, no rashes noted.  Respiratory: Not using accessory muscles, speaking in full sentences, trachea midline.  Cardiovascular: Pulses palpable, no extremity edema. Abdomen: Does not appear distended. MSK:  Right shoulder normal-appearing. Nontender. Normal motion. Mildly positive Hawkins and Neer's test. Negative empty can test Positive crossover arm compression test. Negative O'Brien's test. Negative  Yergason's and speeds test Strength is intact.  Procedure: Real-time Ultrasound Guided Injection of right subacromial injection  Device: GE Logiq E  Images permanently stored and available for review in the ultrasound unit. Verbal informed consent obtained. Discussed risks and benefits of procedure. Warned about infection bleeding damage to structures skin hypopigmentation and fat atrophy among others. Patient expresses understanding and agreement Time-out conducted.  Noted no overlying erythema, induration, or other signs of local infection.  Skin prepped in a sterile fashion.  Local anesthesia: Topical Ethyl chloride.  With sterile technique and under real time ultrasound guidance: 40mg  kenalog and 65ml marcaine injected easily.  Completed without difficulty  Pain partially resolved suggesting accurate placement of the medication.  Advised to call if fevers/chills, erythema, induration, drainage, or persistent bleeding.  Images permanently stored and available for review in the ultrasound unit.  Impression: Technically successful ultrasound guided injection.     No results found for this or any previous visit (from the past 48 hour(s)). No results found.    Assessment and Plan: 36 y.o. male with Right shoulder pain. Somewhat mysterious cause.  S/P injection today.  Plan to continue home exercises and recheck as needed.    No orders of the defined types were placed in this encounter.  No orders of the defined types were placed in this encounter.   Discussed warning signs or symptoms. Please see discharge instructions. Patient expresses understanding.  I spent 25 minutes with this patient, greater than 50% was face-to-face time counseling regarding ddx and treatment plan.

## 2017-01-16 ENCOUNTER — Ambulatory Visit (INDEPENDENT_AMBULATORY_CARE_PROVIDER_SITE_OTHER): Payer: 59 | Admitting: Sports Medicine

## 2017-01-16 ENCOUNTER — Encounter: Payer: Self-pay | Admitting: Sports Medicine

## 2017-01-16 DIAGNOSIS — M25511 Pain in right shoulder: Secondary | ICD-10-CM

## 2017-01-16 NOTE — Assessment & Plan Note (Signed)
Difficult to fully appreciate his right shoulder pain however I think it is coming from the glenohumeral joint.  I am only able to reproduce it with axial traction on the shoulder.  The rest of his specialized exams are negative. MRI showed cuff tendinopathy and an abnormal appearance of the labrum. Glenohumeral injection as above, he really does not have any impingement type pain so I think a subacromial injection done previously was effective. Return to see either myself or Dr. Georgina Snell in a month.

## 2017-01-16 NOTE — Progress Notes (Signed)
Subjective:    CC: Right shoulder pain  HPI: This is a pleasant 36 year old male, he has had some shoulder pain for some time now, he ended up with an MR arthrogram that showed an abnormal appearance of the labrum as well as supraspinatus and infraspinatus tendinopathy.  Subacromial injection improved all of his impingement type symptoms, his pain is more at the joint line and worse with letting his arm dangle, i.e. axial distraction of the joint.  No mechanical symptoms, no trauma.  Moderate, persistent.  Past medical history:  Negative.  See flowsheet/record as well for more information.  Surgical history: Negative.  See flowsheet/record as well for more information.  Family history: Negative.  See flowsheet/record as well for more information.  Social history: Negative.  See flowsheet/record as well for more information.  Allergies, and medications have been entered into the medical record, reviewed, and no changes needed.   (To billers/coders, pertinent past medical, social, surgical, family history can be found in problem list, if problem list is marked as reviewed then this indicates that past medical, social, surgical, family history was also reviewed)  Review of Systems: No fevers, chills, night sweats, weight loss, chest pain, or shortness of breath.   Objective:    General: Well Developed, well nourished, and in no acute distress.  Neuro: Alert and oriented x3, extra-ocular muscles intact, sensation grossly intact.  HEENT: Normocephalic, atraumatic, pupils equal round reactive to light, neck supple, no masses, no lymphadenopathy, thyroid nonpalpable.  Skin: Warm and dry, no rashes. Cardiac: Regular rate and rhythm, no murmurs rubs or gallops, no lower extremity edema.  Respiratory: Clear to auscultation bilaterally. Not using accessory muscles, speaking in full sentences. Right shoulder: Inspection reveals no abnormalities, atrophy or asymmetry. Palpation is normal with no  tenderness over AC joint or bicipital groove. ROM is full in all planes. Rotator cuff strength normal throughout. No signs of impingement with negative Neer and Hawkin's tests, empty can. Speeds and Yergason's tests normal. No labral pathology noted with negative Obrien's, negative crank, negative clunk, and good stability. Normal scapular function observed. No painful arc and no drop arm sign. No apprehension sign The only time unable to reproduce his pain is with axial, caudally directed traction on the upper extremity itself.  Procedure: Real-time Ultrasound Guided Injection of right glenohumeral joint Device: GE Logiq E  Verbal informed consent obtained.  Time-out conducted.  Noted no overlying erythema, induration, or other signs of local infection.  Skin prepped in a sterile fashion.  Local anesthesia: Topical Ethyl chloride.  With sterile technique and under real time ultrasound guidance: 1 cc kenalog 40, 2 cc lidocaine, 2 cc bupivacaine injected easily Completed without difficulty  Pain immediately resolved suggesting accurate placement of the medication.  Advised to call if fevers/chills, erythema, induration, drainage, or persistent bleeding.  Images permanently stored and available for review in the ultrasound unit.  Impression: Technically successful ultrasound guided injection.  Impression and Recommendations:    Pain in joint, shoulder region Difficult to fully appreciate his right shoulder pain however I think it is coming from the glenohumeral joint.  I am only able to reproduce it with axial traction on the shoulder.  The rest of his specialized exams are negative. MRI showed cuff tendinopathy and an abnormal appearance of the labrum. Glenohumeral injection as above, he really does not have any impingement type pain so I think a subacromial injection done previously was effective. Return to see either myself or Dr. Georgina Snell in a  month. ___________________________________________  Gwen Her. Dianah Field, M.D., ABFM., CAQSM. Primary Care and [place] Instructor of [place] [facility] [employer]

## 2017-01-20 ENCOUNTER — Ambulatory Visit (INDEPENDENT_AMBULATORY_CARE_PROVIDER_SITE_OTHER): Payer: 59 | Admitting: Family Medicine

## 2017-01-20 ENCOUNTER — Encounter: Payer: Self-pay | Admitting: Family Medicine

## 2017-01-20 ENCOUNTER — Ambulatory Visit (INDEPENDENT_AMBULATORY_CARE_PROVIDER_SITE_OTHER): Payer: 59

## 2017-01-20 VITALS — BP 147/79 | HR 85 | Wt 395.0 lb

## 2017-01-20 DIAGNOSIS — M5412 Radiculopathy, cervical region: Secondary | ICD-10-CM | POA: Diagnosis not present

## 2017-01-20 DIAGNOSIS — M25511 Pain in right shoulder: Secondary | ICD-10-CM | POA: Diagnosis not present

## 2017-01-20 DIAGNOSIS — M542 Cervicalgia: Secondary | ICD-10-CM

## 2017-01-20 MED ORDER — MELOXICAM 15 MG PO TABS: 15.0000 mg | ORAL_TABLET | Freq: Every day | ORAL | 0 refills | Status: DC

## 2017-01-20 NOTE — Progress Notes (Signed)
Tyler Hancock is a 36 y.o. male who presents to [place] today for right shoulder pain.  Ruffus notes continued pain in the right shoulder. He has been seen several times for this recently. The pain has been ongoing now for several months and is not better. He was seen on Dec 5th for this issue where he received a subacromial injection which did not help much. He was seen by my partner on Dec 7th when his pain worsened. During that visit he had a Glenohumeral injection which also didn't help much. Shaquelle does not that the pain is worse with using a computer mouse at work. He notes that sometimes he gets a tingling sensation to his dorsal hand and 1st two digits. He denies any weakness or numbness. He denies any frank neck pain or any neck injury.    Past Medical History:  Diagnosis Date  . 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 GAD (generalized anxiety disorder) 12/19/2015  . Hypertension   . Hypotestosteronism 06/13/2014  . Major depression 06/13/2014  . Morbid obesity ([place]) 06/13/2014  . Thyroid disease    No past surgical history on file. Social History   Tobacco Use  . Smoking status: Never Smoker  . Smokeless tobacco: Never Used  Substance Use Topics  . Alcohol use: Yes    Alcohol/week: 0.0 oz     ROS:  As above   Medications: Current Outpatient Medications  Medication Sig Dispense Refill  . AMBULATORY NON FORMULARY MEDICATION Change Continuous positive airway pressure (CPAP) machine setting to 11 cm of H2O pressure, with 15 min ramp. Send 30 day report after 30 days of use. 1 each 0  . B-D 3CC LUER-LOK SYR 25GX1" 25G X 1" 3 ML MISC USE AS DIRECTED 10 each 0  . buPROPion (WELLBUTRIN XL) 150 MG 24 hr tablet TAKE 1 TABLET BY MOUTH ONCE DAILY 90 tablet 0  . buPROPion (WELLBUTRIN XL) 300 MG 24 hr tablet TAKE 1 TABLET BY MOUTH ONCE DAILY 90 tablet 0  . busPIRone (BUSPAR) 10 MG tablet TAKE 1 TABLET BY MOUTH ONCE DAILY 90 tablet 1  .  diclofenac sodium (VOLTAREN) 1 % GEL Apply 2 g topically 4 (four) times daily. To affected joint. 100 g 11  . esomeprazole (NEXIUM) 40 MG capsule Take 1 capsule (40 mg total) by mouth daily. 90 capsule 3  . fluticasone (FLONASE) 50 MCG/ACT nasal spray One spray in each nostril twice a day, use left hand for right nostril, and right hand for left nostril. 48 g 3  . levothyroxine (SYNTHROID) 200 MCG tablet Take 1 tablet (200 mcg total) by mouth daily before breakfast. Ok to switch brands due to recall 90 tablet 1  . losartan (COZAAR) 100 MG tablet TAKE 1 TABLET BY MOUTH ONCE DAILY 90 tablet 0  . meloxicam (MOBIC) 15 MG tablet Take 1 tablet (15 mg total) by mouth daily. 30 tablet 0  . NEEDLE, DISP, 18 G (B-D HYPODERMIC NEEDLE 18GX1.5") 18G X 1-1/2" MISC USE AS DIRECTED to draw up testosterone 10 each prn  . NEEDLE, DISP, 22 G (BD DISP NEEDLES) 22G X 1-1/2" MISC USE AS DIRECTED to inject testosterone 50 each prn  . sertraline (ZOLOFT) 100 MG tablet TAKE 2 TABLETS BY MOUTH ONCE DAILY 180 tablet 0  . SYRINGE DISPOSABLE 3CC 3 ML MISC Use 1 syringe per week with testosterone injection. 25 each prn  . testosterone cypionate (DEPO-TESTOSTERONE) 200 MG/ML injection Inject 0.75 mLs (150 mg total) into the muscle  every 7 (seven) days. 12 mL 1   No current facility-administered medications for this visit.    No Known Allergies   Exam:  BP (!) 147/79   Pulse 85   Wt (!) 395 lb (179.2 kg)   BMI 49.37 kg/m  General: Well Developed, well nourished, and in no acute distress.  Neuro/Psych: Alert and oriented x3, extra-ocular muscles intact, able to move all 4 extremities, sensation grossly intact. Skin: Warm and dry, no rashes noted.  Respiratory: Not using accessory muscles, speaking in full sentences, trachea midline.  Cardiovascular: Pulses palpable, no extremity edema. Abdomen: Does not appear distended. MSK: C-spine: Non-tender to midline. Normal ROM.  Shoulder exam unchanged from my last note.    Non-tender.  Normal motion.  Negative impingement testing.  Normal strength BL UE    No results found for this or any previous visit (from the past 48 hour(s)). Dg Cervical Spine Complete  Result Date: 01/20/2017 CLINICAL DATA:  Right shoulder and neck pain, radicular symptoms EXAM: CERVICAL SPINE - COMPLETE 4+ VIEW COMPARISON:  None. FINDINGS: There is no evidence of cervical spine fracture or prevertebral soft tissue swelling. Alignment is normal. No other significant bone abnormalities are identified. IMPRESSION: Negative cervical spine radiographs. Electronically Signed   By: Jerilynn Mages.  Shick M.D.   On: 01/20/2017 12:09  Agree with read except I see loss of cervical lordosis indicating cervical spasm.     Assessment and Plan: 36 y.o. male with shoulder pain with unclear etiology.  Possibly cervical radicular to C4 or C5 nerve root.  Plan for PT, Cspine MRI. Recheck ina few weeks or after MRI.     Orders Placed This Encounter  Procedures  . MR Cervical Spine Wo Contrast    Standing Status:   Future    Standing Expiration Date:   03/23/2018    Order Specific Question:   What is the patient's sedation requirement?    Answer:   No Sedation    Order Specific Question:   Does the patient have a pacemaker or implanted devices?    Answer:   No    Order Specific Question:   Preferred imaging location?    Answer:   Product/process development scientist (table limit-350lbs)    Order Specific Question:   Radiology Contrast Protocol - do NOT remove file path    Answer:   [email or web address]    Order Specific Question:   Reason for Exam additional comments    Answer:   Question right C4 or C5 radiculopathy  . Ambulatory referral to Physical Therapy    Referral Priority:   Routine    Referral Type:   Physical Medicine    Referral Reason:   Specialty Services Required    Requested Specialty:   Physical Therapy    Number of Visits Requested:   1   No orders of the defined  types were placed in this encounter.   Discussed warning signs or symptoms. Please see discharge instructions. Patient expresses understanding.

## 2017-01-20 NOTE — Patient Instructions (Signed)
Thank you for coming in today.  Attend PT.  You should hear about MRI soon.  Let me know if not doing well or not getting better.

## 2017-01-21 NOTE — Telephone Encounter (Signed)
Is it time for a testosterone lab order?

## 2017-01-22 ENCOUNTER — Encounter: Payer: Self-pay | Admitting: Family Medicine

## 2017-01-22 MED ORDER — "NEEDLE (DISP) 18G X 1-1/2"" MISC": 99 refills | Status: DC

## 2017-01-22 MED ORDER — TESTOSTERONE CYPIONATE 200 MG/ML IM SOLN
150.0000 mg | INTRAMUSCULAR | 1 refills | Status: DC
Start: 2017-01-22 — End: 2017-01-26

## 2017-01-22 MED ORDER — SYRINGE DISPOSABLE 3 ML MISC: 99 refills | Status: AC

## 2017-01-22 MED ORDER — "NEEDLE (DISP) 22G X 1-1/2"" MISC": 99 refills | Status: DC

## 2017-01-23 MED ORDER — GABAPENTIN 300 MG PO CAPS: ORAL_CAPSULE | ORAL | 3 refills | Status: DC

## 2017-01-26 ENCOUNTER — Ambulatory Visit (INDEPENDENT_AMBULATORY_CARE_PROVIDER_SITE_OTHER): Payer: 59

## 2017-01-26 ENCOUNTER — Other Ambulatory Visit: Payer: Self-pay

## 2017-01-26 DIAGNOSIS — M50222 Other cervical disc displacement at C5-C6 level: Secondary | ICD-10-CM

## 2017-01-26 DIAGNOSIS — M25511 Pain in right shoulder: Secondary | ICD-10-CM

## 2017-01-26 MED ORDER — GABAPENTIN 300 MG PO CAPS: ORAL_CAPSULE | ORAL | 3 refills | Status: DC

## 2017-01-26 NOTE — Telephone Encounter (Signed)
Medication was sent to wrong pharmacy and could not be transferred. Verified that patient did not pick up Rx and he needs a new scripts sent to [facility]. Patient was able to get all other medication from [place]. Pharmacy info has been updated in the chart. Rhonda Cunningham,CMA

## 2017-01-27 ENCOUNTER — Telehealth: Payer: Self-pay | Admitting: Family Medicine

## 2017-01-27 DIAGNOSIS — M5412 Radiculopathy, cervical region: Secondary | ICD-10-CM

## 2017-01-27 NOTE — Telephone Encounter (Signed)
Order corrected

## 2017-01-27 NOTE — Telephone Encounter (Signed)
I discussed Cspine MRI results with [name]. Plan for epidural steroid injection ASAP.

## 2017-01-27 NOTE — Telephone Encounter (Signed)
Left message on the vm for GI [phone] advising to call the patient to schedule epidural injection. Rhonda Cunningham,CMA

## 2017-01-28 MED ORDER — TESTOSTERONE CYPIONATE 200 MG/ML IM SOLN: 150.0000 mg | INTRAMUSCULAR | 1 refills | Status: DC

## 2017-01-28 NOTE — Telephone Encounter (Signed)
Patient has been informed that medications has been sent to [facility]. Rhonda Cunningham,CMA

## 2017-02-09 ENCOUNTER — Ambulatory Visit
Admission: RE | Admit: 2017-02-09 | Discharge: 2017-02-09 | Disposition: A | Discharge status: Home / Self Care | Payer: 59 | Source: Ambulatory Visit | Attending: Family Medicine | Admitting: Family Medicine

## 2017-02-09 MED ORDER — IOPAMIDOL (ISOVUE-M 300) INJECTION 61%
15.0000 mL | Freq: Once | INTRAMUSCULAR | Status: AC | PRN
  Administered 2017-02-09: 15 mL via EPIDURAL

## 2017-02-09 MED ORDER — TRIAMCINOLONE ACETONIDE 40 MG/ML IJ SUSP (RADIOLOGY)
60.0000 mg | Freq: Once | INTRAMUSCULAR | Status: AC
  Administered 2017-02-09: 60 mg via EPIDURAL

## 2017-02-09 NOTE — Discharge Instructions (Signed)

## 2017-02-18 ENCOUNTER — Ambulatory Visit (INDEPENDENT_AMBULATORY_CARE_PROVIDER_SITE_OTHER): Payer: 59 | Admitting: Family Medicine

## 2017-02-18 ENCOUNTER — Encounter: Payer: Self-pay | Admitting: Family Medicine

## 2017-02-18 VITALS — BP 145/84 | HR 80 | Ht 75.0 in | Wt >= 6400 oz

## 2017-02-18 DIAGNOSIS — E890 Postprocedural hypothyroidism: Secondary | ICD-10-CM

## 2017-02-18 DIAGNOSIS — I10 Essential (primary) hypertension: Secondary | ICD-10-CM

## 2017-02-18 DIAGNOSIS — E349 Endocrine disorder, unspecified: Secondary | ICD-10-CM

## 2017-02-18 NOTE — Patient Instructions (Addendum)
Thank you for coming in today. Get fasting labs in the near future.  Recheck in 6 months if doing well.  Return sooner if needed.   Let me know if you need a repeat neck injection.

## 2017-02-18 NOTE — Progress Notes (Signed)
Tyler Hancock is a 37 y.o. male who presents to [place]: [place] today for follow-up right shoulder pain.  Tyler Hancock has been seen several times for right shoulder and arm pain eventually thought to be due to cervical radiculopathy.  He recently had an epidural steroid injection and notes that he is completely pain-free and has been able to stop his gabapentin.  He is delighted with how well he is doing.   Past Medical History:  Diagnosis Date  . 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 GAD (generalized anxiety disorder) 12/19/2015  . Hypertension   . Hypotestosteronism 06/13/2014  . Major depression 06/13/2014  . Morbid obesity ([place]) 06/13/2014  . Thyroid disease    No past surgical history on file. Social History   Tobacco Use  . Smoking status: Never Smoker  . Smokeless tobacco: Never Used  Substance Use Topics  . Alcohol use: Yes    Alcohol/week: 0.0 oz   family history includes Schizophrenia in his maternal grandfather; Stroke in his other.  ROS as above:  Medications: Current Outpatient Medications  Medication Sig Dispense Refill  . AMBULATORY NON FORMULARY MEDICATION Change Continuous positive airway pressure (CPAP) machine setting to 11 cm of H2O pressure, with 15 min ramp. Send 30 day report after 30 days of use. 1 each 0  . B-D 3CC LUER-LOK SYR 25GX1" 25G X 1" 3 ML MISC USE AS DIRECTED 10 each 0  . buPROPion (WELLBUTRIN XL) 150 MG 24 hr tablet TAKE 1 TABLET BY MOUTH ONCE DAILY 90 tablet 0  . buPROPion (WELLBUTRIN XL) 300 MG 24 hr tablet TAKE 1 TABLET BY MOUTH ONCE DAILY 90 tablet 0  . busPIRone (BUSPAR) 10 MG tablet TAKE 1 TABLET BY MOUTH ONCE DAILY 90 tablet 1  . diclofenac sodium (VOLTAREN) 1 % GEL Apply 2 g topically 4 (four) times daily. To affected joint. 100 g 11  . esomeprazole (NEXIUM) 40 MG capsule Take 1 capsule (40 mg total) by mouth daily. 90 capsule  3  . gabapentin (NEURONTIN) 300 MG capsule One tab PO qHS for a week, then BID for a week, then TID. May double weekly to a max of 3,600mg /day 180 capsule 3  . levothyroxine (SYNTHROID) 200 MCG tablet Take 1 tablet (200 mcg total) by mouth daily before breakfast. Ok to switch brands due to recall 90 tablet 1  . losartan (COZAAR) 100 MG tablet TAKE 1 TABLET BY MOUTH ONCE DAILY 90 tablet 0  . meloxicam (MOBIC) 15 MG tablet Take 1 tablet (15 mg total) by mouth daily. 30 tablet 0  . NEEDLE, DISP, 18 G (B-D HYPODERMIC NEEDLE 18GX1.5") 18G X 1-1/2" MISC USE AS DIRECTED to draw up testosterone 10 each prn  . NEEDLE, DISP, 22 G (BD DISP NEEDLES) 22G X 1-1/2" MISC USE AS DIRECTED to inject testosterone 50 each prn  . sertraline (ZOLOFT) 100 MG tablet TAKE 2 TABLETS BY MOUTH ONCE DAILY 180 tablet 0  . SYRINGE DISPOSABLE 3CC 3 ML MISC Use 1 syringe per week with testosterone injection. 25 each prn  . testosterone cypionate (DEPO-TESTOSTERONE) 200 MG/ML injection Inject 0.75 mLs (150 mg total) into the muscle every 7 (seven) days. 12 mL 1   No current facility-administered medications for this visit.    No Known Allergies  Health Maintenance Health Maintenance  Topic Date Due  . HIV Screening  07/19/1997  . TETANUS/TDAP  03/29/2018  . INFLUENZA VACCINE  Completed     Exam:  BP Marland Kitchen)  145/84   Pulse 80   Ht 6\' 3"  (1.905 m)   Wt (!) 401 lb (181.9 kg)   BMI 50.12 kg/m  Gen: Well NAD HEENT: EOMI,  MMM Lungs: Normal work of breathing. CTABL Heart: RRR no MRG Abd: NABS, Soft. Nondistended, Nontender Exts: Brisk capillary refill, warm and well perfused.  Right shoulder normal motion nontender.   No results found for this or any previous visit (from the past 72 hour(s)). No results found.    Assessment and Plan: 37 y.o. male with right cervical radiculopathy.  Much improved following epidural steroid injection.  Use gabapentin only as needed; we will repeat injections as needed.  Patient is  due for routine labs for hypo-testosterone is him obesity and hypertension.  Labs ordered today listed below should be done fasting midcycle testosterone injections in about a month.   Orders Placed This Encounter  Procedures  . CBC  . COMPLETE METABOLIC PANEL WITH GFR  . Lipid Panel w/reflex Direct LDL  . PSA  . TSH  . Testosterone  . Hemoglobin A1c   No orders of the defined types were placed in this encounter.    Discussed warning signs or symptoms. Please see discharge instructions. Patient expresses understanding.

## 2017-02-19 ENCOUNTER — Other Ambulatory Visit: Payer: Self-pay | Admitting: Family Medicine

## 2017-02-19 DIAGNOSIS — F411 Generalized anxiety disorder: Secondary | ICD-10-CM

## 2017-02-27 ENCOUNTER — Encounter: Payer: Self-pay | Admitting: Family Medicine

## 2017-02-27 ENCOUNTER — Other Ambulatory Visit: Payer: Self-pay | Admitting: Family Medicine

## 2017-02-27 DIAGNOSIS — F411 Generalized anxiety disorder: Secondary | ICD-10-CM

## 2017-03-02 MED ORDER — LOSARTAN POTASSIUM 100 MG PO TABS: 100.0000 mg | ORAL_TABLET | Freq: Every day | ORAL | 0 refills | Status: DC

## 2017-03-02 MED ORDER — BUPROPION HCL ER (XL) 300 MG PO TB24: 300.0000 mg | ORAL_TABLET | Freq: Every day | ORAL | 0 refills | Status: DC

## 2017-03-02 MED ORDER — BUSPIRONE HCL 10 MG PO TABS: 10.0000 mg | ORAL_TABLET | Freq: Every day | ORAL | 1 refills | Status: DC

## 2017-03-02 MED ORDER — SERTRALINE HCL 100 MG PO TABS: 200.0000 mg | ORAL_TABLET | Freq: Every day | ORAL | 0 refills | Status: DC

## 2017-03-02 MED ORDER — BUPROPION HCL ER (XL) 150 MG PO TB24: 150.0000 mg | ORAL_TABLET | Freq: Every day | ORAL | 0 refills | Status: DC

## 2017-04-16 ENCOUNTER — Other Ambulatory Visit: Payer: Self-pay | Admitting: Family Medicine

## 2017-04-16 ENCOUNTER — Encounter: Payer: Self-pay | Admitting: Family Medicine

## 2017-04-16 LAB — HEMOGLOBIN A1C
EAG (MMOL/L): 5.2 (calc)
Hgb A1c MFr Bld: 4.9 % of total Hgb (ref <5.7)
Mean Plasma Glucose: 94 (calc)

## 2017-04-16 LAB — COMPLETE METABOLIC PANEL WITH GFR
AG Ratio: 1.7 (calc) (ref 1.0–2.5)
ALT: 33 U/L (ref 9–46)
AST: 21 U/L (ref 10–40)
Albumin: 4.5 g/dL (ref 3.6–5.1)
Alkaline phosphatase (APISO): 51 U/L (ref 40–115)
BUN: 14 mg/dL (ref 7–25)
CALCIUM: 9.3 mg/dL (ref 8.6–10.3)
CO2: 29 mmol/L (ref 20–32)
CREATININE: 0.97 mg/dL (ref 0.60–1.35)
Chloride: 103 mmol/L (ref 98–110)
GFR, EST AFRICAN AMERICAN: 118 mL/min/{1.73_m2} (ref >=60)
GFR, EST NON AFRICAN AMERICAN: 101 mL/min/{1.73_m2} (ref >=60)
GLUCOSE: 85 mg/dL (ref 65–99)
Globulin: 2.7 g/dL (calc) (ref 1.9–3.7)
Potassium: 4.1 mmol/L (ref 3.5–5.3)
Sodium: 140 mmol/L (ref 135–146)
TOTAL PROTEIN: 7.2 g/dL (ref 6.1–8.1)
Total Bilirubin: 1 mg/dL (ref 0.2–1.2)

## 2017-04-16 LAB — LIPID PANEL W/REFLEX DIRECT LDL
Cholesterol: 168 mg/dL (ref <200)
HDL: 49 mg/dL (ref >40)
LDL CHOLESTEROL (CALC): 102 mg/dL — AB
NON-HDL CHOLESTEROL (CALC): 119 mg/dL (ref <130)
Total CHOL/HDL Ratio: 3.4 (calc) (ref <5.0)
Triglycerides: 78 mg/dL (ref <150)

## 2017-04-16 LAB — CBC
HCT: 47.9 % (ref 38.5–50.0)
Hemoglobin: 16.7 g/dL (ref 13.2–17.1)
MCH: 31.5 pg (ref 27.0–33.0)
MCHC: 34.9 g/dL (ref 32.0–36.0)
MCV: 90.4 fL (ref 80.0–100.0)
MPV: 11.7 fL (ref 7.5–12.5)
PLATELETS: 182 10*3/uL (ref 140–400)
RBC: 5.3 10*6/uL (ref 4.20–5.80)
RDW: 12.8 % (ref 11.0–15.0)
WBC: 6.9 10*3/uL (ref 3.8–10.8)

## 2017-04-16 LAB — TSH: TSH: 1.35 mIU/L (ref 0.40–4.50)

## 2017-04-16 LAB — PSA: PSA: 0.5 ng/mL (ref <=4.0)

## 2017-04-16 LAB — TESTOSTERONE: Testosterone: 147 ng/dL — ABNORMAL LOW (ref 250–827)

## 2017-04-16 MED ORDER — LEVOTHYROXINE SODIUM 200 MCG PO TABS: 200.0000 ug | ORAL_TABLET | Freq: Every day | ORAL | 1 refills | Status: DC

## 2017-04-17 ENCOUNTER — Ambulatory Visit: Payer: Self-pay | Admitting: Family Medicine

## 2017-04-17 ENCOUNTER — Ambulatory Visit (INDEPENDENT_AMBULATORY_CARE_PROVIDER_SITE_OTHER): Payer: 59 | Admitting: Family Medicine

## 2017-04-17 ENCOUNTER — Encounter: Payer: Self-pay | Admitting: Family Medicine

## 2017-04-17 VITALS — BP 136/83 | HR 81 | Ht 75.0 in | Wt >= 6400 oz

## 2017-04-17 DIAGNOSIS — F411 Generalized anxiety disorder: Secondary | ICD-10-CM | POA: Diagnosis not present

## 2017-04-17 DIAGNOSIS — E890 Postprocedural hypothyroidism: Secondary | ICD-10-CM

## 2017-04-17 DIAGNOSIS — R5383 Other fatigue: Secondary | ICD-10-CM | POA: Diagnosis not present

## 2017-04-17 DIAGNOSIS — G4733 Obstructive sleep apnea (adult) (pediatric): Secondary | ICD-10-CM

## 2017-04-17 DIAGNOSIS — I10 Essential (primary) hypertension: Secondary | ICD-10-CM | POA: Diagnosis not present

## 2017-04-17 DIAGNOSIS — E349 Endocrine disorder, unspecified: Secondary | ICD-10-CM | POA: Diagnosis not present

## 2017-04-17 DIAGNOSIS — F330 Major depressive disorder, recurrent, mild: Secondary | ICD-10-CM | POA: Diagnosis not present

## 2017-04-17 NOTE — Progress Notes (Signed)
Tyler Hancock is a 37 y.o. male who presents to [place]: [place] today for follow up testosterone, Thyroid, mood, HTN.   Tyler Hancock has a history of hypogonadism and currently takes testosterone 150 mg IM every week.  He notes he feels pretty well with this but notes his fatigue is worsened slightly.  He does not note any change in libido.  He notes he has been working a lot more over time recently and getting less good quality sleep and thinks is probably relating to his fatigue.   Thyroid: Tyler Hancock is a history of hypothyroidism and currently takes levothyroxine listed below.  Aside from fatigue listed above he denies feeling too hot or too cold feels pretty well otherwise.  Mood: Tyler Hancock has a history of anxiety and depression.  This is typically managed with Zoloft and Wellbutrin.  He notes because he has been working a lot more than usual he has become slightly more irritable.  He also notes she is not sleeping as well as usual.  Sleep apnea: Tyler Hancock has a history of obstructive sleep apnea.  He recently got over viral URI was not using his CPAP during his illness.  He thinks this may also cause some of his fatigue.  Typically uses it reliably and feels well rested.  Hypertension: He has a history of hypertension managed with losartan.  He takes medications regularly and denies chest pain palpitations or shortness of breath.   Past Medical History:  Diagnosis Date  . 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 GAD (generalized anxiety disorder) 12/19/2015  . Hypertension   . Hypotestosteronism 06/13/2014  . Major depression 06/13/2014  . Morbid obesity ([place]) 06/13/2014  . Thyroid disease    No past surgical history on file. Social History   Tobacco Use  . Smoking status: Never Smoker  . Smokeless tobacco: Never Used  Substance Use Topics  . Alcohol use: Yes    Alcohol/week: 0.0 oz    family history includes Schizophrenia in his maternal grandfather; Stroke in his other.  ROS as above:  Medications: Current Outpatient Medications  Medication Sig Dispense Refill  . AMBULATORY NON FORMULARY MEDICATION Change Continuous positive airway pressure (CPAP) machine setting to 11 cm of H2O pressure, with 15 min ramp. Send 30 day report after 30 days of use. 1 each 0  . buPROPion (WELLBUTRIN XL) 150 MG 24 hr tablet Take 1 tablet (150 mg total) by mouth daily. 90 tablet 0  . buPROPion (WELLBUTRIN XL) 300 MG 24 hr tablet Take 1 tablet (300 mg total) by mouth daily. 90 tablet 0  . busPIRone (BUSPAR) 10 MG tablet Take 1 tablet (10 mg total) by mouth daily. 90 tablet 1  . diclofenac sodium (VOLTAREN) 1 % GEL Apply 2 g topically 4 (four) times daily. To affected joint. 100 g 11  . esomeprazole (NEXIUM) 40 MG capsule Take 1 capsule (40 mg total) by mouth daily. 90 capsule 3  . gabapentin (NEURONTIN) 300 MG capsule One tab PO qHS for a week, then BID for a week, then TID. May double weekly to a max of 3,600mg /day 180 capsule 3  . levothyroxine (SYNTHROID) 200 MCG tablet Take 1 tablet (200 mcg total) by mouth daily before breakfast. 90 tablet 1  . losartan (COZAAR) 100 MG tablet Take 1 tablet (100 mg total) by mouth daily. 90 tablet 0  . meloxicam (MOBIC) 15 MG tablet Take 1 tablet (15 mg total) by mouth daily. 30 tablet 0  .  NEEDLE, DISP, 18 G (B-D HYPODERMIC NEEDLE 18GX1.5") 18G X 1-1/2" MISC USE AS DIRECTED to draw up testosterone 10 each prn  . NEEDLE, DISP, 22 G (BD DISP NEEDLES) 22G X 1-1/2" MISC USE AS DIRECTED to inject testosterone 50 each prn  . sertraline (ZOLOFT) 100 MG tablet Take 2 tablets (200 mg total) by mouth daily. 180 tablet 0  . SYRINGE DISPOSABLE 3CC 3 ML MISC Use 1 syringe per week with testosterone injection. 25 each prn  . testosterone cypionate (DEPO-TESTOSTERONE) 200 MG/ML injection Inject 0.75 mLs (150 mg total) into the muscle every 7 (seven) days. 12 mL 1   No  current facility-administered medications for this visit.    No Known Allergies  Health Maintenance Health Maintenance  Topic Date Due  . HIV Screening  07/19/1997  . TETANUS/TDAP  03/29/2018  . INFLUENZA VACCINE  Completed     Exam:  BP 136/83   Pulse 81   Ht 6\' 3"  (1.905 m)   Wt (!) 403 lb (182.8 kg)   BMI 50.37 kg/m  Gen: Well NAD obese HEENT: EOMI,  MMM Lungs: Normal work of breathing. CTABL Heart: RRR no MRG Abd: NABS, Soft. Nondistended, Nontender Exts: Brisk capillary refill, warm and well perfused. Height: Alert and oriented normal speech thought process and affect.  Depression screen [facility] 2/9 04/17/2017 01/15/2016 12/19/2015 08/22/2015  Decreased Interest 1 2 2 2   Down, Depressed, Hopeless 1 2 2 3   PHQ - 2 Score 2 4 4 5   Altered sleeping 0 2 2 1   Tired, decreased energy 2 2 2 3   Change in appetite 1 3 2 2   Feeling bad or failure about yourself  1 - 1 2  Trouble concentrating 1 1 3  0  Moving slowly or fidgety/restless 0 2 3 1   Suicidal thoughts 0 0 0 0  PHQ-9 Score 7 14 17 14   Difficult doing work/chores Somewhat difficult - Extremely dIfficult Very difficult  Some encounter information is confidential and restricted. Go to Review Flowsheets activity to see all data.     GAD 7 : Generalized Anxiety Score 04/17/2017 01/15/2016 12/19/2015 10/02/2015  Nervous, Anxious, on Edge 1 2 2 1   Control/stop worrying 1 2 2 1   Worry too much - different things 1 2 2 1   Trouble relaxing 0 1 1 1   Restless 0 1 2 0  Easily annoyed or irritable 2 1 2  0  Afraid - awful might happen 1 2 2  0  Total GAD 7 Score 6 11 13 4   Anxiety Difficulty Somewhat difficult Very difficult Extremely difficult Not difficult at all  Some encounter information is confidential and restricted. Go to Review Flowsheets activity to see all data.        Results for orders placed or performed in visit on 02/18/17 (from the past 72 hour(s))  CBC     Status: None   Collection Time: 04/15/17  8:01 AM    Result Value Ref Range   WBC 6.9 3.8 - 10.8 Thousand/uL   RBC 5.30 4.20 - 5.80 Million/uL   Hemoglobin 16.7 13.2 - 17.1 g/dL   HCT 47.9 38.5 - 50.0 %   MCV 90.4 80.0 - 100.0 fL   MCH 31.5 27.0 - 33.0 pg   MCHC 34.9 32.0 - 36.0 g/dL   RDW 12.8 11.0 - 15.0 %   Platelets 182 140 - 400 Thousand/uL   MPV 11.7 7.5 - 12.5 fL  COMPLETE METABOLIC PANEL WITH GFR     Status: None   Collection  Time: 04/15/17  8:01 AM  Result Value Ref Range   Glucose, Bld 85 65 - 99 mg/dL    Comment: .            Fasting reference interval .    BUN 14 7 - 25 mg/dL   Creat 0.97 0.60 - 1.35 mg/dL   GFR, Est Non African American 101 > OR = 60 mL/min/1.50m2   GFR, Est African American 118 > OR = 60 mL/min/1.65m2   BUN/Creatinine Ratio NOT APPLICABLE 6 - 22 (calc)   Sodium 140 135 - 146 mmol/L   Potassium 4.1 3.5 - 5.3 mmol/L   Chloride 103 98 - 110 mmol/L   CO2 29 20 - 32 mmol/L   Calcium 9.3 8.6 - 10.3 mg/dL   Total Protein 7.2 6.1 - 8.1 g/dL   Albumin 4.5 3.6 - 5.1 g/dL   Globulin 2.7 1.9 - 3.7 g/dL (calc)   AG Ratio 1.7 1.0 - 2.5 (calc)   Total Bilirubin 1.0 0.2 - 1.2 mg/dL   Alkaline phosphatase (APISO) 51 40 - 115 U/L   AST 21 10 - 40 U/L   ALT 33 9 - 46 U/L  Lipid Panel w/reflex Direct LDL     Status: Abnormal   Collection Time: 04/15/17  8:01 AM  Result Value Ref Range   Cholesterol 168 <200 mg/dL   HDL 49 >40 mg/dL   Triglycerides 78 <150 mg/dL   LDL Cholesterol (Calc) 102 (H) mg/dL (calc)    Comment: Reference range: <100 . Desirable range <100 mg/dL for primary prevention;   <70 mg/dL for patients with CHD or diabetic patients  with > or = 2 CHD risk factors. Marland Kitchen LDL-C is now calculated using the Martin-Hopkins  calculation, which is a validated novel method providing  better accuracy than the Friedewald equation in the  estimation of LDL-C.  Cresenciano Genre et al. Annamaria Helling. 1610;960(45): 2061-2068  (http://education.[facility].com/faq/FAQ164)    Total CHOL/HDL Ratio 3.4 <5.0 (calc)    Non-HDL Cholesterol (Calc) 119 <130 mg/dL (calc)    Comment: For patients with diabetes plus 1 major ASCVD risk  factor, treating to a non-HDL-C goal of <100 mg/dL  (LDL-C of <70 mg/dL) is considered a therapeutic  option.   PSA     Status: None   Collection Time: 04/15/17  8:01 AM  Result Value Ref Range   PSA 0.5 < OR = 4.0 ng/mL    Comment: The total PSA value from this assay system is  standardized against the WHO standard. The test  result will be approximately 20% lower when compared  to the equimolar-standardized total PSA (Beckman  Coulter). Comparison of serial PSA results should be  interpreted with this fact in mind. . This test was performed using the Siemens  chemiluminescent method. Values obtained from  different assay methods cannot be used interchangeably. PSA levels, regardless of value, should not be interpreted as absolute evidence of the presence or absence of disease.   TSH     Status: None   Collection Time: 04/15/17  8:01 AM  Result Value Ref Range   TSH 1.35 0.40 - 4.50 mIU/L  Testosterone     Status: Abnormal   Collection Time: 04/15/17  8:01 AM  Result Value Ref Range   Testosterone 147 (L) 250 - 827 ng/dL    Comment: In hypogonadal males, Testosterone, Total, LC/MS/MS, is the recommended assay due to the diminished accuracy of immunoassay at levels below 250 ng/dL. This test code [id number]) must be collected in  a red-top tube with no gel.    Hemoglobin A1c     Status: None   Collection Time: 04/15/17  8:01 AM  Result Value Ref Range   Hgb A1c MFr Bld 4.9 <5.7 % of total Hgb    Comment: For the purpose of screening for the presence of diabetes: . <5.7%       Consistent with the absence of diabetes 5.7-6.4%    Consistent with increased risk for diabetes             (prediabetes) > or =6.5%  Consistent with diabetes . This assay result is consistent with a decreased risk of diabetes. . Currently, no consensus exists regarding use  of hemoglobin A1c for diagnosis of diabetes in children. . According to American Diabetes Association (ADA) guidelines, hemoglobin A1c <7.0% represents optimal control in non-pregnant diabetic patients. Different metrics may apply to specific patient populations.  Standards of Medical Care in Diabetes(ADA). .    Mean Plasma Glucose 94 (calc)   eAG (mmol/L) 5.2 (calc)   No results found.    Assessment and Plan: 37 y.o. male with   Testosterone: Levels low at recheck unclear why.  Patient remains about the same clinically.  Continue current regimen and recheck in 6 months or sooner if needed.   Thyroid: TSH normal.  Continue current dose of levothyroxine.  Recheck every 6 months.  Mood: Slightly worse likely related to situational and poor quality sleep.  Work on lifestyle modification and resume CPAP for sleep apnea.  We will keep a watchful eye on this and address it if it worsens.    Sleep apnea: Resume CPAP as above   Hypertension: Continue losartan.  Continue home blood pressure log if remains elevated will adjust dose as needed.     No orders of the defined types were placed in this encounter.  No orders of the defined types were placed in this encounter.    Discussed warning signs or symptoms. Please see discharge instructions. Patient expresses understanding.

## 2017-04-17 NOTE — Patient Instructions (Addendum)
Thank you for coming in today. Recheck every 6 months or so.  Work on sleep and using CPAP at night,  Keep track of mood and blood pressure.  Let me know if we need to change anything.

## 2017-05-05 ENCOUNTER — Encounter: Payer: Self-pay | Admitting: Family Medicine

## 2017-05-05 DIAGNOSIS — F411 Generalized anxiety disorder: Secondary | ICD-10-CM

## 2017-05-05 MED ORDER — BUPROPION HCL ER (XL) 300 MG PO TB24: 300.0000 mg | ORAL_TABLET | Freq: Every day | ORAL | 1 refills | Status: DC

## 2017-05-05 MED ORDER — BUPROPION HCL ER (XL) 150 MG PO TB24: 150.0000 mg | ORAL_TABLET | Freq: Every day | ORAL | 1 refills | Status: DC

## 2017-05-05 MED ORDER — ESOMEPRAZOLE MAGNESIUM 40 MG PO CPDR: 40.0000 mg | DELAYED_RELEASE_CAPSULE | Freq: Every day | ORAL | 3 refills | Status: DC

## 2017-05-05 MED ORDER — LOSARTAN POTASSIUM 100 MG PO TABS: 100.0000 mg | ORAL_TABLET | Freq: Every day | ORAL | 1 refills | Status: DC

## 2017-05-05 MED ORDER — LEVOTHYROXINE SODIUM 200 MCG PO TABS: 200.0000 ug | ORAL_TABLET | Freq: Every day | ORAL | 1 refills | Status: DC

## 2017-05-05 MED ORDER — SERTRALINE HCL 100 MG PO TABS: 200.0000 mg | ORAL_TABLET | Freq: Every day | ORAL | 1 refills | Status: DC

## 2017-05-06 MED ORDER — TESTOSTERONE CYPIONATE 200 MG/ML IM SOLN: 150.0000 mg | INTRAMUSCULAR | 1 refills | Status: DC

## 2017-05-06 MED ORDER — PANTOPRAZOLE SODIUM 40 MG PO TBEC: 40.0000 mg | DELAYED_RELEASE_TABLET | Freq: Every day | ORAL | 3 refills | Status: DC

## 2017-05-06 NOTE — Addendum Note (Signed)
Addended by: Gregor Hams on: 05/06/2017 05:38 AM   Modules accepted: Orders

## 2017-05-06 NOTE — Addendum Note (Signed)
Addended by: Gregor Hams on: 05/06/2017 05:40 AM   Modules accepted: Orders

## 2017-05-07 MED ORDER — LEVOTHYROXINE SODIUM 200 MCG PO TABS: 200.0000 ug | ORAL_TABLET | Freq: Every day | ORAL | 1 refills | Status: DC

## 2017-05-07 MED ORDER — BUPROPION HCL ER (XL) 150 MG PO TB24: 450.0000 mg | ORAL_TABLET | Freq: Every day | ORAL | 1 refills | Status: DC

## 2017-05-07 MED ORDER — BUSPIRONE HCL 5 MG PO TABS: 5.0000 mg | ORAL_TABLET | Freq: Three times a day (TID) | ORAL | 1 refills | Status: DC | PRN

## 2017-05-07 MED ORDER — OMEPRAZOLE 40 MG PO CPDR: 40.0000 mg | DELAYED_RELEASE_CAPSULE | Freq: Every day | ORAL | 3 refills | Status: DC

## 2017-05-07 MED ORDER — SERTRALINE HCL 100 MG PO TABS: 200.0000 mg | ORAL_TABLET | Freq: Every day | ORAL | 1 refills | Status: DC

## 2017-05-07 NOTE — Addendum Note (Signed)
Addended by: Gregor Hams on: 05/07/2017 07:39 AM   Modules accepted: Orders

## 2017-05-27 ENCOUNTER — Other Ambulatory Visit: Payer: Self-pay

## 2017-05-27 MED ORDER — "NEEDLE (DISP) 18G X 1-1/2"" MISC": 99 refills | Status: DC

## 2017-06-09 ENCOUNTER — Other Ambulatory Visit: Payer: Self-pay | Admitting: Family Medicine

## 2017-07-03 ENCOUNTER — Encounter: Payer: Self-pay | Admitting: Family Medicine

## 2017-07-03 ENCOUNTER — Ambulatory Visit (INDEPENDENT_AMBULATORY_CARE_PROVIDER_SITE_OTHER): Payer: 59 | Admitting: Family Medicine

## 2017-07-03 VITALS — BP 144/85 | HR 72 | Ht 75.0 in | Wt >= 6400 oz

## 2017-07-03 DIAGNOSIS — R42 Dizziness and giddiness: Secondary | ICD-10-CM

## 2017-07-03 DIAGNOSIS — F411 Generalized anxiety disorder: Secondary | ICD-10-CM

## 2017-07-03 DIAGNOSIS — R5383 Other fatigue: Secondary | ICD-10-CM | POA: Diagnosis not present

## 2017-07-03 DIAGNOSIS — R61 Generalized hyperhidrosis: Secondary | ICD-10-CM | POA: Diagnosis not present

## 2017-07-03 MED ORDER — BUSPIRONE HCL 10 MG PO TABS: 10.0000 mg | ORAL_TABLET | Freq: Every day | ORAL | 3 refills | Status: DC | PRN

## 2017-07-03 NOTE — Patient Instructions (Signed)
Thank you for coming in today. Get labs now.  Track blood sugar.  Recheck in 1 month.  If this keep happening let me know.   Hyper Hydrosis

## 2017-07-03 NOTE — Progress Notes (Signed)
Tyler Hancock is a 37 y.o. male who presents to [facility] today for hyperhidrosis fatigue and lightheadedness.  Tyler Hancock was in his normal state of health earlier this week.  He notes that he had been traveling quite a bit and was in [place] for a while.  On his way home when driving he developed some lightheadedness.  This shortly past and has not recurred.  He denies chest pain palpitations or trouble breathing.    Additionally last few days he notes increased fatigue as well.  He denies any trouble breathing but notes that he will fall asleep more easily.  He notes that he has a history of sleep apnea and uses a CPAP machine.  He notes that he did not bring his CPAP machine with him on the trip and had not been using it.  Additionally Tyler Hancock notes a long history of excessive sweating.  He notes this is been ongoing for years but notes it is quite obnoxious and has been slightly worsened recently.  He denies any medication changes.     ROS as above:  Exam:  BP (!) 144/85   Pulse 72   Ht 6\' 3"  (1.905 m)   Wt (!) 404 lb (183.3 kg)   BMI 50.50 kg/m  Gen: Well NAD HEENT: EOMI,  MMM Lungs: Normal work of breathing. CTABL Heart: RRR no MRG Abd: NABS, Soft. Nondistended, Nontender Exts: Brisk capillary refill, warm and well perfused.  Psych alert and oriented normal speech thought process and affect.  Lab and Radiology Results Lab Results  Component Value Date   TSH 1.35 04/15/2017   Lab Results  Component Value Date   WBC 6.9 04/15/2017   HGB 16.7 04/15/2017   HCT 47.9 04/15/2017   MCV 90.4 04/15/2017   PLT 182 04/15/2017     Chemistry      Component Value Date/Time   NA 140 04/15/2017 0801   K 4.1 04/15/2017 0801   CL 103 04/15/2017 0801   CO2 29 04/15/2017 0801   BUN 14 04/15/2017 0801   CREATININE 0.97 04/15/2017 0801      Component Value  Date/Time   CALCIUM 9.3 04/15/2017 0801   ALKPHOS 45 08/27/2016 0759   AST 21 04/15/2017 0801   ALT 33 04/15/2017 0801   BILITOT 1.0 04/15/2017 0801       Assessment and Plan: 37 y.o. male with  Lightheadedness: Unclear etiology.  Hypoglycemia, hypotension, fatigue due to poorly controlled sleep apnea could all be a factor here.  Plan to proceed with work-up listed below including CBC metabolic panel TSH, D3-U2, insulin and C-peptide.  If all is well would consider a cardiac work-up if symptoms continue.  Additionally of asked patient to use a glucometer that he already has to check his blood sugar occasionally and if he becomes symptomatic again.  Recheck in 1 month.  Fatigue: Think the most likely explanation here is not controlled sleep apnea.  He did not use a CPAP machine on his trip which certainly could make him feel more fatigued.  Additionally the fundamental underlying problem causing lightheadedness may be causing fatigue as well and will proceed with the work-up listed below.  Recheck in 1 month.  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  He is already using topical antiperspirants.  Proceed with work-up for the possibility of hyperthyroidism.  Additionally discussed obesity is a main factor for excessive sweating.  We discussed the  oral medications for this problem are typically not well tolerated but we could use them if needed.  Plan for work-up and recheck in 1 month.   Orders Placed This Encounter  Procedures  . COMPLETE METABOLIC PANEL WITH GFR  . CBC  . TSH  . T4, free  . T3, free  . Insulin, Free (Bioactive)  . C-peptide   Meds ordered this encounter  Medications  . busPIRone (BUSPAR) 10 MG tablet    Sig: Take 1 tablet (10 mg total) by mouth daily as needed.    Dispense:  90 tablet    Refill:  3     Historical information moved to improve visibility of documentation.  Past Medical History:  Diagnosis Date  . Drug-induced  hypothyroidism 06/13/2014  . GAD (generalized anxiety disorder) 12/19/2015  . Hypertension   . Hypotestosteronism 06/13/2014  . Major depression 06/13/2014  . Morbid obesity ([place]) 06/13/2014  . Thyroid disease    No past surgical history on file. Social History   Tobacco Use  . Smoking status: Never Smoker  . Smokeless tobacco: Never Used  Substance Use Topics  . Alcohol use: Yes    Alcohol/week: 0.0 oz   family history includes Schizophrenia in his maternal grandfather; Stroke in his other.  Medications: Current Outpatient Medications  Medication Sig Dispense Refill  . AMBULATORY NON FORMULARY MEDICATION Change Continuous positive airway pressure (CPAP) machine setting to 11 cm of H2O pressure, with 15 min ramp. Send 30 day report after 30 days of use. 1 each 0  . buPROPion (WELLBUTRIN XL) 150 MG 24 hr tablet Take 3 tablets (450 mg total) by mouth daily. 270 tablet 1  . busPIRone (BUSPAR) 10 MG tablet Take 1 tablet (10 mg total) by mouth daily as needed. 90 tablet 3  . levothyroxine (SYNTHROID) 200 MCG tablet Take 1 tablet (200 mcg total) by mouth daily before breakfast. 180 tablet 1  . losartan (COZAAR) 100 MG tablet Take 1 tablet (100 mg total) by mouth daily. 90 tablet 1  . NEEDLE, DISP, 18 G (B-D HYPODERMIC NEEDLE 18GX1.5") 18G X 1-1/2" MISC USE AS DIRECTED to draw up testosterone 10 each prn  . NEEDLE, DISP, 22 G (BD DISP NEEDLES) 22G X 1-1/2" MISC USE AS DIRECTED to inject testosterone 50 each prn  . omeprazole (PRILOSEC) 40 MG capsule Take 1 capsule (40 mg total) by mouth daily. 90 capsule 3  . sertraline (ZOLOFT) 100 MG tablet Take 2 tablets (200 mg total) by mouth daily. 180 tablet 1  . SYRINGE DISPOSABLE 3CC 3 ML MISC Use 1 syringe per week with testosterone injection. 25 each prn  . testosterone cypionate (DEPO-TESTOSTERONE) 200 MG/ML injection Inject 0.75 mLs (150 mg total) into the muscle every 7 (seven) days. 12 mL 1   No current facility-administered medications for this  visit.    No Known Allergies  Health Maintenance Health Maintenance  Topic Date Due  . HIV Screening  07/19/1997  . INFLUENZA VACCINE  09/10/2017  . TETANUS/TDAP  03/29/2018    Discussed warning signs or symptoms. Please see discharge instructions. Patient expresses understanding.

## 2017-07-07 MED ORDER — LEVOTHYROXINE SODIUM 25 MCG PO TABS: 25.0000 ug | ORAL_TABLET | Freq: Every day | ORAL | 1 refills | Status: DC

## 2017-07-07 NOTE — Addendum Note (Signed)
Addended by: Gregor Hams on: 07/07/2017 07:07 AM   Modules accepted: Orders

## 2017-07-09 LAB — COMPLETE METABOLIC PANEL WITH GFR
AG Ratio: 1.7 (calc) (ref 1.0–2.5)
ALKALINE PHOSPHATASE (APISO): 49 U/L (ref 40–115)
ALT: 26 U/L (ref 9–46)
AST: 19 U/L (ref 10–40)
Albumin: 4.5 g/dL (ref 3.6–5.1)
BILIRUBIN TOTAL: 0.7 mg/dL (ref 0.2–1.2)
BUN: 16 mg/dL (ref 7–25)
CHLORIDE: 101 mmol/L (ref 98–110)
CO2: 31 mmol/L (ref 20–32)
Calcium: 9.9 mg/dL (ref 8.6–10.3)
Creat: 1.09 mg/dL (ref 0.60–1.35)
GFR, EST AFRICAN AMERICAN: 102 mL/min/{1.73_m2} (ref >=60)
GFR, Est Non African American: 88 mL/min/{1.73_m2} (ref >=60)
GLOBULIN: 2.7 g/dL (ref 1.9–3.7)
Glucose, Bld: 75 mg/dL (ref 65–99)
Potassium: 4.6 mmol/L (ref 3.5–5.3)
SODIUM: 140 mmol/L (ref 135–146)
TOTAL PROTEIN: 7.2 g/dL (ref 6.1–8.1)

## 2017-07-09 LAB — INSULIN, FREE (BIOACTIVE): INSULIN, FREE: 8.6 u[IU]/mL (ref 1.5–14.9)

## 2017-07-09 LAB — C-PEPTIDE: C PEPTIDE: 2.33 ng/mL (ref 0.80–3.85)

## 2017-07-09 LAB — CBC
HEMATOCRIT: 54 % — AB (ref 38.5–50.0)
Hemoglobin: 18.6 g/dL — ABNORMAL HIGH (ref 13.2–17.1)
MCH: 30.6 pg (ref 27.0–33.0)
MCHC: 34.4 g/dL (ref 32.0–36.0)
MCV: 89 fL (ref 80.0–100.0)
MPV: 11.9 fL (ref 7.5–12.5)
Platelets: 195 10*3/uL (ref 140–400)
RBC: 6.07 10*6/uL — ABNORMAL HIGH (ref 4.20–5.80)
RDW: 12.5 % (ref 11.0–15.0)
WBC: 10.6 10*3/uL (ref 3.8–10.8)

## 2017-07-09 LAB — TSH: TSH: 10.01 m[IU]/L — AB (ref 0.40–4.50)

## 2017-07-09 LAB — T4, FREE: Free T4: 1.4 ng/dL (ref 0.8–1.8)

## 2017-07-09 LAB — T3, FREE: T3, Free: 3.8 pg/mL (ref 2.3–4.2)

## 2017-08-03 ENCOUNTER — Other Ambulatory Visit: Payer: Self-pay | Admitting: Family Medicine

## 2017-08-11 ENCOUNTER — Ambulatory Visit (INDEPENDENT_AMBULATORY_CARE_PROVIDER_SITE_OTHER): Payer: 59 | Admitting: Family Medicine

## 2017-08-11 ENCOUNTER — Encounter: Payer: Self-pay | Admitting: Family Medicine

## 2017-08-11 VITALS — BP 141/90 | HR 86 | Wt >= 6400 oz

## 2017-08-11 DIAGNOSIS — M5412 Radiculopathy, cervical region: Secondary | ICD-10-CM | POA: Diagnosis not present

## 2017-08-11 DIAGNOSIS — R718 Other abnormality of red blood cells: Secondary | ICD-10-CM | POA: Diagnosis not present

## 2017-08-11 DIAGNOSIS — E890 Postprocedural hypothyroidism: Secondary | ICD-10-CM | POA: Diagnosis not present

## 2017-08-11 NOTE — Patient Instructions (Signed)
Thank you for coming in today. You should hear about neck injection soon from [place] and [facility].  Get labs done in about 2-4 weeks.  Labs can be at any [facility] location or if you want [facility] let me know I will change the order.   Take gabapentin for pain as needed.

## 2017-08-12 ENCOUNTER — Encounter: Payer: Self-pay | Admitting: Family Medicine

## 2017-08-12 NOTE — Progress Notes (Signed)
Tyler Hancock is a 37 y.o. male who presents to [place] today for right arm pain.  Tyler Hancock returns to clinic today complaining of pain in the right arm.  He had pain similar to this in the fall and winter 2018.  This is eventually discovered to be due to cervical radiculopathy and had significant improvement in symptoms following a C7 cervical epidural injection at [place] and December 2018.  He notes he is doing well until a few days ago.  He denies any weakness or numbness but does note pain radiating down his arm.  He would like to repeat the injection if possible however he lives and works in [place] and finds driving all the way to [place] difficult and would be interested in a location is more convenient to where he lives.    ROS:  As above  Exam:  BP (!) 141/90   Pulse 86   Wt (!) 401 lb (181.9 kg)   BMI 50.12 kg/m  General: Well Developed, well nourished, and in no acute distress.  Neuro/Psych: Alert and oriented x3, extra-ocular muscles intact, able to move all 4 extremities, sensation grossly intact. Skin: Warm and dry, no rashes noted.  Respiratory: Not using accessory muscles, speaking in full sentences, trachea midline.  Cardiovascular: Pulses palpable, no extremity edema. Abdomen: Does not appear distended. MSK:  C-spine nontender normal neck motion.  Upper extremity strength is equal normal bilaterally.  Sensation is intact.    Lab and Radiology Results EXAM: MRI CERVICAL SPINE WITHOUT CONTRAST  TECHNIQUE: Multiplanar, multisequence MR imaging of the cervical spine was performed. No intravenous contrast was administered.  COMPARISON:  Prior radiographs from 01/20/2017.  FINDINGS: Alignment: Straightening with slight reversal of the normal cervical lordosis. No listhesis or subluxation.  Vertebrae: Vertebral body heights are well maintained without evidence for acute or chronic fracture.  Bone marrow signal intensity within normal limits. The no discrete or worrisome osseous lesions. No abnormal marrow edema.  Cord: Signal intensity within the cervical spinal cord is normal.  Posterior Fossa, vertebral arteries, paraspinal tissues: Visualized brain and posterior fossa within normal limits. Craniocervical junction normal. Paraspinous and prevertebral soft tissues are normal. Normal intravascular flow voids present within the vertebral arteries bilaterally.  Disc levels:  C2-C3: Unremarkable.  C3-C4:  Unremarkable.  C4-C5:  Unremarkable.  C5-C6: Minimal annular disc bulge. No canal stenosis or neural foraminal encroachment.  C6-C7: Broad 3 mm right paracentral disc protrusion indents the right ventral thecal sac, extending laterally towards the right neural foramen (series 5, image 21). Mild flattening of the right hemi cord without cord signal changes. Mild right foraminal stenosis. The ventral right C7 nerve root could be affected. Left neural foramen widely patent.  C7-T1:  Unremarkable.  Visualized upper thoracic spine within normal limits.  IMPRESSION: 1. Right paracentral disc protrusion at C6-7 with secondary flattening of the right hemi cord. The right C7 nerve root could be affected. 2. Minimal annular disc bulging at C5-6 without stenosis or neural impingement.   Electronically Signed   By: Jeannine Boga M.D.   On: 01/26/2017 22:03  FLUOROSCOPY TIME:  1 minutes 0 seconds. 60.31 micro gray meter squared  PROCEDURE: CERVICAL EPIDURAL INJECTION  An interlaminar approach was performed on the right at C7 . A 20 gauge epidural needle was advanced using loss-of-resistance technique.  DIAGNOSTIC EPIDURAL INJECTION  Injection of Isovue-M 300 shows a good epidural pattern with spread above and below the level of needle placement, primarily on the  right. No vascular opacification is seen. THERAPEUTIC  EPIDURAL  INJECTION  1.5 ml of Kenalog 40 mixed with 1 ml of 1% Lidocaine and 2 ml of normal saline were then instilled. The procedure was well-tolerated, and the patient was discharged thirty minutes following the injection in good condition.  IMPRESSION: Technically successful initial epidural injection on the right at C7.   Electronically Signed   By: Nelson Chimes M.D.   On: 02/09/2017 12:15  I personally (independently) visualized and performed the interpretation of the images attached in this note.   Assessment and Plan: 37 y.o. male with  Cervical radiculopathy.  Plan for epidural steroid injection.  We will simultaneously order at [facility] imaging but also try to refer to a location and [place].  If he can get in with the with the same location soon next step is to proceed with the [facility] location.  Additionally will recheck TSH given recent dose adjustment to levothyroxine due to elevated TSH about 6 weeks ago.  Additionally will recheck CBC.  Patient had elevated hemoglobin thought to be CPAP noncompliance.  He is using his CPAP more frequently now.    Orders Placed This Encounter  Procedures  . DG Epidurography    Order Specific Question:   Reason for Exam (SYMPTOM  OR DIAGNOSIS REQUIRED)    Answer:   Rt C7    Order Specific Question:   Preferred imaging location?    Answer:   [facility]  . TSH  . CBC  . Ambulatory referral to Pain Clinic    Referral Priority:   Routine    Referral Type:   Consultation    Referral Reason:   Specialty Services Required    Requested Specialty:   Pain Medicine    Number of Visits Requested:   1   No orders of the defined types were placed in this encounter.   Historical information moved to improve visibility of documentation.  Past Medical History:  Diagnosis Date  . 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 GAD (generalized anxiety disorder) 12/19/2015  . Hypertension   . Hypotestosteronism 06/13/2014  . Major  depression 06/13/2014  . Morbid obesity ([place]) 06/13/2014  . Thyroid disease    No past surgical history on file. Social History   Tobacco Use  . Smoking status: Never Smoker  . Smokeless tobacco: Never Used  Substance Use Topics  . Alcohol use: Yes    Alcohol/week: 0.0 oz   family history includes Schizophrenia in his maternal grandfather; Stroke in his other.  Medications: Current Outpatient Medications  Medication Sig Dispense Refill  . AMBULATORY NON FORMULARY MEDICATION Change Continuous positive airway pressure (CPAP) machine setting to 11 cm of H2O pressure, with 15 min ramp. Send 30 day report after 30 days of use. 1 each 0  . busPIRone (BUSPAR) 10 MG tablet Take 1 tablet (10 mg total) by mouth daily as needed. 90 tablet 3  . levothyroxine (SYNTHROID) 200 MCG tablet Take 1 tablet (200 mcg total) by mouth daily before breakfast. 180 tablet 1  . levothyroxine (SYNTHROID, LEVOTHROID) 25 MCG tablet Take 1 tablet (25 mcg total) by mouth daily. 90 tablet 1  . losartan (COZAAR) 100 MG tablet Take 1 tablet (100 mg total) by mouth daily. 90 tablet 1  . NEEDLE, DISP, 18 G (B-D HYPODERMIC NEEDLE 18GX1.5") 18G X 1-1/2" MISC USE AS DIRECTED to draw up testosterone 10 each prn  . NEEDLE, DISP, 22 G (BD DISP NEEDLES) 22G X 1-1/2" MISC USE AS DIRECTED to  inject testosterone 50 each prn  . omeprazole (PRILOSEC) 40 MG capsule Take 1 capsule (40 mg total) by mouth daily. 90 capsule 3  . sertraline (ZOLOFT) 100 MG tablet Take 2 tablets (200 mg total) by mouth daily. 180 tablet 1  . SYRINGE DISPOSABLE 3CC 3 ML MISC Use 1 syringe per week with testosterone injection. 25 each prn  . testosterone cypionate (DEPO-TESTOSTERONE) 200 MG/ML injection Inject 0.75 mLs (150 mg total) into the muscle every 7 (seven) days. 12 mL 1  . testosterone cypionate (DEPOTESTOSTERONE CYPIONATE) 200 MG/ML injection INJECT 0.75 MLS INTO THE MUSCLE EVERY 7 DAYS 10 mL 1  . buPROPion (WELLBUTRIN XL) 150 MG 24 hr tablet Take 3  tablets (450 mg total) by mouth daily. 270 tablet 1   No current facility-administered medications for this visit.    No Known Allergies    Discussed warning signs or symptoms. Please see discharge instructions. Patient expresses understanding.

## 2017-08-13 MED ORDER — HYDROCODONE-ACETAMINOPHEN 5-325 MG PO TABS: 1.0000 | ORAL_TABLET | Freq: Four times a day (QID) | ORAL | 0 refills | Status: DC | PRN

## 2017-08-18 ENCOUNTER — Telehealth: Payer: Self-pay | Admitting: Family Medicine

## 2017-08-18 NOTE — Telephone Encounter (Signed)
Pt has been scheduled.  °

## 2017-08-18 NOTE — Telephone Encounter (Signed)
Please contact [facility] radiology about epidural steroid injection.

## 2017-09-09 ENCOUNTER — Other Ambulatory Visit: Payer: Self-pay | Admitting: Family Medicine

## 2017-09-10 ENCOUNTER — Ambulatory Visit (INDEPENDENT_AMBULATORY_CARE_PROVIDER_SITE_OTHER): Payer: 59 | Admitting: Family Medicine

## 2017-09-10 ENCOUNTER — Encounter: Payer: Self-pay | Admitting: Family Medicine

## 2017-09-10 VITALS — BP 163/84 | HR 85 | Wt >= 6400 oz

## 2017-09-10 DIAGNOSIS — R200 Anesthesia of skin: Secondary | ICD-10-CM

## 2017-09-10 DIAGNOSIS — E349 Endocrine disorder, unspecified: Secondary | ICD-10-CM | POA: Diagnosis not present

## 2017-09-10 DIAGNOSIS — R718 Other abnormality of red blood cells: Secondary | ICD-10-CM

## 2017-09-10 IMAGING — DX DG SHOULDER 2+V*R*
3 series · 4 of 4 positions shown · non-contrast
Comparison: None.

CLINICAL DATA: Right shoulder pain for 3-4 months

EXAM:
RIGHT SHOULDER - 2+ VIEW

[shoulder grashey]
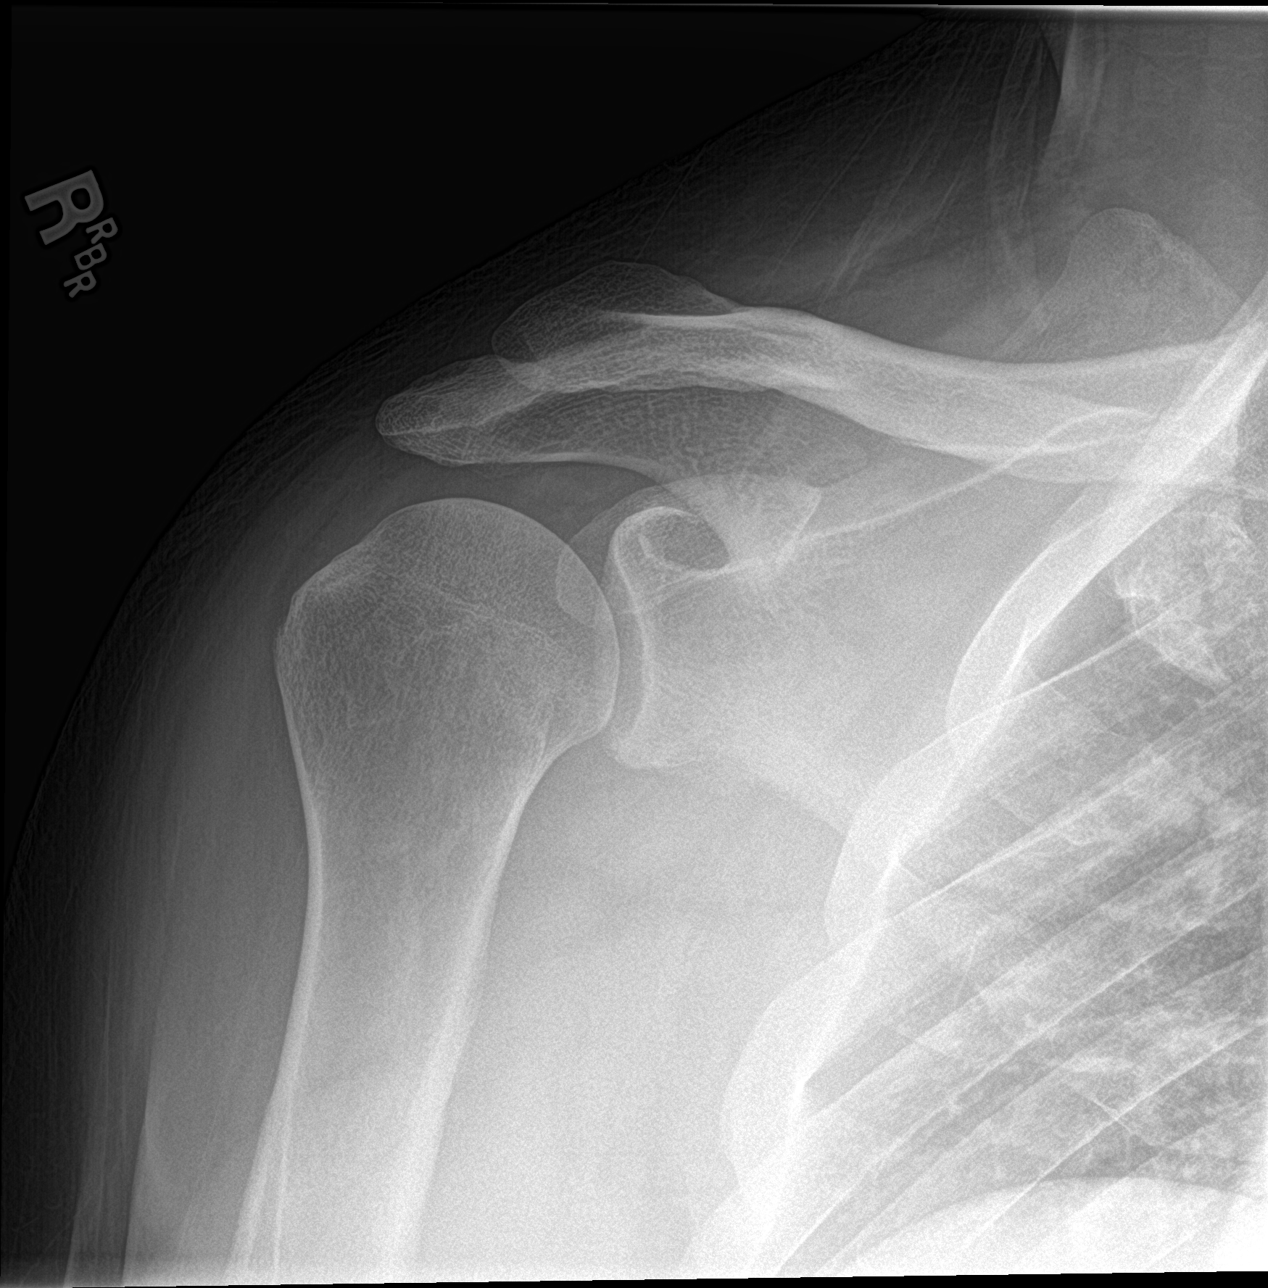

[Series 2: shoulder y view · 0.14mm/px · 2 of 2 slices shown]
[im 1/2]
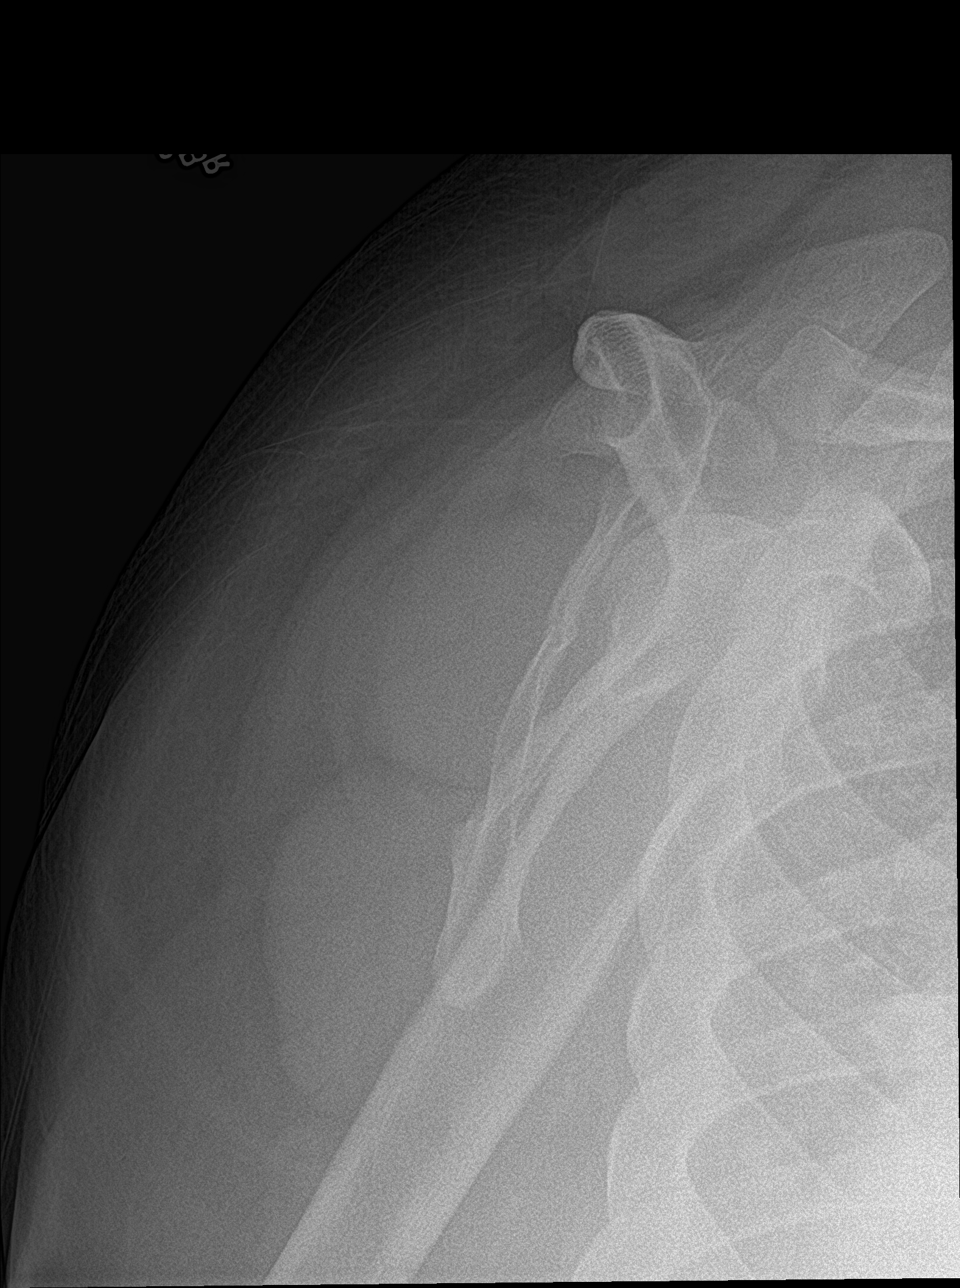
[im 2/2]
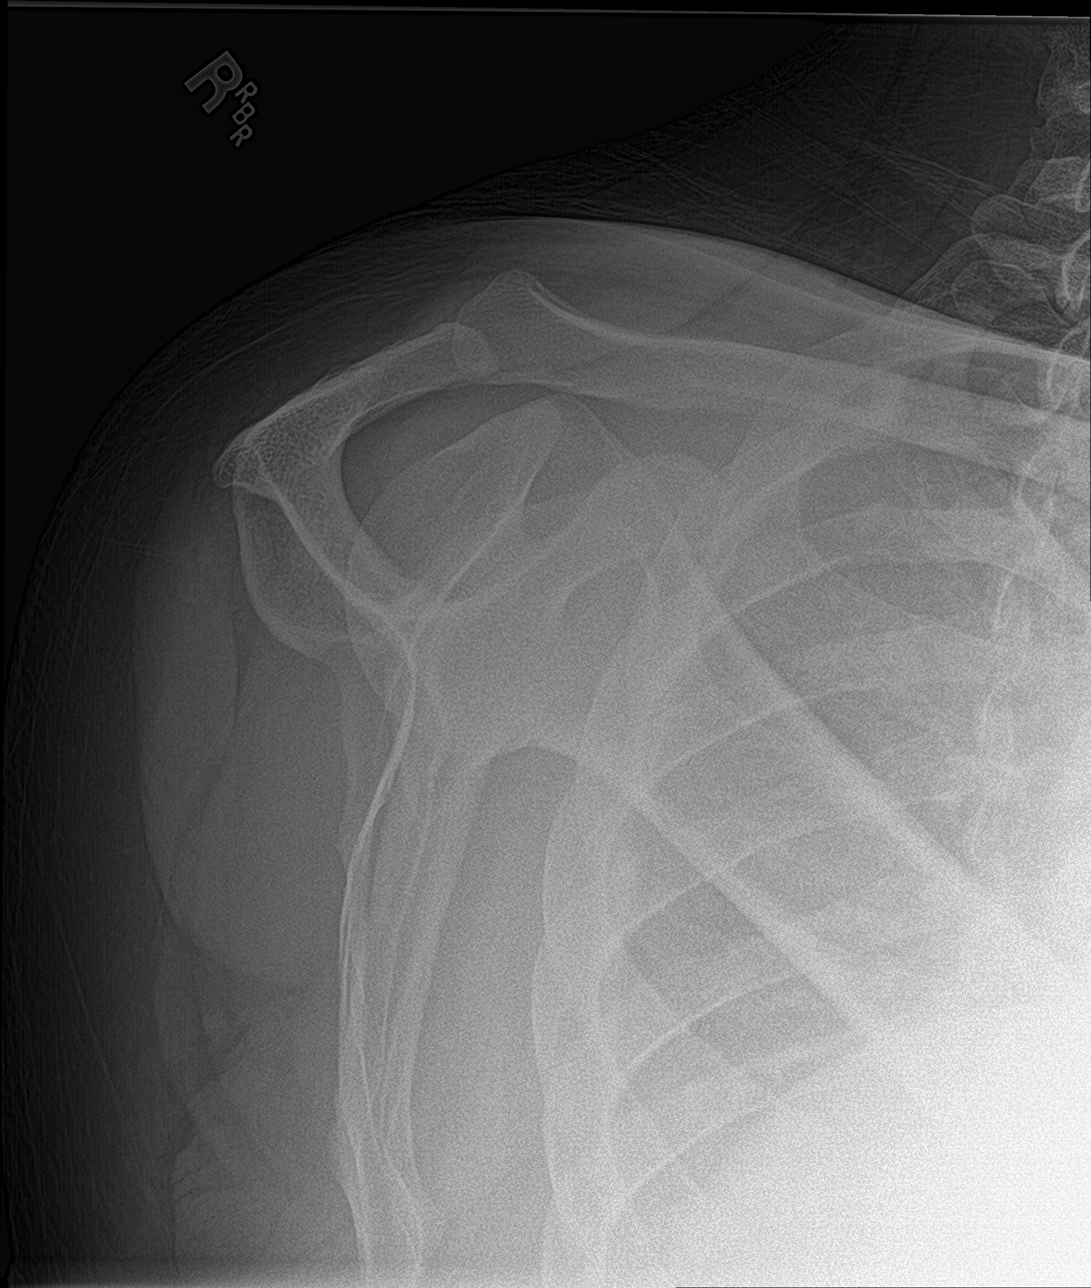

[shoulder axillary]
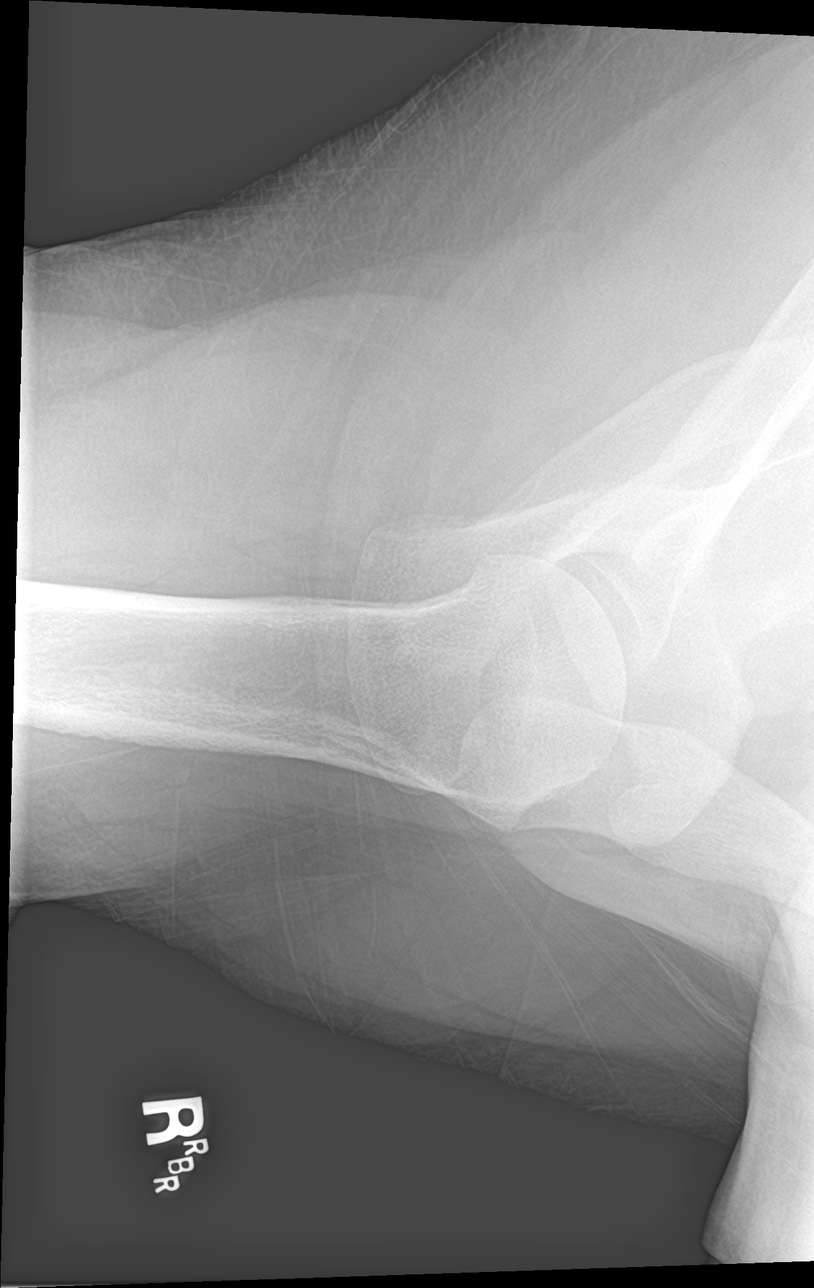

[4 of 4 positions shown; findings below may reference images not displayed]

FINDINGS: There is irregularity of the mid body of the scapula. The
glenohumeral and acromioclavicular joints are normal.
IMPRESSION: Irregularity of the midportion of the scapular body may indicate an
age indeterminate fracture. Correlation for tenderness at this
location and/or CT of the right shoulder is recommended.

## 2017-09-10 MED ORDER — TESTOSTERONE CYPIONATE 200 MG/ML IM SOLN
150.0000 mg | INTRAMUSCULAR | 1 refills | Status: DC
Start: 2017-09-10 — End: 2018-03-22

## 2017-09-10 NOTE — Patient Instructions (Signed)
Thank you for coming in today. We will continue the testosterone at 0.26ml We will follow along the numbness.  Think about shoe fit.  If we need to the next test is an EMG/Nerve conduction study.   Recheck as needed.    Peripheral Neuropathy Peripheral neuropathy is a type of nerve damage. It affects nerves that carry signals between the spinal cord and other parts of the body. These are called peripheral nerves. With peripheral neuropathy, one nerve or a group of nerves may be damaged. What are the causes? Many things can damage peripheral nerves. For some people with peripheral neuropathy, the cause is unknown. Some causes include:  Diabetes. This is the most common cause of peripheral neuropathy.  Injury to a nerve.  Pressure or stress on a nerve that lasts a long time.  Too little vitamin B. Alcoholism can lead to this.  Infections.  Autoimmune diseases, such as multiple sclerosis and systemic lupus erythematosus.  Inherited nerve diseases.  Some medicines, such as cancer drugs.  Toxic substances, such as lead and mercury.  Too little blood flowing to the legs.  Kidney disease.  Thyroid disease.  What are the signs or symptoms? Different people have different symptoms. The symptoms you have will depend on which of your nerves is damaged. Common symptoms include:  Loss of feeling (numbness) in the feet and hands.  Tingling in the feet and hands.  Pain that burns.  Very sensitive skin.  Weakness.  Not being able to move a part of the body (paralysis).  Muscle twitching.  Clumsiness or poor coordination.  Loss of balance.  Not being able to control your bladder.  Feeling dizzy.  Sexual problems.  How is this diagnosed? Peripheral neuropathy is a symptom, not a disease. Finding the cause of peripheral neuropathy can be hard. To figure that out, your health care provider will take a medical history and do a physical exam. A neurological exam will also  be done. This involves checking things affected by your brain, spinal cord, and nerves (nervous system). For example, your health care provider will check your reflexes, how you move, and what you can feel. Other types of tests may also be ordered, such as:  Blood tests.  A test of the fluid in your spinal cord.  Imaging tests, such as CT scans or an MRI.  Electromyography (EMG). This test checks the nerves that control muscles.  Nerve conduction velocity tests. These tests check how fast messages pass through your nerves.  Nerve biopsy. A small piece of nerve is removed. It is then checked under a microscope.  How is this treated?  Medicine is often used to treat peripheral neuropathy. Medicines may include: ? Pain-relieving medicines. Prescription or over-the-counter medicine may be suggested. ? Antiseizure medicine. This may be used for pain. ? Antidepressants. These also may help ease pain from neuropathy. ? Lidocaine. This is a numbing medicine. You might wear a patch or be given a shot. ? Mexiletine. This medicine is typically used to help control irregular heart rhythms.  Surgery. Surgery may be needed to relieve pressure on a nerve or to destroy a nerve that is causing pain.  Physical therapy to help movement.  Assistive devices to help movement. Follow these instructions at home:  Only take over-the-counter or prescription medicines as directed by your health care provider. Follow the instructions carefully for any given medicines. Do not take any other medicines without first getting approval from your health care provider.  If you have  diabetes, work closely with your health care provider to keep your blood sugar under control.  If you have numbness in your feet: ? Check every day for signs of injury or infection. Watch for redness, warmth, and swelling. ? Wear padded socks and comfortable shoes. These help protect your feet.  Do not do things that put pressure on  your damaged nerve.  Do not smoke. Smoking keeps blood from getting to damaged nerves.  Avoid or limit alcohol. Too much alcohol can cause a lack of B vitamins. These vitamins are needed for healthy nerves.  Develop a good support system. Coping with peripheral neuropathy can be stressful. Talk to a mental health specialist or join a support group if you are struggling.  Follow up with your health care provider as directed. Contact a health care provider if:  You have new signs or symptoms of peripheral neuropathy.  You are struggling emotionally from dealing with peripheral neuropathy.  You have a fever. Get help right away if:  You have an injury or infection that is not healing.  You feel very dizzy or begin vomiting.  You have chest pain.  You have trouble breathing. This information is not intended to replace advice given to you by your health care provider. Make sure you discuss any questions you have with your health care provider. Document Released: 01/17/2002 Document Revised: 07/05/2015 Document Reviewed: 10/04/2012 Elsevier Interactive Patient Education  2017 [employer].

## 2017-09-11 DIAGNOSIS — R200 Anesthesia of skin: Secondary | ICD-10-CM | POA: Insufficient documentation

## 2017-09-11 NOTE — Progress Notes (Signed)
Tyler Hancock is a 37 y.o. male who presents to [facility] today for right foot numbness.  Tyler Hancock notes numbness at the right lateral foot.  He describes a distribution along the lateral foot including the fifth toe extending to the ankle a bit.  He notes this is been present for months but is been more noticeable in the last 2 to 3 weeks.  He denies any weakness or pain or tingling.  He denies any other weakness or numbness in his lower extremities.  He has a history of right cervical radiculopathy that has been treated very well with epidural steroid injections.  He is concerned that his foot symptoms might be related to his cervical radicular pain.  He denies any injury fevers or chills.  Additionally patient notes that when his testosterone was reduced from 200 mg to 150 mg (0.2ml from 51ml) he is having to waste the 0.25 mL of testosterone is left over in the single-use vial and is going to run out because the pharmacy does not know that we have changed his dose.  He continues to tolerate testosterone well.   ROS as above:  Exam:  BP (!) 163/84   Pulse 85   Wt (!) 402 lb (182.3 kg)   BMI 50.25 kg/m  Gen: Well NAD obese HEENT: EOMI,  MMM Lungs: Normal work of breathing. CTABL Heart: RRR no MRG Abd: NABS, Soft. Nondistended, Nontender Exts: Brisk capillary refill, warm and well perfused.  Right foot normal-appearing no deformities. Pulses and capillary refill are intact. Intact monofilament testing to the foot and ankle with the exception of mildly decreased sensitivity at the lateral foot extending to the area just proximal to the lateral malleolus. L-spine nontender normal lumbar motion.  Negative slump test bilaterally.  Reflexes and strength are intact bilateral lower extremities. Normal gait.  Lab and Radiology Results Lab Results  Component Value Date   TESTOSTERONE 147 (L) 04/15/2017   Lab Results  Component Value Date   WBC 10.6 07/03/2017   HGB 18.6 (H) 07/03/2017   HCT 54.0 (H) 07/03/2017   MCV 89.0 07/03/2017   PLT 195 07/03/2017      Assessment and Plan: 37 y.o. male with  Right lateral foot numbness.  Mild and isolated.  Fortunately Tyler Hancock does not have any other neurologic symptoms that are concerning today.  His symptoms are quite mild and just mainly brings it to my attention because he wants to make sure he does not represent anything dangerous.  I do not think his numbness is particularly dangerous today and we discussed treatment and evaluation options including nerve conduction study and lumbar MRI.  Both Tyler Hancock and I would like to proceed with watchful waiting.  If worsening next step would likely be nerve conduction study.  He has had some metabolic work-up already in the past and additionally we may want to consider further metabolic work-up including recheck TSH B12 RPR etc.  Testosterone: We had to reduce the dose of the testosterone due to elevated hemoglobin likely due to not 100% use of CPAP as well as testosterone.  Updated testosterone prescription and recheck in the near future as per protocol for testosterone.   No orders of the defined types were placed in this encounter.  Meds ordered this encounter  Medications  . testosterone cypionate (DEPOTESTOSTERONE CYPIONATE) 200 MG/ML injection    Sig: Inject 0.75 mLs (150 mg total) into the muscle once a week.  Dispense:  12 mL    Refill:  1    Tyler Hancock will have to waste the remaining 0.12ml as the bottle does not have preservative.     Historical information moved to improve visibility of documentation.  Past Medical History:  Diagnosis Date  . 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 GAD (generalized anxiety disorder) 12/19/2015  . Hypertension   . Hypotestosteronism 06/13/2014  . Major depression 06/13/2014  . Morbid obesity ([place]) 06/13/2014  . Thyroid disease      No past surgical history on file. Social History   Tobacco Use  . Smoking status: Never Smoker  . Smokeless tobacco: Never Used  Substance Use Topics  . Alcohol use: Yes    Alcohol/week: 0.0 oz   family history includes Schizophrenia in his maternal grandfather; Stroke in his other.  Medications: Current Outpatient Medications  Medication Sig Dispense Refill  . AMBULATORY NON FORMULARY MEDICATION Change Continuous positive airway pressure (CPAP) machine setting to 11 cm of H2O pressure, with 15 min ramp. Send 30 day report after 30 days of use. 1 each 0  . busPIRone (BUSPAR) 10 MG tablet Take 1 tablet (10 mg total) by mouth daily as needed. 90 tablet 3  . HYDROcodone-acetaminophen (NORCO/VICODIN) 5-325 MG tablet Take 1 tablet by mouth every 6 (six) hours as needed. 10 tablet 0  . levothyroxine (SYNTHROID) 200 MCG tablet Take 1 tablet (200 mcg total) by mouth daily before breakfast. 180 tablet 1  . levothyroxine (SYNTHROID, LEVOTHROID) 25 MCG tablet Take 1 tablet (25 mcg total) by mouth daily. 90 tablet 1  . NEEDLE, DISP, 18 G (B-D HYPODERMIC NEEDLE 18GX1.5") 18G X 1-1/2" MISC USE AS DIRECTED to draw up testosterone 10 each prn  . NEEDLE, DISP, 22 G (BD DISP NEEDLES) 22G X 1-1/2" MISC USE AS DIRECTED to inject testosterone 50 each prn  . omeprazole (PRILOSEC) 40 MG capsule Take 1 capsule (40 mg total) by mouth daily. 90 capsule 3  . sertraline (ZOLOFT) 100 MG tablet Take 2 tablets (200 mg total) by mouth daily. 180 tablet 1  . SYRINGE DISPOSABLE 3CC 3 ML MISC Use 1 syringe per week with testosterone injection. 25 each prn  . testosterone cypionate (DEPOTESTOSTERONE CYPIONATE) 200 MG/ML injection Inject 0.75 mLs (150 mg total) into the muscle once a week. 12 mL 1  . buPROPion (WELLBUTRIN XL) 150 MG 24 hr tablet Take 3 tablets (450 mg total) by mouth daily. 270 tablet 1  . losartan (COZAAR) 100 MG tablet TAKE 1 TABLET BY MOUTH ONCE DAILY 90 tablet 0   No current facility-administered  medications for this visit.    No Known Allergies   Discussed warning signs or symptoms. Please see discharge instructions. Patient expresses understanding.

## 2017-10-01 ENCOUNTER — Other Ambulatory Visit: Payer: Self-pay

## 2017-10-01 MED ORDER — OMEPRAZOLE 40 MG PO CPDR: 40.0000 mg | DELAYED_RELEASE_CAPSULE | Freq: Every day | ORAL | 3 refills | Status: DC

## 2017-10-06 ENCOUNTER — Encounter: Payer: Self-pay | Admitting: Family Medicine

## 2017-10-13 ENCOUNTER — Ambulatory Visit: Payer: Self-pay | Admitting: Family Medicine

## 2017-11-30 ENCOUNTER — Encounter: Payer: Self-pay | Admitting: Family Medicine

## 2017-12-08 ENCOUNTER — Encounter: Payer: Self-pay | Admitting: Family Medicine

## 2017-12-08 DIAGNOSIS — F411 Generalized anxiety disorder: Secondary | ICD-10-CM

## 2017-12-08 DIAGNOSIS — E349 Endocrine disorder, unspecified: Secondary | ICD-10-CM

## 2017-12-08 DIAGNOSIS — F330 Major depressive disorder, recurrent, mild: Secondary | ICD-10-CM

## 2017-12-08 DIAGNOSIS — R5383 Other fatigue: Secondary | ICD-10-CM

## 2017-12-08 DIAGNOSIS — I10 Essential (primary) hypertension: Secondary | ICD-10-CM

## 2017-12-08 DIAGNOSIS — E890 Postprocedural hypothyroidism: Secondary | ICD-10-CM

## 2017-12-09 ENCOUNTER — Encounter: Payer: Self-pay | Admitting: Family Medicine

## 2017-12-09 DIAGNOSIS — F411 Generalized anxiety disorder: Secondary | ICD-10-CM

## 2017-12-09 MED ORDER — BUSPIRONE HCL 10 MG PO TABS: 10.0000 mg | ORAL_TABLET | Freq: Every day | ORAL | 3 refills | Status: DC | PRN

## 2017-12-09 MED ORDER — LEVOTHYROXINE SODIUM 25 MCG PO TABS: 25.0000 ug | ORAL_TABLET | Freq: Every day | ORAL | 1 refills | Status: DC

## 2017-12-09 MED ORDER — VALSARTAN 160 MG PO TABS: 160.0000 mg | ORAL_TABLET | Freq: Every day | ORAL | 1 refills | Status: DC

## 2017-12-21 ENCOUNTER — Encounter: Payer: Self-pay | Admitting: Family Medicine

## 2017-12-21 ENCOUNTER — Ambulatory Visit (INDEPENDENT_AMBULATORY_CARE_PROVIDER_SITE_OTHER): Payer: 59 | Admitting: Family Medicine

## 2017-12-21 VITALS — BP 161/90 | HR 86 | Ht 75.0 in | Wt 392.0 lb

## 2017-12-21 DIAGNOSIS — Z23 Encounter for immunization: Secondary | ICD-10-CM | POA: Diagnosis not present

## 2017-12-21 DIAGNOSIS — I10 Essential (primary) hypertension: Secondary | ICD-10-CM

## 2017-12-21 DIAGNOSIS — Z7184 Encounter for health counseling related to travel: Secondary | ICD-10-CM | POA: Diagnosis not present

## 2017-12-21 DIAGNOSIS — M5412 Radiculopathy, cervical region: Secondary | ICD-10-CM | POA: Diagnosis not present

## 2017-12-21 MED ORDER — PREDNISONE 50 MG PO TABS: 50.0000 mg | ORAL_TABLET | Freq: Every day | ORAL | 0 refills | Status: DC

## 2017-12-21 MED ORDER — TYPHOID VACCINE PO CPDR: 1.0000 | DELAYED_RELEASE_CAPSULE | ORAL | 0 refills | Status: DC

## 2017-12-21 MED ORDER — DOXYCYCLINE HYCLATE 100 MG PO TABS: ORAL_TABLET | ORAL | 0 refills | Status: DC

## 2017-12-21 NOTE — Patient Instructions (Addendum)
Thank you for coming in today. Get labs soonish.   Take the oral typhoid vaccine now.   Get flu and HepA/B vaccines now.   Flu vaccine today.   Avoid petting animals.  If you get bitten by a wild animal let me know and seek medical attention to get rabies vaccines.

## 2017-12-22 ENCOUNTER — Encounter: Payer: Self-pay | Admitting: Family Medicine

## 2017-12-22 DIAGNOSIS — M5412 Radiculopathy, cervical region: Secondary | ICD-10-CM | POA: Insufficient documentation

## 2017-12-22 NOTE — Progress Notes (Signed)
Tyler Hancock is a 37 y.o. male who presents to [facility] today for travel.  Tyler Hancock is planning on traveling to [place] in early December for a work trip for about a week.  He is interested in discussing his travel medical needs.  He is pretty sure that he has not been vaccinated against hepatitis A or B.  Additionally he is pretty sure he has not had a typhoid vaccine anytime recently.  He is never taken malaria prophylaxis in the past.  Additionally he notes that his cervical radicular pain is just starting to return mildly.  He had an injection in July of this year which worked until recently.  He notes his symptoms are quite mild at the time and would like to consider repeat injection in the future would like to wait until he returns from [place] because he does not want have an injection in his neck just prior to leaving for [place].   ROS as above:  Exam:  BP (!) 161/90   Pulse 86   Ht 6\' 3"  (1.905 m)   Wt (!) 392 lb (177.8 kg)   BMI 49.00 kg/m  Wt Readings from Last 5 Encounters:  12/21/17 (!) 392 lb (177.8 kg)  09/10/17 (!) 402 lb (182.3 kg)  08/11/17 (!) 401 lb (181.9 kg)  07/03/17 (!) 404 lb (183.3 kg)  04/17/17 (!) 403 lb (182.8 kg)    Gen: Well NAD HEENT: EOMI,  MMM Lungs: Normal work of breathing. CTABL Heart: RRR no MRG Abd: NABS, Soft. Nondistended, Nontender Exts: Brisk capillary refill, warm and well perfused.   Lab and Radiology Results No results found for this or any previous visit (from the past 72 hour(s)). No results found.    Assessment and Plan: 37 y.o. male with  Travel encounter: Administer hepatitis A and B combined vaccines today. Return in 1 month for nurse visit in 6 months for nurse visit to receive second and third dose.  Additionally will administer oral typhoid vaccine.  Lastly will prescribe doxycycline for malaria  prophylaxis.  Cervical spine radicular pain.  Watchful waiting at this time.  I have prescribed a course of prednisone that Tyler Hancock can take if he develops worsening pain while in [place].  Consider repeat injection upon arrival back to the [place] if worsening.  Blood Pressure bit elevated today.  Plan for home measurement.   Orders Placed This Encounter  Procedures  . Hepatitis A hepatitis B combined vaccine IM  . Flu Vaccine QUAD 6+ mos PF IM (Fluarix Quad PF)   Meds ordered this encounter  Medications  . typhoid (VIVOTIF) DR capsule    Sig: Take 1 capsule by mouth every other day.    Dispense:  4 capsule    Refill:  0  . doxycycline (VIBRA-TABS) 100 MG tablet    Sig: 1 po daily 2 days prior to travel continuing during travel and for 4 weeks after your return for malaria prevention.    Dispense:  90 tablet    Refill:  0  . predniSONE (DELTASONE) 50 MG tablet    Sig: Take 1 tablet (50 mg total) by mouth daily. For neck pain if worsening while traveling    Dispense:  7 tablet    Refill:  0     Historical information moved to improve visibility of documentation.  Past Medical History:  Diagnosis Date  . 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 GAD (generalized anxiety disorder) 12/19/2015  .  Hypertension   . Hypotestosteronism 06/13/2014  . Major depression 06/13/2014  . Morbid obesity ([place]) 06/13/2014  . Thyroid disease    No past surgical history on file. Social History   Tobacco Use  . Smoking status: Never Smoker  . Smokeless tobacco: Never Used  Substance Use Topics  . Alcohol use: Yes    Alcohol/week: 0.0 standard drinks   family history includes Schizophrenia in his maternal grandfather; Stroke in his other.  Medications: Current Outpatient Medications  Medication Sig Dispense Refill  . AMBULATORY NON FORMULARY MEDICATION Change Continuous positive airway pressure (CPAP) machine setting to 11 cm of H2O pressure, with 15 min ramp. Send 30 day report after 30 days  of use. 1 each 0  . busPIRone (BUSPAR) 10 MG tablet Take 1 tablet (10 mg total) by mouth daily as needed. 90 tablet 3  . levothyroxine (SYNTHROID) 200 MCG tablet Take 1 tablet (200 mcg total) by mouth daily before breakfast. 180 tablet 1  . levothyroxine (SYNTHROID, LEVOTHROID) 25 MCG tablet Take 1 tablet (25 mcg total) by mouth daily. 90 tablet 1  . NEEDLE, DISP, 18 G (B-D HYPODERMIC NEEDLE 18GX1.5") 18G X 1-1/2" MISC USE AS DIRECTED to draw up testosterone 10 each prn  . NEEDLE, DISP, 22 G (BD DISP NEEDLES) 22G X 1-1/2" MISC USE AS DIRECTED to inject testosterone 50 each prn  . omeprazole (PRILOSEC) 40 MG capsule Take 1 capsule (40 mg total) by mouth daily. 90 capsule 3  . sertraline (ZOLOFT) 100 MG tablet Take 2 tablets (200 mg total) by mouth daily. 180 tablet 1  . SYRINGE DISPOSABLE 3CC 3 ML MISC Use 1 syringe per week with testosterone injection. 25 each prn  . testosterone cypionate (DEPOTESTOSTERONE CYPIONATE) 200 MG/ML injection Inject 0.75 mLs (150 mg total) into the muscle once a week. 12 mL 1  . valsartan (DIOVAN) 160 MG tablet Take 1 tablet (160 mg total) by mouth daily. 90 tablet 1  . buPROPion (WELLBUTRIN XL) 150 MG 24 hr tablet Take 3 tablets (450 mg total) by mouth daily. 270 tablet 1  . doxycycline (VIBRA-TABS) 100 MG tablet 1 po daily 2 days prior to travel continuing during travel and for 4 weeks after your return for malaria prevention. 90 tablet 0  . predniSONE (DELTASONE) 50 MG tablet Take 1 tablet (50 mg total) by mouth daily. For neck pain if worsening while traveling 7 tablet 0  . typhoid (VIVOTIF) DR capsule Take 1 capsule by mouth every other day. 4 capsule 0   No current facility-administered medications for this visit.    No Known Allergies   Discussed warning signs or symptoms. Please see discharge instructions. Patient expresses understanding.

## 2018-01-31 ENCOUNTER — Other Ambulatory Visit: Payer: Self-pay | Admitting: Family Medicine

## 2018-02-11 DIAGNOSIS — I10 Essential (primary) hypertension: Secondary | ICD-10-CM | POA: Diagnosis not present

## 2018-02-11 DIAGNOSIS — F330 Major depressive disorder, recurrent, mild: Secondary | ICD-10-CM | POA: Diagnosis not present

## 2018-02-11 DIAGNOSIS — E890 Postprocedural hypothyroidism: Secondary | ICD-10-CM | POA: Diagnosis not present

## 2018-02-11 DIAGNOSIS — R5383 Other fatigue: Secondary | ICD-10-CM | POA: Diagnosis not present

## 2018-02-12 ENCOUNTER — Other Ambulatory Visit: Payer: Self-pay | Admitting: Family Medicine

## 2018-02-12 LAB — CBC
HEMATOCRIT: 52.2 % — AB (ref 38.5–50.0)
HEMOGLOBIN: 18 g/dL — AB (ref 13.2–17.1)
MCH: 31.6 pg (ref 27.0–33.0)
MCHC: 34.5 g/dL (ref 32.0–36.0)
MCV: 91.7 fL (ref 80.0–100.0)
MPV: 12.3 fL (ref 7.5–12.5)
Platelets: 198 10*3/uL (ref 140–400)
RBC: 5.69 10*6/uL (ref 4.20–5.80)
RDW: 12.9 % (ref 11.0–15.0)
WBC: 8.2 10*3/uL (ref 3.8–10.8)

## 2018-02-12 LAB — COMPLETE METABOLIC PANEL WITH GFR
AG Ratio: 1.5 (calc) (ref 1.0–2.5)
ALT: 29 U/L (ref 9–46)
AST: 21 U/L (ref 10–40)
Albumin: 4.3 g/dL (ref 3.6–5.1)
Alkaline phosphatase (APISO): 53 U/L (ref 40–115)
BUN: 15 mg/dL (ref 7–25)
CALCIUM: 9.4 mg/dL (ref 8.6–10.3)
CO2: 29 mmol/L (ref 20–32)
CREATININE: 1.14 mg/dL (ref 0.60–1.35)
Chloride: 101 mmol/L (ref 98–110)
GFR, EST NON AFRICAN AMERICAN: 83 mL/min/{1.73_m2} (ref >=60)
GFR, Est African American: 96 mL/min/{1.73_m2} (ref >=60)
GLUCOSE: 86 mg/dL (ref 65–99)
Globulin: 2.9 g/dL (calc) (ref 1.9–3.7)
Potassium: 4.5 mmol/L (ref 3.5–5.3)
SODIUM: 139 mmol/L (ref 135–146)
Total Bilirubin: 0.7 mg/dL (ref 0.2–1.2)
Total Protein: 7.2 g/dL (ref 6.1–8.1)

## 2018-02-12 LAB — HEMOGLOBIN A1C
HEMOGLOBIN A1C: 5 %{Hb} (ref <5.7)
Mean Plasma Glucose: 97 (calc)
eAG (mmol/L): 5.4 (calc)

## 2018-02-12 LAB — TESTOSTERONE: Testosterone: 218 ng/dL — ABNORMAL LOW (ref 250–827)

## 2018-02-12 LAB — TSH: TSH: 8.66 m[IU]/L — AB (ref 0.40–4.50)

## 2018-02-12 LAB — PSA: PSA: 0.5 ng/mL (ref <=4.0)

## 2018-02-12 MED ORDER — LEVOTHYROXINE SODIUM 50 MCG PO TABS: 50.0000 ug | ORAL_TABLET | Freq: Every day | ORAL | 1 refills | Status: DC

## 2018-02-13 ENCOUNTER — Encounter: Payer: Self-pay | Admitting: Family Medicine

## 2018-03-22 ENCOUNTER — Encounter: Payer: Self-pay | Admitting: Family Medicine

## 2018-03-22 DIAGNOSIS — F411 Generalized anxiety disorder: Secondary | ICD-10-CM

## 2018-03-22 MED ORDER — BUPROPION HCL ER (XL) 150 MG PO TB24: 450.0000 mg | ORAL_TABLET | Freq: Every day | ORAL | 1 refills | Status: DC

## 2018-03-22 MED ORDER — SERTRALINE HCL 100 MG PO TABS: 200.0000 mg | ORAL_TABLET | Freq: Every day | ORAL | 1 refills | Status: DC

## 2018-03-22 MED ORDER — TESTOSTERONE CYPIONATE 200 MG/ML IM SOLN: 150.0000 mg | INTRAMUSCULAR | 1 refills | Status: AC

## 2018-03-22 MED ORDER — LOSARTAN POTASSIUM 100 MG PO TABS: 100.0000 mg | ORAL_TABLET | Freq: Every day | ORAL | 1 refills | Status: DC

## 2018-03-22 MED ORDER — BUSPIRONE HCL 10 MG PO TABS: 10.0000 mg | ORAL_TABLET | Freq: Every day | ORAL | 3 refills | Status: DC | PRN

## 2018-03-22 MED ORDER — LEVOTHYROXINE SODIUM 50 MCG PO TABS: 50.0000 ug | ORAL_TABLET | Freq: Every day | ORAL | 1 refills | Status: DC

## 2018-03-22 MED ORDER — LEVOTHYROXINE SODIUM 200 MCG PO TABS: 200.0000 ug | ORAL_TABLET | Freq: Every day | ORAL | 1 refills | Status: DC

## 2018-05-11 ENCOUNTER — Other Ambulatory Visit: Payer: Self-pay | Admitting: Family Medicine

## 2018-05-11 ENCOUNTER — Other Ambulatory Visit: Payer: Self-pay

## 2018-05-11 MED ORDER — BUPROPION HCL ER (XL) 150 MG PO TB24: 450.0000 mg | ORAL_TABLET | Freq: Every day | ORAL | 1 refills | Status: DC

## 2018-05-25 ENCOUNTER — Other Ambulatory Visit: Payer: Self-pay

## 2018-05-25 MED ORDER — LEVOTHYROXINE SODIUM 50 MCG PO TABS: 50.0000 ug | ORAL_TABLET | Freq: Every day | ORAL | 1 refills | Status: DC

## 2018-05-25 MED ORDER — BUPROPION HCL ER (XL) 150 MG PO TB24: 450.0000 mg | ORAL_TABLET | Freq: Every day | ORAL | 1 refills | Status: DC

## 2018-06-01 ENCOUNTER — Encounter: Payer: Self-pay | Admitting: Family Medicine

## 2018-06-01 ENCOUNTER — Ambulatory Visit (INDEPENDENT_AMBULATORY_CARE_PROVIDER_SITE_OTHER): Payer: BLUE CROSS/BLUE SHIELD | Admitting: Family Medicine

## 2018-06-01 VITALS — BP 139/81 | HR 74 | Temp 98.1°F | Wt 389.0 lb

## 2018-06-01 DIAGNOSIS — Z23 Encounter for immunization: Secondary | ICD-10-CM

## 2018-06-01 DIAGNOSIS — R718 Other abnormality of red blood cells: Secondary | ICD-10-CM

## 2018-06-01 DIAGNOSIS — E890 Postprocedural hypothyroidism: Secondary | ICD-10-CM | POA: Diagnosis not present

## 2018-06-01 DIAGNOSIS — M5412 Radiculopathy, cervical region: Secondary | ICD-10-CM

## 2018-06-01 MED ORDER — GABAPENTIN 300 MG PO CAPS: ORAL_CAPSULE | ORAL | 3 refills | Status: DC

## 2018-06-01 NOTE — Telephone Encounter (Signed)
Patient scheduled.

## 2018-06-01 NOTE — Patient Instructions (Addendum)
Thank you for coming in today.  Ok to use gabapentin for pain,  You should hear about injection.  Jolayne Haines will give you the Tdap shot today.   Get labs.  Keep me updated.   We can do future visits remotely if needed.    Cervical Radiculopathy  Cervical radiculopathy means that a nerve in the neck is pinched or bruised. This can cause pain or loss of feeling (numbness) that runs from your neck to your arm and fingers. Follow these instructions at home: Managing pain  Take over-the-counter and prescription medicines only as told by your doctor.  If directed, put ice on the injured or painful area. ? Put ice in a plastic bag. ? Place a towel between your skin and the bag. ? Leave the ice on for 20 minutes, 2-3 times per day.  If ice does not help, you can try using heat. Take a warm shower or warm bath, or use a heat pack as told by your doctor.  You may try a gentle neck and shoulder massage. Activity  Rest as needed. Follow instructions from your doctor about any activities to avoid.  Do exercises as told by your doctor or physical therapist. General instructions  If you were given a soft collar, wear it as told by your doctor.  Use a flat pillow when you sleep.  Keep all follow-up visits as told by your doctor. This is important. Contact a doctor if:  Your condition does not improve with treatment. Get help right away if:  Your pain gets worse and is not controlled with medicine.  You lose feeling or feel weak in your hand, arm, face, or leg.  You have a fever.  You have a stiff neck.  You cannot control when you poop or pee (have incontinence).  You have trouble with walking, balance, or talking. This information is not intended to replace advice given to you by your health care provider. Make sure you discuss any questions you have with your health care provider. Document Released: 01/16/2011 Document Revised: 07/05/2015 Document Reviewed:  03/23/2014 Elsevier Interactive Patient Education  2019 [employer].

## 2018-06-01 NOTE — Progress Notes (Signed)
Tyler Hancock is a 38 y.o. male who presents to [facility] today for right arm and shoulder pain.  Shoulder pain. Tyler Hancock has a history of right arm pain thought to be related to right C7 cervical radiculopathy.  He had MRI in December 2018 that did show paracentral disc protrusion at C6-7 impinging right C7 nerve root.  He had successful epidural steroid injection in December 2018 and again in July 2019.    He notes in the interim his pain has returned over the last few weeks.  He notes bothersome shoulder and arm pain worse at bedtime.  Pain is interfering with sleep and activity.  He denies significant weakness or loss of function.  He would be interested in repeat injection if possible.  He is taking gabapentin which helps only a little.   Additionally he would like to follow-up hypothyroidism.  He takes levothyroxine listed below.  His dose was increased in January response to elevated TSH.  He feels well with no issues.  Hypertension managed by medications as below.  No chest pain palpitations shortness of breath.  Additionally patient had elevated hemoglobin when checked in January.  ROS as above:  Exam:  BP 139/81   Pulse 74   Temp 98.1 F (36.7 C) (Oral)   Wt (!) 389 lb (176.4 kg)   BMI 48.62 kg/m  Wt Readings from Last 5 Encounters:  06/01/18 (!) 389 lb (176.4 kg)  12/21/17 (!) 392 lb (177.8 kg)  09/10/17 (!) 402 lb (182.3 kg)  08/11/17 (!) 401 lb (181.9 kg)  07/03/17 (!) 404 lb (183.3 kg)    Gen: Well NAD HEENT: EOMI,  MMM Lungs: Normal work of breathing. CTABL Heart: RRR no MRG Abd: NABS, Soft. Nondistended, Nontender Exts: Brisk capillary refill, warm and well perfused.  C-spine: Normal cervical motion.  Right-sided Spurling's test positive.  Upper extremity strength reflexes and sensation are equal and normal throughout.  Lab and Radiology  Results EXAM: MRI CERVICAL SPINE WITHOUT CONTRAST  TECHNIQUE: Multiplanar, multisequence MR imaging of the cervical spine was performed. No intravenous contrast was administered.  COMPARISON:  Prior radiographs from 01/20/2017.  FINDINGS: Alignment: Straightening with slight reversal of the normal cervical lordosis. No listhesis or subluxation.  Vertebrae: Vertebral body heights are well maintained without evidence for acute or chronic fracture. Bone marrow signal intensity within normal limits. The no discrete or worrisome osseous lesions. No abnormal marrow edema.  Cord: Signal intensity within the cervical spinal cord is normal.  Posterior Fossa, vertebral arteries, paraspinal tissues: Visualized brain and posterior fossa within normal limits. Craniocervical junction normal. Paraspinous and prevertebral soft tissues are normal. Normal intravascular flow voids present within the vertebral arteries bilaterally.  Disc levels:  C2-C3: Unremarkable.  C3-C4:  Unremarkable.  C4-C5:  Unremarkable.  C5-C6: Minimal annular disc bulge. No canal stenosis or neural foraminal encroachment.  C6-C7: Broad 3 mm right paracentral disc protrusion indents the right ventral thecal sac, extending laterally towards the right neural foramen (series 5, image 21). Mild flattening of the right hemi cord without cord signal changes. Mild right foraminal stenosis. The ventral right C7 nerve root could be affected. Left neural foramen widely patent.  C7-T1:  Unremarkable.  Visualized upper thoracic spine within normal limits.  IMPRESSION: 1. Right paracentral disc protrusion at C6-7 with secondary flattening of the right hemi cord. The right C7 nerve root could be affected. 2. Minimal annular disc bulging at C5-6 without stenosis or neural  impingement.   Electronically Signed   By: Jeannine Boga M.D.   On: 01/26/2017 22:03 I personally (independently)  visualized and performed the interpretation of the images attached in this note.     Assessment and Plan: 38 y.o. male with  Right arm pain.  Very likely cervical radiculopathy based on prior history.  Patient has benefited significantly from previous episodes of epidural steroid injection.  Plan to repeat epidural steroid injection in the near future.  Refer back to last location in [place].  Of unable to have injection due to COVID-19 will refer to location in [place].  Discussed titration of gabapentin reasonable to continue that as needed.  Hypothyroidism: Due for recheck.  Recheck TSH and adjust levothyroxine dose.  Elevated hemoglobin: Recheck CBC.  Patient also due for Tdap.  Will give Tdap vaccine today prior to discharge.  PDMP not reviewed this encounter. Orders Placed This Encounter  Procedures  . Tdap vaccine greater than or equal to 7yo IM  . TSH  . CBC  . Ambulatory referral to Pain Clinic    Referral Priority:   Routine    Referral Type:   Consultation    Referral Reason:   Specialty Services Required    Referred to Provider:   Valarie Cones, MD    Requested Specialty:   Pain Medicine    Number of Visits Requested:   1   Meds ordered this encounter  Medications  . gabapentin (NEURONTIN) 300 MG capsule    Sig: One tab PO qHS for a week, then BID for a week, then TID. May double weekly to a max of 3,600mg /day    Dispense:  180 capsule    Refill:  3     Historical information moved to improve visibility of documentation.  Past Medical History:  Diagnosis Date  . 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 GAD (generalized anxiety disorder) 12/19/2015  . Hypertension   . Hypotestosteronism 06/13/2014  . Major depression 06/13/2014  . Morbid obesity ([place]) 06/13/2014  . Thyroid disease    No past surgical history on file. Social History   Tobacco Use  . Smoking status: Never Smoker  . Smokeless tobacco: Never Used  Substance Use Topics  . Alcohol  use: Yes    Alcohol/week: 0.0 standard drinks   family history includes Schizophrenia in his maternal grandfather; Stroke in an other family member.  Medications: Current Outpatient Medications  Medication Sig Dispense Refill  . AMBULATORY NON FORMULARY MEDICATION Change Continuous positive airway pressure (CPAP) machine setting to 11 cm of H2O pressure, with 15 min ramp. Send 30 day report after 30 days of use. 1 each 0  . buPROPion (WELLBUTRIN XL) 150 MG 24 hr tablet Take 3 tablets (450 mg total) by mouth daily. 270 tablet 1  . busPIRone (BUSPAR) 10 MG tablet Take 1 tablet (10 mg total) by mouth daily as needed. 90 tablet 3  . levothyroxine (SYNTHROID, LEVOTHROID) 200 MCG tablet Take 1 tablet (200 mcg total) by mouth daily before breakfast. 90 tablet 1  . levothyroxine (SYNTHROID, LEVOTHROID) 50 MCG tablet Take 1 tablet (50 mcg total) by mouth daily. 90 tablet 1  . losartan (COZAAR) 100 MG tablet Take 1 tablet (100 mg total) by mouth daily. 90 tablet 1  . NEEDLE, DISP, 18 G (B-D HYPODERMIC NEEDLE 18GX1.5") 18G X 1-1/2" MISC USE AS DIRECTED to draw up testosterone 10 each prn  . NEEDLE, DISP, 22 G (BD DISP NEEDLES) 22G X 1-1/2" MISC USE AS DIRECTED to inject testosterone  50 each prn  . omeprazole (PRILOSEC) 40 MG capsule Take 1 capsule (40 mg total) by mouth daily. 90 capsule 3  . sertraline (ZOLOFT) 100 MG tablet Take 2 tablets (200 mg total) by mouth daily. 180 tablet 1  . SYRINGE DISPOSABLE 3CC 3 ML MISC Use 1 syringe per week with testosterone injection. 25 each prn  . testosterone cypionate (DEPOTESTOSTERONE CYPIONATE) 200 MG/ML injection Inject 0.75 mLs (150 mg total) into the muscle once a week. 18 mL 1  . valsartan (DIOVAN) 160 MG tablet Take 1 tablet (160 mg total) by mouth daily. 90 tablet 1  . gabapentin (NEURONTIN) 300 MG capsule One tab PO qHS for a week, then BID for a week, then TID. May double weekly to a max of 3,600mg /day 180 capsule 3   No current facility-administered  medications for this visit.    No Known Allergies   Discussed warning signs or symptoms. Please see discharge instructions. Patient expresses understanding.

## 2018-06-02 ENCOUNTER — Encounter: Payer: Self-pay | Admitting: Family Medicine

## 2018-06-02 LAB — CBC
HCT: 50.2 % — ABNORMAL HIGH (ref 38.5–50.0)
Hemoglobin: 17.1 g/dL (ref 13.2–17.1)
MCH: 31 pg (ref 27.0–33.0)
MCHC: 34.1 g/dL (ref 32.0–36.0)
MCV: 90.9 fL (ref 80.0–100.0)
MPV: 12.2 fL (ref 7.5–12.5)
Platelets: 197 10*3/uL (ref 140–400)
RBC: 5.52 10*6/uL (ref 4.20–5.80)
RDW: 12.8 % (ref 11.0–15.0)
WBC: 9 10*3/uL (ref 3.8–10.8)

## 2018-06-02 LAB — TSH: TSH: 9.88 mIU/L — ABNORMAL HIGH (ref 0.40–4.50)

## 2018-06-02 MED ORDER — LEVOTHYROXINE SODIUM 75 MCG PO TABS: 75.0000 ug | ORAL_TABLET | Freq: Every day | ORAL | 1 refills | Status: DC

## 2018-06-02 MED ORDER — "NEEDLE (DISP) 22G X 1-1/2"" MISC": 99 refills | Status: DC

## 2018-06-02 MED ORDER — "NEEDLE (DISP) 18G X 1-1/2"" MISC": 99 refills | Status: DC

## 2018-06-02 MED ORDER — "NEEDLE (DISP) 18G X 1-1/2"" MISC": 1 refills | Status: AC

## 2018-06-02 MED ORDER — "NEEDLE (DISP) 22G X 1-1/2"" MISC": 1 refills | Status: DC

## 2018-06-02 NOTE — Addendum Note (Signed)
Addended by: Gregor Hams on: 06/02/2018 06:59 AM   Modules accepted: Orders

## 2018-06-02 NOTE — Addendum Note (Signed)
Addended by: Lavon Paganini on: 06/02/2018 04:31 PM   Modules accepted: Orders

## 2018-06-03 NOTE — Telephone Encounter (Signed)
New referral to Dr. Francesco Runner placed for epidural steroid injection.

## 2018-06-07 DIAGNOSIS — M4722 Other spondylosis with radiculopathy, cervical region: Secondary | ICD-10-CM | POA: Diagnosis not present

## 2018-06-07 DIAGNOSIS — M5030 Other cervical disc degeneration, unspecified cervical region: Secondary | ICD-10-CM | POA: Diagnosis not present

## 2018-06-07 DIAGNOSIS — M47812 Spondylosis without myelopathy or radiculopathy, cervical region: Secondary | ICD-10-CM | POA: Diagnosis not present

## 2018-06-07 DIAGNOSIS — M9981 Other biomechanical lesions of cervical region: Secondary | ICD-10-CM | POA: Diagnosis not present

## 2018-06-07 NOTE — Telephone Encounter (Signed)
Referral sent - CF °

## 2018-06-08 ENCOUNTER — Encounter: Payer: Self-pay | Admitting: Family Medicine

## 2018-06-08 NOTE — Progress Notes (Signed)
Patient was seen by Dr. Francesco Runner on April 27 and will proceed to epidural steroid injection

## 2018-06-11 DIAGNOSIS — M9981 Other biomechanical lesions of cervical region: Secondary | ICD-10-CM | POA: Diagnosis not present

## 2018-06-11 DIAGNOSIS — M47812 Spondylosis without myelopathy or radiculopathy, cervical region: Secondary | ICD-10-CM | POA: Diagnosis not present

## 2018-06-11 DIAGNOSIS — M5030 Other cervical disc degeneration, unspecified cervical region: Secondary | ICD-10-CM | POA: Diagnosis not present

## 2018-07-01 IMAGING — XA DG INJECT/[PERSON_NAME] INC NEEDLE/CATH/PLC EPI/CERV/THOR W/IMG
2 series · 2 of 2 positions shown · non-contrast
Comparison: none

CLINICAL DATA: Spondylosis without myelopathy. Right-sided disc
protrusion C6-7. Right shoulder and arm symptoms.

[Series 1: ortho standard · 1 of 1 slices shown (1 of 2)]
[im 1/1]
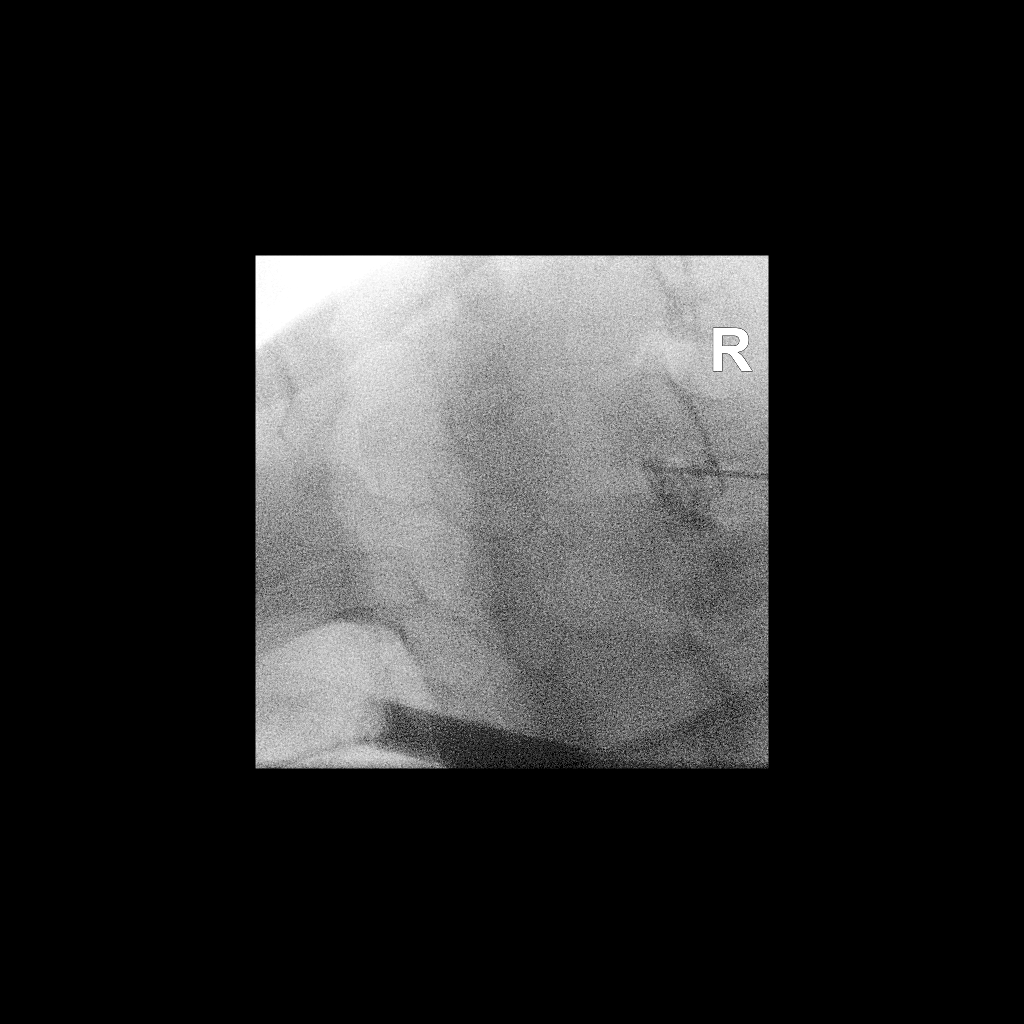

[Series 2: ortho standard · 1 of 1 slices shown (2 of 2)]
[im 1/1]
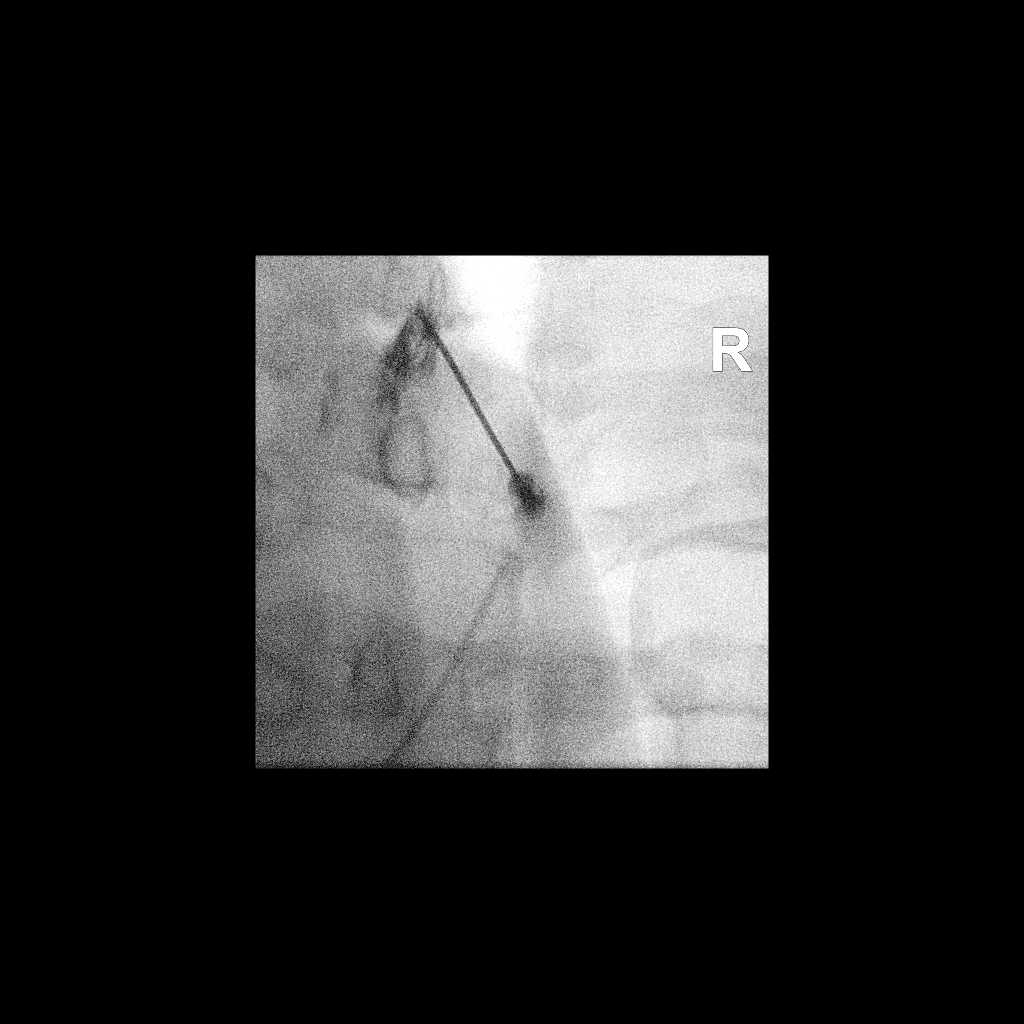

[2 of 2 positions shown; findings below may reference images not displayed]

FLUOROSCOPY TIME:  1 minutes 0 seconds. 60.31 micro gray meter
squared

PROCEDURE:
CERVICAL EPIDURAL INJECTION

An interlaminar approach was performed on the right at C7 . A 20
gauge epidural needle was advanced using loss-of-resistance
technique.

DIAGNOSTIC EPIDURAL INJECTION

Injection of Isovue-M 300 shows a good epidural pattern with spread
above and below the level of needle placement, primarily on the
right. No vascular opacification is seen. THERAPEUTIC

EPIDURAL INJECTION

1.5 ml of Kenalog 40 mixed with 1 ml of 1% Lidocaine and 2 ml of
normal saline were then instilled. The procedure was well-tolerated,
and the patient was discharged thirty minutes following the
injection in good condition.
IMPRESSION: Technically successful initial epidural injection on the right at
C7.

## 2018-07-16 DIAGNOSIS — M5030 Other cervical disc degeneration, unspecified cervical region: Secondary | ICD-10-CM | POA: Diagnosis not present

## 2018-07-16 DIAGNOSIS — M9981 Other biomechanical lesions of cervical region: Secondary | ICD-10-CM | POA: Diagnosis not present

## 2018-07-16 DIAGNOSIS — M47812 Spondylosis without myelopathy or radiculopathy, cervical region: Secondary | ICD-10-CM | POA: Diagnosis not present

## 2018-07-16 DIAGNOSIS — M4722 Other spondylosis with radiculopathy, cervical region: Secondary | ICD-10-CM | POA: Diagnosis not present

## 2018-07-19 ENCOUNTER — Telehealth: Payer: Self-pay | Admitting: Family Medicine

## 2018-07-19 DIAGNOSIS — R718 Other abnormality of red blood cells: Secondary | ICD-10-CM

## 2018-07-19 DIAGNOSIS — E349 Endocrine disorder, unspecified: Secondary | ICD-10-CM

## 2018-07-19 DIAGNOSIS — Z5181 Encounter for therapeutic drug level monitoring: Secondary | ICD-10-CM

## 2018-07-19 DIAGNOSIS — E890 Postprocedural hypothyroidism: Secondary | ICD-10-CM

## 2018-07-19 NOTE — Telephone Encounter (Signed)
-----   Message from Gregor Hams, MD sent at 06/02/2018  6:59 AM EDT ----- Regarding: Recheck TSH Recheck TSH after increasing dose.

## 2018-07-19 NOTE — Telephone Encounter (Signed)
You are due for recheck TSH after increasing levothyroxine dose.  Additionally were at the 43-month mark where it makes sense to to recheck testosterone levels.  Please check testosterone fasting in between doses.

## 2018-07-21 DIAGNOSIS — M9981 Other biomechanical lesions of cervical region: Secondary | ICD-10-CM | POA: Diagnosis not present

## 2018-07-21 DIAGNOSIS — M5030 Other cervical disc degeneration, unspecified cervical region: Secondary | ICD-10-CM | POA: Diagnosis not present

## 2018-07-29 DIAGNOSIS — M9981 Other biomechanical lesions of cervical region: Secondary | ICD-10-CM | POA: Diagnosis not present

## 2018-07-29 DIAGNOSIS — M47812 Spondylosis without myelopathy or radiculopathy, cervical region: Secondary | ICD-10-CM | POA: Diagnosis not present

## 2018-07-29 DIAGNOSIS — M25511 Pain in right shoulder: Secondary | ICD-10-CM | POA: Diagnosis not present

## 2018-07-29 DIAGNOSIS — M5030 Other cervical disc degeneration, unspecified cervical region: Secondary | ICD-10-CM | POA: Diagnosis not present

## 2018-07-30 DIAGNOSIS — M50122 Cervical disc disorder at C5-C6 level with radiculopathy: Secondary | ICD-10-CM | POA: Diagnosis not present

## 2018-07-30 DIAGNOSIS — M50123 Cervical disc disorder at C6-C7 level with radiculopathy: Secondary | ICD-10-CM | POA: Diagnosis not present

## 2018-08-19 ENCOUNTER — Other Ambulatory Visit: Payer: Self-pay

## 2018-08-19 ENCOUNTER — Telehealth: Payer: Self-pay | Admitting: *Deleted

## 2018-08-19 ENCOUNTER — Telehealth (INDEPENDENT_AMBULATORY_CARE_PROVIDER_SITE_OTHER): Payer: BC Managed Care – PPO | Admitting: Family Medicine

## 2018-08-19 VITALS — Temp 97.0°F

## 2018-08-19 DIAGNOSIS — R51 Headache: Secondary | ICD-10-CM | POA: Diagnosis not present

## 2018-08-19 DIAGNOSIS — R6889 Other general symptoms and signs: Secondary | ICD-10-CM | POA: Diagnosis not present

## 2018-08-19 DIAGNOSIS — R519 Headache, unspecified: Secondary | ICD-10-CM

## 2018-08-19 DIAGNOSIS — M791 Myalgia, unspecified site: Secondary | ICD-10-CM

## 2018-08-19 DIAGNOSIS — Z20822 Contact with and (suspected) exposure to covid-19: Secondary | ICD-10-CM

## 2018-08-19 NOTE — Progress Notes (Signed)
Virtual Visit  via Video Note  I connected with      Tyler Hancock by a video enabled telemedicine application and verified that I am speaking with the correct person using two identifiers.   I discussed the limitations of evaluation and management by telemedicine and the availability of in person appointments. The patient expressed understanding and agreed to proceed.  History of Present Illness: Tyler Hancock is a 38 y.o. male who would like to discuss fatigue.  Symptoms started yesterday.   Went to bed early this night. Feeling a mild sore throat. Tyler Hancock denies any nasal congestion or runny nose. Having chills and body aches and mild headache. No fever. No tick bite. No sick contacts. Tried motrin which helps a little.    Observations/Objective: Temp (!) 97 F (36.1 C)  Wt Readings from Last 5 Encounters:  06/01/18 (!) 389 lb (176.4 kg)  12/21/17 (!) 392 lb (177.8 kg)  09/10/17 (!) 402 lb (182.3 kg)  08/11/17 (!) 401 lb (181.9 kg)  07/03/17 (!) 404 lb (183.3 kg)   Exam: Appearance nontoxic appearing.  Alert and oriented. Normal Speech.  No tachypnea shortness of breath or wheezing visible or audible.    Assessment and Plan: 38 y.o. male with body aches headache mild sore throat subjective chills.  Present for less than 1 day.  Etiology is obviously somewhat unclear however is concerning for COVID-19.  Plan for outpatient COVID testing.  Watchful waiting and symptomatic management with over-the-counter medications.  Discuss or recheck if worsening.  Will treat empirically if patient develops cough with antibiotics for possible pneumonia.  PDMP not reviewed this encounter. No orders of the defined types were placed in this encounter.  No orders of the defined types were placed in this encounter.   Follow Up Instructions:    I discussed the assessment and treatment plan with the patient. The patient was provided an opportunity to ask questions and all were answered. The  patient agreed with the plan and demonstrated an understanding of the instructions.   The patient was advised to call back or seek an in-person evaluation if the symptoms worsen or if the condition fails to improve as anticipated.  Time: 15 minutes of intraservice time, with >22 minutes of total time during today's visit.      Historical information moved to improve visibility of documentation.  Past Medical History:  Diagnosis Date  . 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 GAD (generalized anxiety disorder) 12/19/2015  . Hypertension   . Hypotestosteronism 06/13/2014  . Major depression 06/13/2014  . Morbid obesity ([place]) 06/13/2014  . Thyroid disease    No past surgical history on file. Social History   Tobacco Use  . Smoking status: Never Smoker  . Smokeless tobacco: Never Used  Substance Use Topics  . Alcohol use: Yes    Alcohol/week: 0.0 standard drinks   family history includes Schizophrenia in his maternal grandfather; Stroke in an other family member.  Medications: Current Outpatient Medications  Medication Sig Dispense Refill  . AMBULATORY NON FORMULARY MEDICATION Change Continuous positive airway pressure (CPAP) machine setting to 11 cm of H2O pressure, with 15 min ramp. Send 30 day report after 30 days of use. 1 each 0  . buPROPion (WELLBUTRIN XL) 150 MG 24 hr tablet Take 3 tablets (450 mg total) by mouth daily. 270 tablet 1  . busPIRone (BUSPAR) 10 MG tablet Take 1 tablet (10 mg total) by mouth daily as needed. 90 tablet 3  . gabapentin (NEURONTIN) 300  MG capsule One tab PO qHS for a week, then BID for a week, then TID. May double weekly to a max of 3,600mg /day 180 capsule 3  . levothyroxine (SYNTHROID) 75 MCG tablet Take 1 tablet (75 mcg total) by mouth daily. 90 tablet 1  . levothyroxine (SYNTHROID, LEVOTHROID) 200 MCG tablet Take 1 tablet (200 mcg total) by mouth daily before breakfast. 90 tablet 1  . losartan (COZAAR) 100 MG tablet Take 1 tablet (100 mg total) by  mouth daily. 90 tablet 1  . NEEDLE, DISP, 18 G (B-D HYPODERMIC NEEDLE 18GX1.5") 18G X 1-1/2" MISC USE AS DIRECTED to draw up testosterone 100 each 1  . NEEDLE, DISP, 22 G (BD DISP NEEDLES) 22G X 1-1/2" MISC USE AS DIRECTED to inject testosterone 100 each 1  . omeprazole (PRILOSEC) 40 MG capsule Take 1 capsule (40 mg total) by mouth daily. 90 capsule 3  . sertraline (ZOLOFT) 100 MG tablet Take 2 tablets (200 mg total) by mouth daily. 180 tablet 1  . SYRINGE DISPOSABLE 3CC 3 ML MISC Use 1 syringe per week with testosterone injection. 25 each prn  . testosterone cypionate (DEPOTESTOSTERONE CYPIONATE) 200 MG/ML injection Inject 0.75 mLs (150 mg total) into the muscle once a week. 18 mL 1  . valsartan (DIOVAN) 160 MG tablet Take 1 tablet (160 mg total) by mouth daily. 90 tablet 1   No current facility-administered medications for this visit.    No Known Allergies

## 2018-08-19 NOTE — Telephone Encounter (Signed)
-----   Message from Gregor Hams, MD sent at 08/19/2018  8:45 AM EDT ----- Regarding: Needs covid test Please call and schedule outpaitent COVID test

## 2018-08-19 NOTE — Telephone Encounter (Signed)
Pt has scheduled an appointment to have the covid-19 test done at a [facility] tomorrow.

## 2018-08-20 ENCOUNTER — Encounter (INDEPENDENT_AMBULATORY_CARE_PROVIDER_SITE_OTHER): Payer: Self-pay

## 2018-08-20 DIAGNOSIS — Z20828 Contact with and (suspected) exposure to other viral communicable diseases: Secondary | ICD-10-CM | POA: Diagnosis not present

## 2018-08-22 ENCOUNTER — Telehealth: Payer: Self-pay

## 2018-08-22 NOTE — Telephone Encounter (Signed)
Attempted to call pt to discuss sx. Pt given information for diarrhea through MyChart.

## 2018-08-23 ENCOUNTER — Encounter (INDEPENDENT_AMBULATORY_CARE_PROVIDER_SITE_OTHER): Payer: Self-pay

## 2018-08-28 ENCOUNTER — Encounter (INDEPENDENT_AMBULATORY_CARE_PROVIDER_SITE_OTHER): Payer: Self-pay

## 2018-08-30 DIAGNOSIS — M542 Cervicalgia: Secondary | ICD-10-CM | POA: Diagnosis not present

## 2018-08-30 DIAGNOSIS — M5412 Radiculopathy, cervical region: Secondary | ICD-10-CM | POA: Diagnosis not present

## 2018-08-30 DIAGNOSIS — M5030 Other cervical disc degeneration, unspecified cervical region: Secondary | ICD-10-CM | POA: Diagnosis not present

## 2018-08-30 DIAGNOSIS — M5020 Other cervical disc displacement, unspecified cervical region: Secondary | ICD-10-CM | POA: Diagnosis not present

## 2018-08-30 DIAGNOSIS — M4182 Other forms of scoliosis, cervical region: Secondary | ICD-10-CM | POA: Diagnosis not present

## 2018-08-30 DIAGNOSIS — M47812 Spondylosis without myelopathy or radiculopathy, cervical region: Secondary | ICD-10-CM | POA: Diagnosis not present

## 2018-09-06 ENCOUNTER — Encounter: Payer: Self-pay | Admitting: Family Medicine

## 2018-09-07 ENCOUNTER — Other Ambulatory Visit: Payer: Self-pay | Admitting: Family Medicine

## 2018-09-10 DIAGNOSIS — M5412 Radiculopathy, cervical region: Secondary | ICD-10-CM | POA: Diagnosis not present

## 2018-09-10 DIAGNOSIS — M5030 Other cervical disc degeneration, unspecified cervical region: Secondary | ICD-10-CM | POA: Diagnosis not present

## 2018-09-10 DIAGNOSIS — M4802 Spinal stenosis, cervical region: Secondary | ICD-10-CM | POA: Diagnosis not present

## 2018-09-10 DIAGNOSIS — M542 Cervicalgia: Secondary | ICD-10-CM | POA: Diagnosis not present

## 2018-09-23 DIAGNOSIS — E0500 Thyrotoxicosis with diffuse goiter without thyrotoxic crisis or storm: Secondary | ICD-10-CM | POA: Diagnosis not present

## 2018-09-23 DIAGNOSIS — Z01818 Encounter for other preprocedural examination: Secondary | ICD-10-CM | POA: Diagnosis not present

## 2018-09-23 DIAGNOSIS — M5010 Cervical disc disorder with radiculopathy, unspecified cervical region: Secondary | ICD-10-CM | POA: Diagnosis not present

## 2018-09-23 DIAGNOSIS — Z01812 Encounter for preprocedural laboratory examination: Secondary | ICD-10-CM | POA: Diagnosis not present

## 2018-09-23 DIAGNOSIS — I10 Essential (primary) hypertension: Secondary | ICD-10-CM | POA: Diagnosis not present

## 2018-09-23 DIAGNOSIS — M5020 Other cervical disc displacement, unspecified cervical region: Secondary | ICD-10-CM | POA: Diagnosis not present

## 2018-09-23 DIAGNOSIS — M4802 Spinal stenosis, cervical region: Secondary | ICD-10-CM | POA: Diagnosis not present

## 2018-09-23 DIAGNOSIS — F419 Anxiety disorder, unspecified: Secondary | ICD-10-CM | POA: Diagnosis not present

## 2018-09-23 DIAGNOSIS — Z01810 Encounter for preprocedural cardiovascular examination: Secondary | ICD-10-CM | POA: Diagnosis not present

## 2018-10-02 DIAGNOSIS — Z01812 Encounter for preprocedural laboratory examination: Secondary | ICD-10-CM | POA: Diagnosis not present

## 2018-10-02 DIAGNOSIS — Z20828 Contact with and (suspected) exposure to other viral communicable diseases: Secondary | ICD-10-CM | POA: Diagnosis not present

## 2018-10-06 DIAGNOSIS — M50223 Other cervical disc displacement at C6-C7 level: Secondary | ICD-10-CM | POA: Diagnosis not present

## 2018-10-06 DIAGNOSIS — M5412 Radiculopathy, cervical region: Secondary | ICD-10-CM | POA: Diagnosis not present

## 2018-10-06 DIAGNOSIS — M50123 Cervical disc disorder at C6-C7 level with radiculopathy: Secondary | ICD-10-CM | POA: Diagnosis not present

## 2018-10-06 DIAGNOSIS — Z6841 Body Mass Index (BMI) 40.0 and over, adult: Secondary | ICD-10-CM | POA: Diagnosis not present

## 2018-10-06 DIAGNOSIS — K219 Gastro-esophageal reflux disease without esophagitis: Secondary | ICD-10-CM | POA: Diagnosis not present

## 2018-10-06 DIAGNOSIS — I10 Essential (primary) hypertension: Secondary | ICD-10-CM | POA: Diagnosis not present

## 2018-10-06 DIAGNOSIS — Z981 Arthrodesis status: Secondary | ICD-10-CM | POA: Diagnosis not present

## 2018-10-06 HISTORY — PX: ANTERIOR CERVICAL DECOMP/DISCECTOMY FUSION: SHX1161

## 2018-10-07 DIAGNOSIS — K219 Gastro-esophageal reflux disease without esophagitis: Secondary | ICD-10-CM | POA: Diagnosis not present

## 2018-10-07 DIAGNOSIS — I10 Essential (primary) hypertension: Secondary | ICD-10-CM | POA: Diagnosis not present

## 2018-10-07 DIAGNOSIS — M5412 Radiculopathy, cervical region: Secondary | ICD-10-CM | POA: Diagnosis not present

## 2018-10-07 DIAGNOSIS — M542 Cervicalgia: Secondary | ICD-10-CM | POA: Diagnosis not present

## 2018-10-07 DIAGNOSIS — Z6841 Body Mass Index (BMI) 40.0 and over, adult: Secondary | ICD-10-CM | POA: Diagnosis not present

## 2018-10-07 DIAGNOSIS — M50223 Other cervical disc displacement at C6-C7 level: Secondary | ICD-10-CM | POA: Diagnosis not present

## 2018-10-26 DIAGNOSIS — M5020 Other cervical disc displacement, unspecified cervical region: Secondary | ICD-10-CM | POA: Diagnosis not present

## 2018-10-26 DIAGNOSIS — Z981 Arthrodesis status: Secondary | ICD-10-CM | POA: Diagnosis not present

## 2018-10-27 DIAGNOSIS — G4733 Obstructive sleep apnea (adult) (pediatric): Secondary | ICD-10-CM | POA: Diagnosis not present

## 2018-11-18 ENCOUNTER — Other Ambulatory Visit: Payer: Self-pay | Admitting: Family Medicine

## 2018-11-19 NOTE — Telephone Encounter (Signed)
Patient needs to have labs

## 2018-11-23 DIAGNOSIS — Z981 Arthrodesis status: Secondary | ICD-10-CM | POA: Diagnosis not present

## 2018-11-23 DIAGNOSIS — M5020 Other cervical disc displacement, unspecified cervical region: Secondary | ICD-10-CM | POA: Diagnosis not present

## 2019-01-04 DIAGNOSIS — M50322 Other cervical disc degeneration at C5-C6 level: Secondary | ICD-10-CM | POA: Diagnosis not present

## 2019-01-04 DIAGNOSIS — Z981 Arthrodesis status: Secondary | ICD-10-CM | POA: Diagnosis not present

## 2019-01-04 DIAGNOSIS — M5020 Other cervical disc displacement, unspecified cervical region: Secondary | ICD-10-CM | POA: Diagnosis not present

## 2019-01-18 DIAGNOSIS — M5020 Other cervical disc displacement, unspecified cervical region: Secondary | ICD-10-CM | POA: Diagnosis not present

## 2019-01-18 DIAGNOSIS — M2578 Osteophyte, vertebrae: Secondary | ICD-10-CM | POA: Diagnosis not present

## 2019-01-18 DIAGNOSIS — Z981 Arthrodesis status: Secondary | ICD-10-CM | POA: Diagnosis not present

## 2019-01-18 DIAGNOSIS — M50322 Other cervical disc degeneration at C5-C6 level: Secondary | ICD-10-CM | POA: Diagnosis not present

## 2019-01-19 DIAGNOSIS — M5020 Other cervical disc displacement, unspecified cervical region: Secondary | ICD-10-CM | POA: Diagnosis not present

## 2019-01-31 DIAGNOSIS — Z20828 Contact with and (suspected) exposure to other viral communicable diseases: Secondary | ICD-10-CM | POA: Diagnosis not present

## 2019-02-14 ENCOUNTER — Other Ambulatory Visit: Payer: Self-pay | Admitting: Family Medicine

## 2019-02-15 NOTE — Telephone Encounter (Signed)
Must make appointment 

## 2019-02-28 DIAGNOSIS — M5416 Radiculopathy, lumbar region: Secondary | ICD-10-CM | POA: Diagnosis not present

## 2019-02-28 DIAGNOSIS — M9902 Segmental and somatic dysfunction of thoracic region: Secondary | ICD-10-CM | POA: Diagnosis not present

## 2019-02-28 DIAGNOSIS — M9903 Segmental and somatic dysfunction of lumbar region: Secondary | ICD-10-CM | POA: Diagnosis not present

## 2019-02-28 DIAGNOSIS — M4727 Other spondylosis with radiculopathy, lumbosacral region: Secondary | ICD-10-CM | POA: Diagnosis not present

## 2019-03-02 ENCOUNTER — Other Ambulatory Visit: Payer: Self-pay | Admitting: Sports Medicine

## 2019-03-02 MED ORDER — BUPROPION HCL ER (XL) 150 MG PO TB24: 450.0000 mg | ORAL_TABLET | Freq: Every day | ORAL | 1 refills | Status: DC

## 2019-03-04 ENCOUNTER — Other Ambulatory Visit: Payer: Self-pay | Admitting: Family Medicine

## 2019-03-04 DIAGNOSIS — F411 Generalized anxiety disorder: Secondary | ICD-10-CM

## 2019-04-05 DIAGNOSIS — M5030 Other cervical disc degeneration, unspecified cervical region: Secondary | ICD-10-CM | POA: Diagnosis not present

## 2019-04-05 DIAGNOSIS — M5020 Other cervical disc displacement, unspecified cervical region: Secondary | ICD-10-CM | POA: Diagnosis not present

## 2019-04-05 DIAGNOSIS — M5416 Radiculopathy, lumbar region: Secondary | ICD-10-CM | POA: Diagnosis not present

## 2019-04-05 DIAGNOSIS — Z981 Arthrodesis status: Secondary | ICD-10-CM | POA: Diagnosis not present

## 2019-04-15 ENCOUNTER — Other Ambulatory Visit: Payer: Self-pay | Admitting: Osteopathic Medicine

## 2019-04-15 NOTE — Telephone Encounter (Signed)
Please review for refill- patient at [facility] 

## 2019-04-15 NOTE — Telephone Encounter (Signed)
Must make appointment and labs

## 2019-05-15 ENCOUNTER — Other Ambulatory Visit: Payer: Self-pay | Admitting: Sports Medicine

## 2019-06-24 ENCOUNTER — Ambulatory Visit (INDEPENDENT_AMBULATORY_CARE_PROVIDER_SITE_OTHER): Payer: BC Managed Care – PPO | Admitting: Family Medicine

## 2019-06-24 ENCOUNTER — Encounter: Payer: Self-pay | Admitting: Family Medicine

## 2019-06-24 ENCOUNTER — Other Ambulatory Visit: Payer: Self-pay

## 2019-06-24 VITALS — BP 138/86 | HR 78 | Temp 98.5°F | Ht 75.2 in | Wt >= 6400 oz

## 2019-06-24 DIAGNOSIS — I10 Essential (primary) hypertension: Secondary | ICD-10-CM

## 2019-06-24 DIAGNOSIS — E349 Endocrine disorder, unspecified: Secondary | ICD-10-CM

## 2019-06-24 DIAGNOSIS — M5412 Radiculopathy, cervical region: Secondary | ICD-10-CM

## 2019-06-24 DIAGNOSIS — Z13220 Encounter for screening for lipoid disorders: Secondary | ICD-10-CM | POA: Diagnosis not present

## 2019-06-24 DIAGNOSIS — E890 Postprocedural hypothyroidism: Secondary | ICD-10-CM

## 2019-06-24 DIAGNOSIS — G4733 Obstructive sleep apnea (adult) (pediatric): Secondary | ICD-10-CM

## 2019-06-24 DIAGNOSIS — F330 Major depressive disorder, recurrent, mild: Secondary | ICD-10-CM

## 2019-06-24 MED ORDER — VALSARTAN 160 MG PO TABS
160.0000 mg | ORAL_TABLET | Freq: Every day | ORAL | 1 refills | Status: DC
Start: 2019-06-24 — End: 2019-09-02

## 2019-06-24 NOTE — Assessment & Plan Note (Signed)
Blood pressure is at goal at for age and co-morbidities.  I recommend he continue on valsartan rather than losartan.  Rx renewed.  In addition they were instructed to follow a low sodium diet with regular exercise to help to maintain adequate control of blood pressure.

## 2019-06-24 NOTE — Assessment & Plan Note (Signed)
Reports some increased fatigue and not sleeping as well. He has put on weight over the past year.  Will see if we can find previous settings and make adjustment.

## 2019-06-24 NOTE — Assessment & Plan Note (Signed)
Stable with current medications.  Continue at current dose and strength.

## 2019-06-24 NOTE — Assessment & Plan Note (Signed)
Update TSH today.  Referral to endocrine per patient request.

## 2019-06-24 NOTE — Patient Instructions (Signed)
Great to meet you today! Have labs completed today.  Continue current medications I have entered referral to endocrinology, you should be contacted for this appt soon.  You have valsartan and losartan on your med list.  I want you to only take valsartan.   See me again in 6 months.

## 2019-06-24 NOTE — Assessment & Plan Note (Signed)
Update testosterone levels.

## 2019-06-24 NOTE — Assessment & Plan Note (Signed)
S/p ACDF, doing well at this time.

## 2019-06-24 NOTE — Progress Notes (Signed)
Tyler Hancock - 39 y.o. male MRN [id number]  Date of birth: [birth year]  Subjective Chief Complaint  Patient presents with  . Referral    Endocrinology  . Medication Refill    HPI Tyler Hancock is a 39 y.o. male with history of HTN, hypothyroidism, OSA, anxiety, and hypogonadism here today for follow up.    -HTN: He has both losartan and valsartan on medication list.  He thinks he is taking losartan.  He denies side effects with medication.  He has not had any symptoms including chest pain, shortness of breath, palpitations, increased headaches or vision changes  -Hypothyroidism:  Current treatment with levothyroxine 90mcg daily.  Due for TSH check.  He is also requesting referral back to endocrinology for management of this and low testosterone.  He does report some increased fatigue and weight is up 13lbs since last year.     - OSA:  He is using CPAP but doesn't feel like it is quite as effective as it had been in the past.    -Low testosterone:  Has been taking injections intermittently.  Interested in possibly restarting.  Denies side effect previously.     -Depression:  Treated with bupropion, sertraline and buspar.  Stable at this time.  Admits to increased stress related to job   ROS:  A comprehensive ROS was completed and negative except as noted per HPI  Allergies  Allergen Reactions  . Nickel Other (See Comments)    Skin irritation    Past Medical History:  Diagnosis Date  . Chronic sinus complaints   . 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 GAD (generalized anxiety disorder) 12/19/2015  . GERD (gastroesophageal reflux disease)   . Graves disease   . Hypertension   . Hypotestosteronism 06/13/2014  . Major depression 06/13/2014  . Migraine variant   . Morbid obesity ([place]) 06/13/2014  . OSA (obstructive sleep apnea)   . Thyrotoxic crisis    hx of    Past Surgical History:  Procedure Laterality Date  . ADENOIDECTOMY    . ANTERIOR CERVICAL DECOMP/DISCECTOMY  FUSION  10/06/2018   C6-C7  . REMOVAL OF TISSUE BILATERAL EUSTACHIAN TUBE DILATION ; Surgeon: Ileene Rubens, MD; Location: [facility]; Service  08/30/2014  . SEPTOPLASTY, BILATERAL ENDOSCOPIC MIDDLE MEATAL ANTROSTOMY  08/30/2014  . TONGUE SURGERY      Social History   Socioeconomic History  . Marital status: Married    Spouse name: Not on file  . Number of children: Not on file  . Years of education: Not on file  . Highest education level: Not on file  Occupational History  . Not on file  Tobacco Use  . Smoking status: Never Smoker  . Smokeless tobacco: Never Used  Substance and Sexual Activity  . Alcohol use: Yes    Alcohol/week: 0.0 standard drinks  . Drug use: No  . Sexual activity: Yes  Other Topics Concern  . Not on file  Social History Narrative  . Not on file   Social Determinants of Health   Financial Resource Strain:   . Difficulty of Paying Living Expenses:   Food Insecurity:   . Worried About Charity fundraiser in the Last Year:   . Arboriculturist in the Last Year:   Transportation Needs:   . Film/video editor (Medical):   Marland Kitchen Lack of Transportation (Non-Medical):   Physical Activity:   . Days of Exercise per Week:   . Minutes of Exercise per Session:   Stress:   .  Feeling of Stress :   Social Connections:   . Frequency of Communication with Friends and Family:   . Frequency of Social Gatherings with Friends and Family:   . Attends Religious Services:   . Active Member of Clubs or Organizations:   . Attends Archivist Meetings:   Marland Kitchen Marital Status:     Family History  Problem Relation Age of Onset  . Schizophrenia Maternal Grandfather   . Colon cancer Maternal Grandfather   . Early death Maternal Grandfather   . Diabetes Father   . Colon cancer Paternal Uncle   . Early death Paternal Uncle   . Depression Mother   . Stroke Paternal Grandmother     Health Maintenance  Topic Date Due  . HIV Screening  Never done  . INFLUENZA  VACCINE  09/11/2019  . TETANUS/TDAP  05/31/2028  . COVID-19 Vaccine  Completed     ----------------------------------------------------------------------------------------------------------------------------------------------------------------------------------------------------------------- Physical Exam BP 138/86 Comment: manual  Pulse 78   Temp 98.5 F (36.9 C) (Oral)   Ht 6' 3.2" (1.91 m)   Wt (!) 402 lb 1.6 oz (182.4 kg)   SpO2 96%   BMI 50.00 kg/m   Physical Exam Constitutional:      Appearance: Normal appearance.  HENT:     Head: Normocephalic and atraumatic.  Eyes:     General: No scleral icterus. Cardiovascular:     Rate and Rhythm: Normal rate and regular rhythm.  Pulmonary:     Effort: Pulmonary effort is normal.     Breath sounds: Normal breath sounds.  Musculoskeletal:     Cervical back: Neck supple.  Skin:    General: Skin is warm and dry.  Neurological:     General: No focal deficit present.     Mental Status: He is alert.  Psychiatric:        Mood and Affect: Mood normal.        Behavior: Behavior normal.     ------------------------------------------------------------------------------------------------------------------------------------------------------------------------------------------------------------------- Assessment and Plan  Essential (primary) hypertension Blood pressure is at goal at for age and co-morbidities.  I recommend he continue on valsartan rather than losartan.  Rx renewed.  In addition they were instructed to follow a low sodium diet with regular exercise to help to maintain adequate control of blood pressure.    OSA (obstructive sleep apnea) Reports some increased fatigue and not sleeping as well. He has put on weight over the past year.  Will see if we can find previous settings and make adjustment.   Hypothyroid Update TSH today.  Referral to endocrine per patient request.   Cervical radiculopathy at C7 S/p ACDF,  doing well at this time.   Major depression Stable with current medications.  Continue at current dose and strength.   Hypotestosteronism Update testosterone levels.    Meds ordered this encounter  Medications  . valsartan (DIOVAN) 160 MG tablet    Sig: Take 1 tablet (160 mg total) by mouth daily.    Dispense:  90 tablet    Refill:  1    Return in about 6 months (around 12/25/2019) for HTN.    This visit occurred during the SARS-CoV-2 public health emergency.  Safety protocols were in place, including screening questions prior to the visit, additional usage of staff PPE, and extensive cleaning of exam room while observing appropriate contact time as indicated for disinfecting solutions.

## 2019-06-25 LAB — COMPLETE METABOLIC PANEL WITH GFR
AG Ratio: 1.6 (calc) (ref 1.0–2.5)
ALT: 42 U/L (ref 9–46)
AST: 26 U/L (ref 10–40)
Albumin: 4.6 g/dL (ref 3.6–5.1)
Alkaline phosphatase (APISO): 61 U/L (ref 36–130)
BUN: 15 mg/dL (ref 7–25)
CO2: 32 mmol/L (ref 20–32)
Calcium: 9.9 mg/dL (ref 8.6–10.3)
Chloride: 101 mmol/L (ref 98–110)
Creat: 1.05 mg/dL (ref 0.60–1.35)
GFR, Est African American: 105 mL/min/{1.73_m2} (ref >=60)
GFR, Est Non African American: 91 mL/min/{1.73_m2} (ref >=60)
Globulin: 2.8 g/dL (calc) (ref 1.9–3.7)
Glucose, Bld: 89 mg/dL (ref 65–99)
Potassium: 4.5 mmol/L (ref 3.5–5.3)
Sodium: 138 mmol/L (ref 135–146)
Total Bilirubin: 0.7 mg/dL (ref 0.2–1.2)
Total Protein: 7.4 g/dL (ref 6.1–8.1)

## 2019-06-25 LAB — CBC
HCT: 49.5 % (ref 38.5–50.0)
Hemoglobin: 17 g/dL (ref 13.2–17.1)
MCH: 31.7 pg (ref 27.0–33.0)
MCHC: 34.3 g/dL (ref 32.0–36.0)
MCV: 92.2 fL (ref 80.0–100.0)
MPV: 11.5 fL (ref 7.5–12.5)
Platelets: 223 10*3/uL (ref 140–400)
RBC: 5.37 10*6/uL (ref 4.20–5.80)
RDW: 12.5 % (ref 11.0–15.0)
WBC: 7.2 10*3/uL (ref 3.8–10.8)

## 2019-06-25 LAB — TESTOSTERONE: Testosterone: 368 ng/dL (ref 250–827)

## 2019-06-25 LAB — TSH: TSH: 24.98 mIU/L — ABNORMAL HIGH (ref 0.40–4.50)

## 2019-06-27 ENCOUNTER — Other Ambulatory Visit: Payer: Self-pay | Admitting: Family Medicine

## 2019-06-27 ENCOUNTER — Encounter: Payer: Self-pay | Admitting: Family Medicine

## 2019-06-28 ENCOUNTER — Ambulatory Visit: Payer: BC Managed Care – PPO | Admitting: Internal Medicine

## 2019-06-28 ENCOUNTER — Other Ambulatory Visit: Payer: Self-pay | Admitting: Family Medicine

## 2019-06-28 MED ORDER — SYNTHROID 200 MCG PO TABS: 200.0000 ug | ORAL_TABLET | Freq: Every day | ORAL | 0 refills | Status: DC

## 2019-06-28 MED ORDER — LEVOTHYROXINE SODIUM 100 MCG PO TABS: 100.0000 ug | ORAL_TABLET | Freq: Every day | ORAL | 0 refills | Status: DC

## 2019-06-28 MED ORDER — LEVOTHYROXINE SODIUM 200 MCG PO TABS: 200.0000 ug | ORAL_TABLET | Freq: Every day | ORAL | 0 refills | Status: DC

## 2019-06-28 MED ORDER — SYNTHROID 100 MCG PO TABS: 100.0000 ug | ORAL_TABLET | Freq: Every day | ORAL | 0 refills | Status: DC

## 2019-07-10 ENCOUNTER — Other Ambulatory Visit: Payer: Self-pay | Admitting: Family Medicine

## 2019-07-26 DIAGNOSIS — E890 Postprocedural hypothyroidism: Secondary | ICD-10-CM | POA: Diagnosis not present

## 2019-07-26 DIAGNOSIS — Z6841 Body Mass Index (BMI) 40.0 and over, adult: Secondary | ICD-10-CM | POA: Diagnosis not present

## 2019-07-26 DIAGNOSIS — E291 Testicular hypofunction: Secondary | ICD-10-CM | POA: Diagnosis not present

## 2019-08-31 ENCOUNTER — Encounter: Payer: Self-pay | Admitting: Family Medicine

## 2019-09-02 ENCOUNTER — Other Ambulatory Visit: Payer: Self-pay

## 2019-09-02 MED ORDER — SERTRALINE HCL 100 MG PO TABS: ORAL_TABLET | ORAL | 1 refills | Status: DC

## 2019-09-02 MED ORDER — BUPROPION HCL ER (XL) 150 MG PO TB24: ORAL_TABLET | ORAL | 1 refills | Status: DC

## 2019-09-02 MED ORDER — GABAPENTIN 300 MG PO CAPS: ORAL_CAPSULE | ORAL | 3 refills | Status: DC

## 2019-09-02 MED ORDER — VALSARTAN 160 MG PO TABS: 160.0000 mg | ORAL_TABLET | Freq: Every day | ORAL | 1 refills | Status: DC

## 2019-10-03 MED ORDER — VALSARTAN 160 MG PO TABS: 160.0000 mg | ORAL_TABLET | Freq: Every day | ORAL | 1 refills | Status: DC

## 2019-10-03 MED ORDER — GABAPENTIN 300 MG PO CAPS: ORAL_CAPSULE | ORAL | 3 refills | Status: DC

## 2019-10-03 MED ORDER — SERTRALINE HCL 100 MG PO TABS: ORAL_TABLET | ORAL | 1 refills | Status: DC

## 2019-10-03 MED ORDER — BUPROPION HCL ER (XL) 150 MG PO TB24: ORAL_TABLET | ORAL | 1 refills | Status: DC

## 2019-10-03 NOTE — Telephone Encounter (Signed)
Per previous MyChart msg patient changed pharmacies, I have re-sent RX to correct pharmacy. Patient aware

## 2019-10-03 NOTE — Addendum Note (Signed)
Addended by: Towana Badger on: 10/03/2019 10:45 AM   Modules accepted: Orders

## 2019-10-10 ENCOUNTER — Telehealth: Payer: Self-pay | Admitting: Family Medicine

## 2019-10-10 NOTE — Telephone Encounter (Signed)
Received PA for Bupropion sent through cover my meds waiting on determination. - CF

## 2019-10-11 DIAGNOSIS — Z981 Arthrodesis status: Secondary | ICD-10-CM | POA: Diagnosis not present

## 2019-10-11 DIAGNOSIS — M542 Cervicalgia: Secondary | ICD-10-CM | POA: Diagnosis not present

## 2019-10-11 DIAGNOSIS — M4802 Spinal stenosis, cervical region: Secondary | ICD-10-CM | POA: Diagnosis not present

## 2019-10-11 DIAGNOSIS — M5030 Other cervical disc degeneration, unspecified cervical region: Secondary | ICD-10-CM | POA: Diagnosis not present

## 2019-10-14 ENCOUNTER — Other Ambulatory Visit: Payer: Self-pay | Admitting: Family Medicine

## 2019-10-28 DIAGNOSIS — Z6841 Body Mass Index (BMI) 40.0 and over, adult: Secondary | ICD-10-CM | POA: Diagnosis not present

## 2019-10-28 DIAGNOSIS — E291 Testicular hypofunction: Secondary | ICD-10-CM | POA: Diagnosis not present

## 2019-10-28 DIAGNOSIS — E890 Postprocedural hypothyroidism: Secondary | ICD-10-CM | POA: Diagnosis not present

## 2019-10-31 DIAGNOSIS — M5030 Other cervical disc degeneration, unspecified cervical region: Secondary | ICD-10-CM | POA: Diagnosis not present

## 2019-10-31 DIAGNOSIS — M4802 Spinal stenosis, cervical region: Secondary | ICD-10-CM | POA: Diagnosis not present

## 2019-10-31 DIAGNOSIS — M4722 Other spondylosis with radiculopathy, cervical region: Secondary | ICD-10-CM | POA: Diagnosis not present

## 2019-11-04 NOTE — Telephone Encounter (Signed)
Checked on cover my meds for determination and received approval. Valid 10/05/19 - 10/04/20. - CF

## 2019-11-30 ENCOUNTER — Other Ambulatory Visit: Payer: Self-pay

## 2019-11-30 DIAGNOSIS — F411 Generalized anxiety disorder: Secondary | ICD-10-CM

## 2019-11-30 MED ORDER — SERTRALINE HCL 100 MG PO TABS: ORAL_TABLET | ORAL | 1 refills | Status: DC

## 2019-11-30 MED ORDER — BUPROPION HCL ER (XL) 150 MG PO TB24: ORAL_TABLET | ORAL | 1 refills | Status: DC

## 2019-11-30 MED ORDER — VALSARTAN 160 MG PO TABS: 160.0000 mg | ORAL_TABLET | Freq: Every day | ORAL | 1 refills | Status: DC

## 2019-11-30 MED ORDER — BUSPIRONE HCL 10 MG PO TABS: 10.0000 mg | ORAL_TABLET | Freq: Every day | ORAL | 1 refills | Status: DC | PRN

## 2019-11-30 NOTE — Telephone Encounter (Signed)
Tyler Hancock needs medication sent to [facility] in [place]. He is having trouble with Mail order. Buspar never filled by Dr Zigmund Daniel.

## 2019-12-26 ENCOUNTER — Encounter: Payer: Self-pay | Admitting: Family Medicine

## 2019-12-26 ENCOUNTER — Ambulatory Visit (INDEPENDENT_AMBULATORY_CARE_PROVIDER_SITE_OTHER): Payer: BLUE CROSS/BLUE SHIELD | Admitting: Family Medicine

## 2019-12-26 VITALS — BP 146/87 | HR 70 | Temp 97.8°F | Wt >= 6400 oz

## 2019-12-26 DIAGNOSIS — I10 Essential (primary) hypertension: Secondary | ICD-10-CM | POA: Diagnosis not present

## 2019-12-26 DIAGNOSIS — E890 Postprocedural hypothyroidism: Secondary | ICD-10-CM | POA: Diagnosis not present

## 2019-12-26 DIAGNOSIS — F330 Major depressive disorder, recurrent, mild: Secondary | ICD-10-CM

## 2019-12-26 DIAGNOSIS — J0100 Acute maxillary sinusitis, unspecified: Secondary | ICD-10-CM

## 2019-12-26 MED ORDER — SEMAGLUTIDE-WEIGHT MANAGEMENT 0.25 MG/0.5ML ~~LOC~~ SOAJ: 0.2500 mg | SUBCUTANEOUS | 0 refills | Status: AC

## 2019-12-26 MED ORDER — SEMAGLUTIDE-WEIGHT MANAGEMENT 0.5 MG/0.5ML ~~LOC~~ SOAJ: 0.5000 mg | SUBCUTANEOUS | 0 refills | Status: AC

## 2019-12-26 MED ORDER — DOXYCYCLINE HYCLATE 100 MG PO TABS: 100.0000 mg | ORAL_TABLET | Freq: Two times a day (BID) | ORAL | 0 refills | Status: DC

## 2019-12-26 MED ORDER — SEMAGLUTIDE-WEIGHT MANAGEMENT 1 MG/0.5ML ~~LOC~~ SOAJ: 1.0000 mg | SUBCUTANEOUS | 0 refills | Status: AC

## 2019-12-26 MED ORDER — VALSARTAN 320 MG PO TABS
320.0000 mg | ORAL_TABLET | Freq: Every day | ORAL | 2 refills | Status: DC
Start: 2019-12-26 — End: 2020-11-27

## 2019-12-26 NOTE — Patient Instructions (Signed)
Great to see you today! Start doxycycline for sinus infection.  Increase valsartan to 320mg .  Let's try Wegovy-Follow up with me in 8 weeks.

## 2019-12-26 NOTE — Assessment & Plan Note (Signed)
He is doing fairly well with current medications.  Wants to consider coming off some medications but prefers to do this under supervision of psychiatrist.  Referral entered.

## 2019-12-26 NOTE — Progress Notes (Signed)
Tyler Hancock - 39 y.o. male MRN [id number]  Date of birth: Jul 23, 1980  Subjective Chief Complaint  Patient presents with  . Nasal Congestion    HPI Tyler Hancock is a 39 y.o. male here today for f/u of HTN and depression.  He has also had some sinus issues.   HTN currently managed with valsartan 160mg  daily.  He reports that he is tolerating this well.  BP remains elevated.  Weight is up about 5 lbs since his last visit.  He has had difficulty with weight loss and tried several different diets.  He has also tried  contrave previously but didn't have much success with this.  He would be interested in other medications available for weight loss.    He is doing pretty well with combination of sertraline, bupropion and buspar.  He would like referral to psychiatry for continued medication management.    He has had sinus pain and pressure for the past week.  Pain in upper teeth with thick nasal drainage.  Denies fever, chills, cough, shortness of breath,.   He continues to see endocrinology for hypothyroidism and low testosterone.   ROS:  A comprehensive ROS was completed and negative except as noted per HPI  Allergies  Allergen Reactions  . Nickel Other (See Comments)    Skin irritation    Past Medical History:  Diagnosis Date  . Chronic sinus complaints   . 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 GAD (generalized anxiety disorder) 12/19/2015  . GERD (gastroesophageal reflux disease)   . Graves disease   . Hypertension   . Hypotestosteronism 06/13/2014  . Major depression 06/13/2014  . Migraine variant   . Morbid obesity ([place]) 06/13/2014  . OSA (obstructive sleep apnea)   . Thyrotoxic crisis    hx of    Past Surgical History:  Procedure Laterality Date  . ADENOIDECTOMY    . ANTERIOR CERVICAL DECOMP/DISCECTOMY FUSION  10/06/2018   C6-C7  . REMOVAL OF TISSUE BILATERAL EUSTACHIAN TUBE DILATION ; Surgeon: Ileene Rubens, MD; Location: [facility]; Service  08/30/2014  .  SEPTOPLASTY, BILATERAL ENDOSCOPIC MIDDLE MEATAL ANTROSTOMY  08/30/2014  . TONGUE SURGERY      Social History   Socioeconomic History  . Marital status: Married    Spouse name: Not on file  . Number of children: Not on file  . Years of education: Not on file  . Highest education level: Not on file  Occupational History  . Not on file  Tobacco Use  . Smoking status: Never Smoker  . Smokeless tobacco: Never Used  Substance and Sexual Activity  . Alcohol use: Yes    Alcohol/week: 0.0 standard drinks  . Drug use: No  . Sexual activity: Yes  Other Topics Concern  . Not on file  Social History Narrative  . Not on file   Social Determinants of Health   Financial Resource Strain:   . Difficulty of Paying Living Expenses: Not on file  Food Insecurity:   . Worried About Charity fundraiser in the Last Year: Not on file  . Ran Out of Food in the Last Year: Not on file  Transportation Needs:   . Lack of Transportation (Medical): Not on file  . Lack of Transportation (Non-Medical): Not on file  Physical Activity:   . Days of Exercise per Week: Not on file  . Minutes of Exercise per Session: Not on file  Stress:   . Feeling of Stress : Not on file  Social Connections:   .  Frequency of Communication with Friends and Family: Not on file  . Frequency of Social Gatherings with Friends and Family: Not on file  . Attends Religious Services: Not on file  . Active Member of Clubs or Organizations: Not on file  . Attends Archivist Meetings: Not on file  . Marital Status: Not on file    Family History  Problem Relation Age of Onset  . Schizophrenia Maternal Grandfather   . Colon cancer Maternal Grandfather   . Early death Maternal Grandfather   . Diabetes Father   . Colon cancer Paternal Uncle   . Early death Paternal Uncle   . Depression Mother   . Stroke Paternal Grandmother     Health Maintenance  Topic Date Due  . Hepatitis C Screening  Never done  . HIV  Screening  Never done  . INFLUENZA VACCINE  09/11/2019  . TETANUS/TDAP  05/31/2028  . COVID-19 Vaccine  Completed     ----------------------------------------------------------------------------------------------------------------------------------------------------------------------------------------------------------------- Physical Exam BP (!) 146/87 (BP Location: Left Arm, Patient Position: Sitting, Cuff Size: Large)   Pulse 70   Temp 97.8 F (36.6 C) (Oral)   Wt (!) 407 lb 4.8 oz (184.8 kg)   SpO2 97%   BMI 50.64 kg/m   Physical Exam Constitutional:      Appearance: He is obese.  HENT:     Right Ear: Tympanic membrane normal.     Left Ear: Tympanic membrane normal.     Nose:     Comments: L maxillary sinus tenderness  Eyes:     General: No scleral icterus. Cardiovascular:     Rate and Rhythm: Normal rate and regular rhythm.  Musculoskeletal:     Cervical back: Neck supple. No rigidity.  Skin:    General: Skin is warm and dry.  Neurological:     General: No focal deficit present.     Mental Status: He is alert.     ------------------------------------------------------------------------------------------------------------------------------------------------------------------------------------------------------------------- Assessment and Plan  Essential (primary) hypertension BP elevated.  Discussed weight loss.  Increase valsartan to 360mg  daily.    Morbid obesity ([place]) Weight loss recommended.  Will start wegovy with titration over the next few months.  Given discount coupon.  Will plan for f/u in 6-8 weeks.   Major depression He is doing fairly well with current medications.  Wants to consider coming off some medications but prefers to do this under supervision of psychiatrist.  Referral entered.    Hypothyroid Followed by endocrinology.   Acute maxillary sinusitis Start doxycycline 100mg  BID.  Increased fluids and rest recommended.     Meds  ordered this encounter  Medications  . doxycycline (VIBRA-TABS) 100 MG tablet    Sig: Take 1 tablet (100 mg total) by mouth 2 (two) times daily.    Dispense:  20 tablet    Refill:  0  . Semaglutide-Weight Management 0.25 MG/0.5ML SOAJ    Sig: Inject 0.25 mg into the skin once a week for 28 days.    Dispense:  2 mL    Refill:  0  . Semaglutide-Weight Management 0.5 MG/0.5ML SOAJ    Sig: Inject 0.5 mg into the skin once a week for 28 days.    Dispense:  2 mL    Refill:  0  . Semaglutide-Weight Management 1 MG/0.5ML SOAJ    Sig: Inject 1 mg into the skin once a week for 28 days.    Dispense:  2 mL    Refill:  0  . valsartan (DIOVAN) 320 MG tablet  Sig: Take 1 tablet (320 mg total) by mouth daily.    Dispense:  90 tablet    Refill:  2   Orders Placed This Encounter  Procedures  . Ambulatory referral to Psychiatry    Referral Priority:   Routine    Referral Type:   Psychiatric    Referral Reason:   Specialty Services Required    Requested Specialty:   Psychiatry    Number of Visits Requested:   1     No follow-ups on file.    This visit occurred during the SARS-CoV-2 public health emergency.  Safety protocols were in place, including screening questions prior to the visit, additional usage of staff PPE, and extensive cleaning of exam room while observing appropriate contact time as indicated for disinfecting solutions.

## 2019-12-26 NOTE — Assessment & Plan Note (Signed)
BP elevated.  Discussed weight loss.  Increase valsartan to 360mg  daily.

## 2019-12-26 NOTE — Assessment & Plan Note (Signed)
Weight loss recommended.  Will start wegovy with titration over the next few months.  Given discount coupon.  Will plan for f/u in 6-8 weeks.

## 2019-12-26 NOTE — Assessment & Plan Note (Signed)
Start doxycycline 100mg  BID.  Increased fluids and rest recommended.

## 2019-12-26 NOTE — Assessment & Plan Note (Signed)
Followed by endocrinology 

## 2020-02-15 DIAGNOSIS — Z03818 Encounter for observation for suspected exposure to other biological agents ruled out: Secondary | ICD-10-CM | POA: Diagnosis not present

## 2020-02-15 DIAGNOSIS — R5383 Other fatigue: Secondary | ICD-10-CM | POA: Diagnosis not present

## 2020-02-15 DIAGNOSIS — R52 Pain, unspecified: Secondary | ICD-10-CM | POA: Diagnosis not present

## 2020-02-15 DIAGNOSIS — R519 Headache, unspecified: Secondary | ICD-10-CM | POA: Diagnosis not present

## 2020-02-20 ENCOUNTER — Ambulatory Visit: Payer: BLUE CROSS/BLUE SHIELD | Admitting: Family Medicine

## 2020-04-02 ENCOUNTER — Ambulatory Visit (INDEPENDENT_AMBULATORY_CARE_PROVIDER_SITE_OTHER): Payer: BC Managed Care – PPO | Admitting: Licensed Clinical Social Worker

## 2020-04-02 DIAGNOSIS — F331 Major depressive disorder, recurrent, moderate: Secondary | ICD-10-CM

## 2020-04-03 NOTE — Progress Notes (Signed)
Virtual Visit via Video Note  I connected with Tyler Hancock on 04/03/20 at  2:00 PM EST by a video enabled telemedicine application and verified that I am speaking with the correct person using two identifiers.  Location: Patient: Home Provider: Office   I discussed the limitations of evaluation and management by telemedicine and the availability of in person appointments. The patient expressed understanding and agreed to proceed.  Comprehensive Clinical Assessment (CCA) Note  04/03/2020 Tyler Hancock [id number]  Chief Complaint:  Chief Complaint  Patient presents with  . Anxiety  . Depression   Visit Diagnosis: Major depressive disorder, recurrent episode, moderate with anxious distress (HCC)   CCA Biopsychosocial Intake/Chief Complaint:  Feels disconnected, feels numb, Mood, Anxiety  Current Symptoms/Problems: Mood: feels disconnected, feels numb, feels "blah," can get hyperfocused/tunnel vision, low energy, difficulty with concentration, fatigue at times, appetite flucuates, mild irritability, some difficulty "committing" to sleep, some feelings of hopelessness, Anxiety: financial stress, changed jobs, can get overwhelmed,   has some back pain, and body aches   Patient Reported Schizophrenia/Schizoaffective Diagnosis in Past: No   Strengths: tends to be focused/aware, tries to understand/problem solve,  Preferences: doesn't prefer large crowds, prefers one on one interactions, prefers to do his own thing/time to himself at times  Abilities: problem solving, design, see things in his mind before creating, woodworking, focused driver   Type of Services Patient Feels are Needed: Therapy, medication   Initial Clinical Notes/Concerns: Symptoms started in childhood when his parents were seperated when he was younger (he doesn't remember it) but increased during COVID, symptoms occur 5 out of 7 days of the week, symptoms are mild per patient   Mental Health  Symptoms Depression:  Change in energy/activity; Difficulty Concentrating; Fatigue; Irritability; Sleep (too much or little); Hopelessness   Duration of Depressive symptoms: Greater than two weeks   Mania:  None   Anxiety:   Difficulty concentrating; Worrying   Psychosis:  None   Duration of Psychotic symptoms: No data recorded  Trauma:  None   Obsessions:  None   Compulsions:  None   Inattention:  None   Hyperactivity/Impulsivity:  No data recorded  Oppositional/Defiant Behaviors:  None   Emotional Irregularity:  None   Other Mood/Personality Symptoms:  N/A    Mental Status Exam Appearance and self-care  Stature:  Average   Weight:  Overweight   Clothing:  Casual   Grooming:  Normal   Cosmetic use:  None   Posture/gait:  Normal   Motor activity:  Not Remarkable   Sensorium  Attention:  Normal   Concentration:  Normal   Orientation:  X5   Recall/memory:  Normal   Affect and Mood  Affect:  Anxious   Mood:  Anxious   Relating  Eye contact:  Normal   Facial expression:  Anxious   Attitude toward examiner:  Cooperative   Thought and Language  Speech flow: Normal   Thought content:  Appropriate to Mood and Circumstances   Preoccupation:  None   Hallucinations:  None   Organization:  No data recorded  [employer] of Knowledge:  Good   Intelligence:  Average   Abstraction:  Normal   Judgement:  Normal   Reality Testing:  Adequate   Insight:  Good   Decision Making:  Normal   Social Functioning  Social Maturity:  Isolates   Social Judgement:  Normal   Stress  Stressors:  Transitions; Work; Teacher, music Ability:  Overwhelmed  Skill Deficits:  None   Supports:  Family     Religion: Religion/Spirituality Are You A Religious Person?: Yes What is Your Religious Affiliation?: Darrick Meigs (Not active in organized religion) How Might This Affect Treatment?: Support in  treatment  Leisure/Recreation: Leisure / Recreation Do You Have Hobbies?: Yes Leisure and Hobbies: 3d printing, ax throwing  Exercise/Diet: Exercise/Diet Do You Exercise?: No Have You Gained or Lost A Significant Amount of Weight in the Past Six Months?: No Do You Follow a Special Diet?: No Do You Have Any Trouble Sleeping?: Yes Explanation of Sleeping Difficulties: Sometimes anxiety interferes   CCA Employment/Education Employment/Work Situation: Employment / Work Situation Employment situation: Employed Where is patient currently employed?: [employer] long has patient been employed?: Since April 2021 worked with them previously Patient's job has been impacted by current illness: No What is the longest time patient has a held a job?: 8 years 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 Has patient ever been in the [employer]?: No  Education: Education Is Patient Currently Attending School?: No Last Grade Completed: 12 Name of High School: [employer] Did Teacher, adult education From [employer]?: Yes Did Physicist, medical?: Yes What Type of College 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?: Manufacturing engineer of [employer] Did Heritage manager?: No 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) Did You Have Any Special Interests In School?: Science, math Did You Have An Individualized Education Program (IIEP): No Did You Have Any Difficulty At [employer]?: No Patient's Education Has Been Impacted by Current Illness: No   CCA Family/Childhood History Family and Relationship History: Family history Marital status: Married Number of Years Married: 31 What types of issues is patient dealing with in the relationship?: A degree of apathy Additional relationship information: None Are you sexually active?: No What is your sexual orientation?: Heterosexual Has your sexual activity been affected by drugs, alcohol, medication, or emotional stress?: None Does patient have  children?: Yes How many children?: 1 How is patient's relationship with their children?: Daughter, close  Childhood History:  Childhood History By whom was/is the patient raised?: Both parents Additional childhood history information: Both parents were in the home. Father was the primary caretaker. Patient describes childhood as "normalish..." Description of patient's relationship with caregiver when they were a child: Mother: not as close,   Father: close Patient's description of current relationship with people who raised him/her: Mother: good,    Father: close, good relationship How were you disciplined when you got in trouble as a child/adolescent?: things taken away, spanked, mouth washed out with soap Does patient have siblings?: Yes Number of Siblings: 2 Description of patient's current relationship with siblings: half sister, close with one sister, ok with the other Did patient suffer any verbal/emotional/physical/sexual abuse as a child?: No Did patient suffer from severe childhood neglect?: No Has patient ever been sexually abused/assaulted/raped as an adolescent or adult?: No Was the patient ever a victim of a crime or a disaster?: No Witnessed domestic violence?: No Has patient been affected by domestic violence as an adult?: No  Child/Adolescent Assessment:     CCA Substance Use Alcohol/Drug Use: Alcohol / Drug Use Pain Medications: See patient MAR Prescriptions: See patient MAR Over the Counter: See patient MAR History of alcohol / drug use?: No history of alcohol / drug abuse                         ASAM's:  Six Dimensions of Multidimensional Assessment  Dimension 1:  Acute Intoxication and/or Withdrawal Potential:   Dimension 1:  Description of individual's past and current experiences of substance use and withdrawal: None  Dimension 2:  Biomedical Conditions and Complications:   Dimension 2:  Description of patient's biomedical conditions and   complications: None  Dimension 3:  Emotional, Behavioral, or Cognitive Conditions and Complications:  Dimension 3:  Description of emotional, behavioral, or cognitive conditions and complications: None  Dimension 4:  Readiness to Change:  Dimension 4:  Description of Readiness to Change criteria: None  Dimension 5:  Relapse, Continued use, or Continued Problem Potential:  Dimension 5:  Relapse, continued use, or continued problem potential critiera description: None  Dimension 6:  Recovery/Living Environment:  Dimension 6:  Recovery/Iiving environment criteria description: NOne  ASAM Severity Score: ASAM's Severity Rating Score: 0  ASAM Recommended Level of Treatment:     Substance use Disorder (SUD)    Recommendations for Services/Supports/Treatments: Recommendations for Services/Supports/Treatments Recommendations For Services/Supports/Treatments: Medication Management,Individual Therapy  DSM5 Diagnoses: Patient Active Problem List   Diagnosis Date Noted  . Cervical radiculopathy at C7 12/22/2017  . Numbness of right foot 09/11/2017  . Dermatofibroma 09/25/2016  . OSA (obstructive sleep apnea) 09/17/2016  . Skin lesion 08/28/2016  . Excessive sweating 07/31/2016  . Hypothyroid 05/12/2016  . Pain in joint, shoulder region 02/26/2016  . Acute maxillary sinusitis 12/28/2015  . GAD (generalized anxiety disorder) 12/19/2015  . Peroneal tendonitis of right lower extremity 12/19/2015  . Teeth grinding 10/02/2015  . Family history of colon cancer 08/22/2015  . Abnormal ECG 06/27/2015  . Migraine variant 02/22/2015  . Fatigue 02/22/2015  . Major depression 06/13/2014  . Essential (primary) hypertension 06/13/2014  . Personal history of other endocrine, nutritional and metabolic disease 80/32/1224  . Morbid obesity ([place]) 06/13/2014  . Hypotestosteronism 06/13/2014  . Hx of Graves' disease 06/13/2014    Patient Centered Plan: Patient is on the following Treatment Plan(s):  Anxiety  and Depression   Referrals to Alternative Service(s): Referred to Alternative Service(s):   Place:   Date:   Time:    Referred to Alternative Service(s):   Place:   Date:   Time:    Referred to Alternative Service(s):   Place:   Date:   Time:    Referred to Alternative Service(s):   Place:   Date:   Time:     I discussed the assessment and treatment plan with the patient. The patient was provided an opportunity to ask questions and all were answered. The patient agreed with the plan and demonstrated an understanding of the instructions.   The patient was advised to call back or seek an in-person evaluation if the symptoms worsen or if the condition fails to improve as anticipated.  I provided 60 minutes of non-face-to-face time during this encounter.  Glori Bickers, LCSW

## 2020-04-12 ENCOUNTER — Telehealth (INDEPENDENT_AMBULATORY_CARE_PROVIDER_SITE_OTHER): Payer: BC Managed Care – PPO | Admitting: Psychiatry

## 2020-04-12 ENCOUNTER — Encounter (HOSPITAL_COMMUNITY): Payer: Self-pay | Admitting: Psychiatry

## 2020-04-12 DIAGNOSIS — F411 Generalized anxiety disorder: Secondary | ICD-10-CM

## 2020-04-12 DIAGNOSIS — F331 Major depressive disorder, recurrent, moderate: Secondary | ICD-10-CM

## 2020-04-12 MED ORDER — FLUOXETINE HCL 20 MG PO CAPS: 20.0000 mg | ORAL_CAPSULE | Freq: Every day | ORAL | 0 refills | Status: DC

## 2020-04-12 NOTE — Progress Notes (Signed)
Psychiatric Initial Adult Assessment   Patient Identification: Tyler Hancock MRN:  [id number] Date of Evaluation:  04/12/2020 Referral Source: primary care Chief Complaint:  establish care, depression Visit Diagnosis:    ICD-10-CM   1. Major depressive disorder, recurrent episode, moderate with anxious distress ([place])  F33.1   2. GAD (generalized anxiety disorder)  F41.1    Virtual Visit via Video Note  I connected with Tyler Hancock on 04/12/20 at  9:00 AM EST by a video enabled telemedicine application and verified that I am speaking with the correct person using two identifiers.  Location: Patient: home Provider: office   I discussed the limitations of evaluation and management by telemedicine and the availability of in person appointments. The patient expressed understanding and agreed to proceed.    I discussed the assessment and treatment plan with the patient. The patient was provided an opportunity to ask questions and all were answered. The patient agreed with the plan and demonstrated an understanding of the instructions.   The patient was advised to call back or seek an in-person evaluation if the symptoms worsen or if the condition fails to improve as anticipated.  I provided 32  minutes of non-face-to-face time during this encounter.   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  Patient has been seen at this clinic 4 years ago diagnosed with depressive disorder currently is on Wellbutrin BuSpar and sertraline.  He still endorses feeling some down days with decreased energy forgetfulness decreased interest in a motivation not having crying spells or hopelessness or suicidal thoughts  Patient states due to Covid things have  been different financial stressors at home as his wife was not working in the beginning.  Financial stress and staying at home working from home also has caused him some anxiety as of now to go out he is not he used to that and avoids or have sertraline anxiety around crowds or avoidance  He feels he has been on medication for a long time and wants to consider change if needed he does not regularly use a CPAP machine he understands the risk and we talked about sleep apnea contributing to depression or a motivation fatigue as well.  He endorses worries excessive at times worries about his finances.  Does not endorse panic attacks as of now does not endorse any acute pain he does suffer from pain condition cervical disc related and does stretching exercises  Does not endorse psychotic symptoms delusions hallucinations paranoia or manic symptoms currently or in the past  He feels medication may working some but he feels somewhat down or sad days with decreased energy and wants to consider a change if needed  Aggravating factors; finances Covid pandemic Modifying factors for daughters, wife daughter job  Duration for 5 years or more  Alcohol use has been drinking 2 or 3 beers or 2 glasses of wine daily has been treated in the recent months.  Was using less before      Past Psychiatric History: depression, anxiety  Previous Psychotropic Medications: Yes   Substance Abuse History in the last 12 months:  Yes.    Consequences of Substance Abuse: discussed alcohol effect on depression, amotivation  Past Medical History:  Past Medical History:  Diagnosis  Date  . Chronic sinus complaints   . 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 GAD (generalized anxiety disorder) 12/19/2015  . GERD (gastroesophageal reflux disease)   . Graves disease   . Hypertension   . Hypotestosteronism 06/13/2014  . Major depression 06/13/2014  . Migraine variant   . Morbid obesity ([place]) 06/13/2014  . OSA  (obstructive sleep apnea)   . Thyrotoxic crisis    hx of    Past Surgical History:  Procedure Laterality Date  . ADENOIDECTOMY    . ANTERIOR CERVICAL DECOMP/DISCECTOMY FUSION  10/06/2018   C6-C7  . REMOVAL OF TISSUE BILATERAL EUSTACHIAN TUBE DILATION ; Surgeon: Ileene Rubens, MD; Location: [facility]; Service  08/30/2014  . SEPTOPLASTY, BILATERAL ENDOSCOPIC MIDDLE MEATAL ANTROSTOMY  08/30/2014  . TONGUE SURGERY      Family Psychiatric History: mom depression Mom side depression Uncle schizophrenia  Family History:  Family History  Problem Relation Age of Onset  . Schizophrenia Maternal Grandfather   . Colon cancer Maternal Grandfather   . Early death Maternal Grandfather   . Diabetes Father   . Colon cancer Paternal Uncle   . Early death Paternal Uncle   . Depression Mother   . Stroke Paternal Grandmother     Social History:   Social History   Socioeconomic History  . Marital status: Married    Spouse name: Not on file  . Number of children: Not on file  . Years of education: Not on file  . Highest education level: Not on file  Occupational History  . Not on file  Tobacco Use  . Smoking status: Never Smoker  . Smokeless tobacco: Never Used  Substance and Sexual Activity  . Alcohol use: Yes    Alcohol/week: 0.0 standard drinks  . Drug use: No  . Sexual activity: Yes  Other Topics Concern  . Not on file  Social History Narrative  . Not on file   Social Determinants of Health   Financial Resource Strain: Not on file  Food Insecurity: Not on file  Transportation Needs: Not on file  Physical Activity: Not on file  Stress: Not on file  Social Connections: Not on file    Additional Social History: grew up with parents , they were seperated at times. Arguments at home. Dad was passive Mom had depression, denies physical or sexual abuse  Allergies:   Allergies  Allergen Reactions  . Nickel Other (See Comments)    Skin irritation    Metabolic Disorder  Labs: Lab Results  Component Value Date   HGBA1C 5.0 02/11/2018   MPG 97 02/11/2018   MPG 94 04/15/2017   No results found for: PROLACTIN Lab Results  Component Value Date   CHOL 168 04/15/2017   TRIG 78 04/15/2017   HDL 49 04/15/2017   CHOLHDL 3.4 04/15/2017   VLDL 8 02/26/2015   LDLCALC 102 (H) 04/15/2017   LDLCALC 69 02/26/2015   Lab Results  Component Value Date   TSH 24.98 (H) 06/24/2019    Therapeutic Level Labs: No results found for: LITHIUM No results found for: CBMZ No results found for: VALPROATE  Current Medications: Current Outpatient Medications  Medication Sig Dispense Refill  . FLUoxetine (PROZAC) 20 MG capsule Take 1 capsule (20 mg total) by mouth daily. 30 capsule 0  . AMBULATORY NON FORMULARY MEDICATION Change Continuous positive airway pressure (CPAP) machine setting to 11 cm of H2O pressure, with 15 min ramp. Send 30 day report after 30 days of use. 1 each 0  .  buPROPion (WELLBUTRIN XL) 150 MG 24 hr tablet TAKE 3 TABLETS (450 MG TOTAL) DAILY 270 tablet 1  . busPIRone (BUSPAR) 10 MG tablet Take 1 tablet (10 mg total) by mouth daily as needed. 90 tablet 1  . doxycycline (VIBRA-TABS) 100 MG tablet Take 1 tablet (100 mg total) by mouth 2 (two) times daily. 20 tablet 0  . gabapentin (NEURONTIN) 300 MG capsule One tab PO qHS for a week, then BID for a week, then TID. May double weekly to a max of 3,600mg /day 180 capsule 3  . NEEDLE, DISP, 18 G (B-D HYPODERMIC NEEDLE 18GX1.5") 18G X 1-1/2" MISC USE AS DIRECTED to draw up testosterone 100 each 1  . NEEDLE, DISP, 22 G (BD DISP NEEDLES) 22G X 1-1/2" MISC USE AS DIRECTED to inject testosterone 100 each 1  . omeprazole (PRILOSEC) 40 MG capsule Take 1 capsule (40 mg total) by mouth daily. 90 capsule 3  . SYNTHROID 100 MCG tablet Take 1 tablet (100 mcg total) by mouth daily before breakfast. Take with 255mcg dose. 90 tablet 0  . SYNTHROID 200 MCG tablet Take 1 tablet (200 mcg total) by mouth daily before breakfast. 90  tablet 0  . SYRINGE DISPOSABLE 3CC 3 ML MISC Use 1 syringe per week with testosterone injection. 25 each prn  . testosterone cypionate (DEPOTESTOSTERONE CYPIONATE) 200 MG/ML injection Inject 0.75 mLs (150 mg total) into the muscle once a week. 18 mL 1  . Testosterone Enanthate (XYOSTED) 100 MG/0.5ML SOAJ Inject into the skin.    . valsartan (DIOVAN) 320 MG tablet Take 1 tablet (320 mg total) by mouth daily. 90 tablet 2   No current facility-administered medications for this visit.     Psychiatric Specialty Exam: Review of Systems  Constitutional: Positive for fatigue.  Cardiovascular: Negative for chest pain.  Psychiatric/Behavioral: Positive for dysphoric mood. Negative for agitation and self-injury.    There were no vitals taken for this visit.There is no height or weight on file to calculate BMI.  General Appearance: Casual  Eye Contact:  Fair  Speech:  Normal Rate  Volume:  Decreased  Mood:  Dysphoric  Affect:  Congruent  Thought Process:  Goal Directed  Orientation:  Full (Time, Place, and Person)  Thought Content:  Rumination  Suicidal Thoughts:  No  Homicidal Thoughts:  No  Memory:  Immediate;   Fair Recent;   Fair  Judgement:  Fair  Insight:  Shallow  Psychomotor Activity:  Decreased  Concentration:  Concentration: Fair and Attention Span: Fair  Recall:  [place] of Knowledge:Good  Language: Good  Akathisia:  No  Handed:   AIMS (if indicated):  not done  Assets:  Desire for Improvement Housing Social Support  ADL's:  Intact  Cognition: WNL  Sleep:  Fair   Screenings: GAD-7   Flowsheet Row Office Visit from 12/26/2019 in [facility] Office Visit from 04/17/2017 in [place] Office Visit from 01/17/2016 in [facility] Office Visit from 01/15/2016 in [facility] Visit from 12/19/2015 in [place]  Total GAD-7 Score 5 6 10 11 13     PHQ2-9   Flowsheet Row Video Visit from 04/12/2020 in [place] Counselor from 04/02/2020 in [place] Office Visit from 12/26/2019 in [place] Visit from 04/17/2017 in [facility] [place] Visit from 01/17/2016 in [place]  PHQ-2 Total Score 2 1 3 2 3   PHQ-9 Total Score 14 -- 8 7 11     Flowsheet Row Video Visit from 04/12/2020 in [place] Counselor from 04/02/2020 in [place] No Risk No Risk      Assessment and Plan: as follows  Major depressive disorder recurrent moderate; he has been on sertraline the longest.  We talked about changing it to Prozac considering a motivation and depression and can take it in the morning taper down the sertraline the next 1-1/2 to 2 weeks we will start Prozac 20 mg today continue Wellbutrin And regarding to fatigue I highly suggested he follow-up with his sleep clinic or be compliant with his CPAP machine at it can be contributing to the tiredness and a motivation   Generalized anxiety disorder; continue BuSpar change sertraline to Prozac continue therapy that he has started to work on coping skills and motivation  Add more activities during the day or distraction  Discussed to cut down alcohol intake to 2 or 3 maximum during the week  Follow-up in 3 to 4 weeks or earlier if needed   Merian Capron, MD 3/3/20229:35 AM

## 2020-05-01 DIAGNOSIS — E291 Testicular hypofunction: Secondary | ICD-10-CM | POA: Diagnosis not present

## 2020-05-01 DIAGNOSIS — E890 Postprocedural hypothyroidism: Secondary | ICD-10-CM | POA: Diagnosis not present

## 2020-05-02 DIAGNOSIS — Z981 Arthrodesis status: Secondary | ICD-10-CM | POA: Diagnosis not present

## 2020-05-02 DIAGNOSIS — M542 Cervicalgia: Secondary | ICD-10-CM | POA: Diagnosis not present

## 2020-05-07 ENCOUNTER — Ambulatory Visit (INDEPENDENT_AMBULATORY_CARE_PROVIDER_SITE_OTHER): Payer: BC Managed Care – PPO

## 2020-05-07 ENCOUNTER — Other Ambulatory Visit: Payer: Self-pay

## 2020-05-07 ENCOUNTER — Ambulatory Visit: Payer: BC Managed Care – PPO | Admitting: Family Medicine

## 2020-05-07 ENCOUNTER — Ambulatory Visit (INDEPENDENT_AMBULATORY_CARE_PROVIDER_SITE_OTHER): Payer: BC Managed Care – PPO | Admitting: Family Medicine

## 2020-05-07 ENCOUNTER — Encounter: Payer: Self-pay | Admitting: Family Medicine

## 2020-05-07 VITALS — BP 126/89 | HR 79 | Ht 75.0 in | Wt 398.0 lb

## 2020-05-07 DIAGNOSIS — M170 Bilateral primary osteoarthritis of knee: Secondary | ICD-10-CM | POA: Insufficient documentation

## 2020-05-07 DIAGNOSIS — M25562 Pain in left knee: Secondary | ICD-10-CM

## 2020-05-07 DIAGNOSIS — S8992XA Unspecified injury of left lower leg, initial encounter: Secondary | ICD-10-CM | POA: Diagnosis not present

## 2020-05-07 MED ORDER — MELOXICAM 15 MG PO TABS: 15.0000 mg | ORAL_TABLET | Freq: Every day | ORAL | 0 refills | Status: DC | PRN

## 2020-05-07 NOTE — Patient Instructions (Signed)
Have xray completed.  Try meloxicam daily as needed.  Let me know if not improving with this.

## 2020-05-07 NOTE — Assessment & Plan Note (Signed)
Tenderness and pain along the patellar tendon, likely tendonitis.  Xrays ordered.  Start meloxicam daily for the next week then daily as needed.  He will return if not improving.

## 2020-05-07 NOTE — Progress Notes (Signed)
Tyler Hancock - 40 y.o. male MRN [id number]  Date of birth: [birth year]  Subjective Chief Complaint  Patient presents with  . Knee Pain    HPI Tyler Hancock is a 40 y.o. male here today with complaint of L knee pain.  He was playing tennis with his daughter a few days ago and had some mild pain while playing but noticed this was worse the following day.  He noted increased pain while riding in the car the other day.  Had to stop by [employer] and buy some instant ice packs which provided some relief.  He has not noted any swelling of the knee, locking or instability.  He has started using kinesiotape with some relief.    ROS:  A comprehensive ROS was completed and negative except as noted per HPI  Allergies  Allergen Reactions  . Nickel Other (See Comments)    Skin irritation    Past Medical History:  Diagnosis Date  . Chronic sinus complaints   . 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 GAD (generalized anxiety disorder) 12/19/2015  . GERD (gastroesophageal reflux disease)   . Graves disease   . Hypertension   . Hypotestosteronism 06/13/2014  . Major depression 06/13/2014  . Migraine variant   . Morbid obesity ([place]) 06/13/2014  . OSA (obstructive sleep apnea)   . Thyrotoxic crisis    hx of    Past Surgical History:  Procedure Laterality Date  . ADENOIDECTOMY    . ANTERIOR CERVICAL DECOMP/DISCECTOMY FUSION  10/06/2018   C6-C7  . REMOVAL OF TISSUE BILATERAL EUSTACHIAN TUBE DILATION ; Surgeon: Ileene Rubens, MD; Location: [facility]; Service  08/30/2014  . SEPTOPLASTY, BILATERAL ENDOSCOPIC MIDDLE MEATAL ANTROSTOMY  08/30/2014  . TONGUE SURGERY      Social History   Socioeconomic History  . Marital status: Married    Spouse name: Not on file  . Number of children: Not on file  . Years of education: Not on file  . Highest education level: Not on file  Occupational History  . Not on file  Tobacco Use  . Smoking status: Never Smoker  . Smokeless tobacco: Never Used   Substance and Sexual Activity  . Alcohol use: Yes    Alcohol/week: 0.0 standard drinks  . Drug use: No  . Sexual activity: Yes  Other Topics Concern  . Not on file  Social History Narrative  . Not on file   Social Determinants of Health   Financial Resource Strain: Not on file  Food Insecurity: Not on file  Transportation Needs: Not on file  Physical Activity: Not on file  Stress: Not on file  Social Connections: Not on file    Family History  Problem Relation Age of Onset  . Schizophrenia Maternal Grandfather   . Colon cancer Maternal Grandfather   . Early death Maternal Grandfather   . Diabetes Father   . Colon cancer Paternal Uncle   . Early death Paternal Uncle   . Depression Mother   . Stroke Paternal Grandmother     Health Maintenance  Topic Date Due  . Hepatitis C Screening  Never done  . HIV Screening  Never done  . TETANUS/TDAP  05/31/2028  . INFLUENZA VACCINE  Completed  . COVID-19 Vaccine  Completed  . HPV VACCINES  Aged Out     ----------------------------------------------------------------------------------------------------------------------------------------------------------------------------------------------------------------- Physical Exam BP 126/89 (BP Location: Left Arm, Patient Position: Sitting, Cuff Size: Large)   Pulse 79   Ht 6\' 3"  (1.905 m)   Wt Marland Kitchen)  398 lb (180.5 kg)   SpO2 97%   BMI 49.75 kg/m   Physical Exam Constitutional:      Appearance: Normal appearance.  HENT:     Head: Normocephalic and atraumatic.  Musculoskeletal:     Cervical back: Neck supple.     Comments: L knee without effusion.  TTP along lower pole of patella and patellar tendon.   ROM of knee is normal.  Negative lachman.  No signifcant pain of laxity with valgus and varus stress.  Negative meniscal provocation testing (Thessaly and McMurray)  Neurological:     General: No focal deficit present.     Mental Status: He is alert.  Psychiatric:         Mood and Affect: Mood normal.        Behavior: Behavior normal.     ------------------------------------------------------------------------------------------------------------------------------------------------------------------------------------------------------------------- Assessment and Plan  Left knee pain Tenderness and pain along the patellar tendon, likely tendonitis.  Xrays ordered.  Start meloxicam daily for the next week then daily as needed.  He will return if not improving.     Meds ordered this encounter  Medications  . meloxicam (MOBIC) 15 MG tablet    Sig: Take 1 tablet (15 mg total) by mouth daily as needed for pain.    Dispense:  30 tablet    Refill:  0    No follow-ups on file.    This visit occurred during the SARS-CoV-2 public health emergency.  Safety protocols were in place, including screening questions prior to the visit, additional usage of staff PPE, and extensive cleaning of exam room while observing appropriate contact time as indicated for disinfecting solutions.

## 2020-05-08 DIAGNOSIS — E890 Postprocedural hypothyroidism: Secondary | ICD-10-CM | POA: Diagnosis not present

## 2020-05-08 DIAGNOSIS — Z6841 Body Mass Index (BMI) 40.0 and over, adult: Secondary | ICD-10-CM | POA: Diagnosis not present

## 2020-05-08 DIAGNOSIS — E291 Testicular hypofunction: Secondary | ICD-10-CM | POA: Diagnosis not present

## 2020-05-10 ENCOUNTER — Telehealth (HOSPITAL_COMMUNITY): Payer: BC Managed Care – PPO | Admitting: Psychiatry

## 2020-05-10 ENCOUNTER — Other Ambulatory Visit: Payer: Self-pay

## 2020-05-14 ENCOUNTER — Telehealth (HOSPITAL_COMMUNITY): Payer: BC Managed Care – PPO | Admitting: Psychiatry

## 2020-05-16 ENCOUNTER — Encounter: Payer: Self-pay | Admitting: Family Medicine

## 2020-05-16 ENCOUNTER — Other Ambulatory Visit: Payer: Self-pay

## 2020-05-16 ENCOUNTER — Ambulatory Visit (INDEPENDENT_AMBULATORY_CARE_PROVIDER_SITE_OTHER): Payer: BC Managed Care – PPO | Admitting: Family Medicine

## 2020-05-16 VITALS — BP 144/83 | HR 80 | Temp 97.7°F | Ht 75.0 in | Wt 399.3 lb

## 2020-05-16 DIAGNOSIS — E890 Postprocedural hypothyroidism: Secondary | ICD-10-CM

## 2020-05-16 DIAGNOSIS — I10 Essential (primary) hypertension: Secondary | ICD-10-CM

## 2020-05-16 DIAGNOSIS — Z13220 Encounter for screening for lipoid disorders: Secondary | ICD-10-CM

## 2020-05-16 DIAGNOSIS — G4733 Obstructive sleep apnea (adult) (pediatric): Secondary | ICD-10-CM

## 2020-05-16 DIAGNOSIS — Z0000 Encounter for general adult medical examination without abnormal findings: Secondary | ICD-10-CM | POA: Diagnosis not present

## 2020-05-16 MED ORDER — SEMAGLUTIDE-WEIGHT MANAGEMENT 1 MG/0.5ML ~~LOC~~ SOAJ: 1.0000 mg | SUBCUTANEOUS | 1 refills | Status: DC

## 2020-05-16 MED ORDER — SEMAGLUTIDE-WEIGHT MANAGEMENT 0.25 MG/0.5ML ~~LOC~~ SOAJ: 0.2500 mg | SUBCUTANEOUS | 0 refills | Status: AC

## 2020-05-16 MED ORDER — SEMAGLUTIDE-WEIGHT MANAGEMENT 0.5 MG/0.5ML ~~LOC~~ SOAJ: 0.5000 mg | SUBCUTANEOUS | 0 refills | Status: DC

## 2020-05-16 NOTE — Assessment & Plan Note (Signed)
Well adult Orders Placed This Encounter  Procedures  . COMPLETE METABOLIC PANEL WITH GFR  . CBC with Differential  . Lipid Panel w/reflex Direct LDL  . Ambulatory referral to Sleep Studies    Referral Priority:   Routine    Referral Type:   Consultation    Referral Reason:   Specialty Services Required    Number of Visits Requested:   1  Immunizations: UTD Screening: Lipid panel Anticipatory guidance/Risk factor reduction:  Recommendations per AVS.  Really Barth Kirks the thought man.

## 2020-05-16 NOTE — Assessment & Plan Note (Signed)
He continues to struggle with weight management.  We will have him restart Ajovy at starting dose of 0.25 mg and titrate over the next few months.  We discussed continue to work on dietary and activity changes.  We can consider referral to healthy weight and wellness if he is not making significant progress with this plan.

## 2020-05-16 NOTE — Assessment & Plan Note (Signed)
Referral placed to sleep specialists as he has had trouble with mask seal and feels like he needs retitration for his CPAP.

## 2020-05-16 NOTE — Progress Notes (Signed)
Tyler Hancock - 40 y.o. male MRN [id number]  Date of birth: 1980-12-28  Subjective Chief Complaint  Patient presents with  . Annual Exam    HPI Tyler Hancock is a 40 y.o. male here today for annual exam.  He continues to see endocrinology for management of hypothyroidism and hypogonadism.  He did recently have his levothyroxine adjusted.  He also continues to see psychiatry for management of depression and anxiety.  He does have history of OSA however is not using CPAP regularly.  He has difficulty maintaining a seal with his current mask he does like he may need a retitration study.  He requests referral for this.  He continues to struggle with weight loss.  He was taking regularly however had some GI upset once he related to the 1 mg dose.  He has been off this for a few weeks.  He would like to restart again at a starting dose  He has been trying to work on increased physical activity.  He is working on diet as well.  Review of Systems  Constitutional: Negative for chills, fever, malaise/fatigue and weight loss.  HENT: Negative for congestion, ear pain and sore throat.   Eyes: Negative for blurred vision, double vision and pain.  Respiratory: Negative for cough and shortness of breath.   Cardiovascular: Negative for chest pain and palpitations.  Gastrointestinal: Negative for abdominal pain, blood in stool, constipation, heartburn and nausea.  Genitourinary: Negative for dysuria and urgency.  Musculoskeletal: Negative for joint pain and myalgias.  Neurological: Negative for dizziness and headaches.  Endo/Heme/Allergies: Does not bruise/bleed easily.  Psychiatric/Behavioral: Negative for depression. The patient is not nervous/anxious and does not have insomnia.      Allergies  Allergen Reactions  . Nickel Other (See Comments)    Skin irritation    Past Medical History:  Diagnosis Date  . Chronic sinus complaints   . 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 GAD  (generalized anxiety disorder) 12/19/2015  . GERD (gastroesophageal reflux disease)   . Graves disease   . Hypertension   . Hypotestosteronism 06/13/2014  . Major depression 06/13/2014  . Migraine variant   . Morbid obesity ([place]) 06/13/2014  . OSA (obstructive sleep apnea)   . Thyrotoxic crisis    hx of    Past Surgical History:  Procedure Laterality Date  . ADENOIDECTOMY    . ANTERIOR CERVICAL DECOMP/DISCECTOMY FUSION  10/06/2018   C6-C7  . REMOVAL OF TISSUE BILATERAL EUSTACHIAN TUBE DILATION ; Surgeon: Ileene Rubens, MD; Location: [facility]; Service  08/30/2014  . SEPTOPLASTY, BILATERAL ENDOSCOPIC MIDDLE MEATAL ANTROSTOMY  08/30/2014  . TONGUE SURGERY      Social History   Socioeconomic History  . Marital status: Married    Spouse name: Not on file  . Number of children: Not on file  . Years of education: Not on file  . Highest education level: Not on file  Occupational History  . Not on file  Tobacco Use  . Smoking status: Never Smoker  . Smokeless tobacco: Never Used  Substance and Sexual Activity  . Alcohol use: Yes    Alcohol/week: 0.0 standard drinks  . Drug use: No  . Sexual activity: Yes  Other Topics Concern  . Not on file  Social History Narrative  . Not on file   Social Determinants of Health   Financial Resource Strain: Not on file  Food Insecurity: Not on file  Transportation Needs: Not on file  Physical Activity: Not on file  Stress: Not on file  Social Connections: Not on file    Family History  Problem Relation Age of Onset  . Schizophrenia Maternal Grandfather   . Colon cancer Maternal Grandfather   . Early death Maternal Grandfather   . Diabetes Father   . Colon cancer Paternal Uncle   . Early death Paternal Uncle   . Depression Mother   . Stroke Paternal Grandmother     Health Maintenance  Topic Date Due  . Hepatitis C Screening  Never done  . HIV Screening  Never done  . INFLUENZA VACCINE  09/10/2020  . TETANUS/TDAP  05/31/2028  .  COVID-19 Vaccine  Completed  . HPV VACCINES  Aged Out     ----------------------------------------------------------------------------------------------------------------------------------------------------------------------------------------------------------------- Physical Exam BP (!) 144/83 (BP Location: Left Arm, Patient Position: Sitting, Cuff Size: Large)   Pulse 80   Temp 97.7 F (36.5 C)   Ht 6\' 3"  (1.905 m)   Wt (!) 399 lb 4.8 oz (181.1 kg)   SpO2 98%   BMI 49.91 kg/m   Physical Exam Constitutional:      General: He is not in acute distress. HENT:     Head: Normocephalic and atraumatic.     Right Ear: External ear normal.     Left Ear: External ear normal.  Eyes:     General: No scleral icterus. Neck:     Thyroid: No thyromegaly.  Cardiovascular:     Rate and Rhythm: Normal rate and regular rhythm.     Heart sounds: Normal heart sounds.  Pulmonary:     Effort: Pulmonary effort is normal.     Breath sounds: Normal breath sounds.  Abdominal:     General: Bowel sounds are normal. There is no distension.     Palpations: Abdomen is soft.     Tenderness: There is no abdominal tenderness. There is no guarding.  Musculoskeletal:     Cervical back: Normal range of motion.  Lymphadenopathy:     Cervical: No cervical adenopathy.  Skin:    General: Skin is warm and dry.     Findings: No rash.  Neurological:     Mental Status: He is alert and oriented to person, place, and time.     Cranial Nerves: No cranial nerve deficit.     Motor: No abnormal muscle tone.  Psychiatric:        Behavior: Behavior normal.     ------------------------------------------------------------------------------------------------------------------------------------------------------------------------------------------------------------------- Assessment and Plan  OSA (obstructive sleep apnea) Referral placed to sleep specialists as he has had trouble with mask seal and feels like he  needs retitration for his CPAP.  Morbid obesity ([place]) He continues to struggle with weight management.  We will have him restart Ajovy at starting dose of 0.25 mg and titrate over the next few months.  We discussed continue to work on dietary and activity changes.  We can consider referral to healthy weight and wellness if he is not making significant progress with this plan.  Well adult exam Well adult Orders Placed This Encounter  Procedures  . COMPLETE METABOLIC PANEL WITH GFR  . CBC with Differential  . Lipid Panel w/reflex Direct LDL  . Ambulatory referral to Sleep Studies    Referral Priority:   Routine    Referral Type:   Consultation    Referral Reason:   Specialty Services Required    Number of Visits Requested:   1  Immunizations: UTD Screening: Lipid panel Anticipatory guidance/Risk factor reduction:  Recommendations per AVS.  Really Barth Kirks the thought man.  Meds ordered this encounter  Medications  . Semaglutide-Weight Management 0.25 MG/0.5ML SOAJ    Sig: Inject 0.25 mg into the skin once a week for 28 days.    Dispense:  2 mL    Refill:  0  . Semaglutide-Weight Management 0.5 MG/0.5ML SOAJ    Sig: Inject 0.5 mg into the skin once a week for 28 days.    Dispense:  2 mL    Refill:  0  . Semaglutide-Weight Management 1 MG/0.5ML SOAJ    Sig: Inject 1 mg into the skin once a week for 28 days.    Dispense:  2 mL    Refill:  1    No follow-ups on file.    This visit occurred during the SARS-CoV-2 public health emergency.  Safety protocols were in place, including screening questions prior to the visit, additional usage of staff PPE, and extensive cleaning of exam room while observing appropriate contact time as indicated for disinfecting solutions.

## 2020-05-16 NOTE — Patient Instructions (Signed)
Preventive Care 41-38 Years Old, Male Preventive care refers to lifestyle choices and visits with your health care provider that can promote health and wellness. This includes:  A yearly physical exam. This is also called an annual wellness visit.  Regular dental and eye exams.  Immunizations.  Screening for certain conditions.  Healthy lifestyle choices, such as: ? Eating a healthy diet. ? Getting regular exercise. ? Not using drugs or products that contain nicotine and tobacco. ? Limiting alcohol use. What can I expect for my preventive care visit? Physical exam Your health care provider may check your:  Height and weight. These may be used to calculate your BMI (body mass index). BMI is a measurement that tells if you are at a healthy weight.  Heart rate and blood pressure.  Body temperature.  Skin for abnormal spots. Counseling Your health care provider may ask you questions about your:  Past medical problems.  Family's medical history.  Alcohol, tobacco, and drug use.  Emotional well-being.  Home life and relationship well-being.  Sexual activity.  Diet, exercise, and sleep habits.  Work and work Statistician.  Access to firearms. What immunizations do I need? Vaccines are usually given at various ages, according to a schedule. Your health care provider will recommend vaccines for you based on your age, medical history, and lifestyle or other factors, such as travel or where you work.   What tests do I need? Blood tests  Lipid and cholesterol levels. These may be checked every 5 years starting at age 30.  Hepatitis C test.  Hepatitis B test. Screening  Diabetes screening. This is done by checking your blood sugar (glucose) after you have not eaten for a while (fasting).  Genital exam to check for testicular cancer or hernias.  STD (sexually transmitted disease) testing, if you are at risk. Talk with your health care provider about your test results,  treatment options, and if necessary, the need for more tests.   Follow these instructions at home: Eating and drinking  Eat a healthy diet that includes fresh fruits and vegetables, whole grains, lean protein, and low-fat dairy products.  Drink enough fluid to keep your urine pale yellow.  Take vitamin and mineral supplements as recommended by your health care provider.  Do not drink alcohol if your health care provider tells you not to drink.  If you drink alcohol: ? Limit how much you have to 0-2 drinks a day. ? Be aware of how much alcohol is in your drink. In the [place] one drink equals one 12 oz bottle of beer (355 mL), one 5 oz glass of wine (148 mL), or one 1 oz glass of hard liquor (44 mL).   Lifestyle  Take daily care of your teeth and gums. Brush your teeth every morning and night with fluoride toothpaste. Floss one time each day.  Stay active. Exercise for at least 30 minutes 5 or more days each week.  Do not use any products that contain nicotine or tobacco, such as cigarettes, e-cigarettes, and chewing tobacco. If you need help quitting, ask your health care provider.  Do not use drugs.  If you are sexually active, practice safe sex. Use a condom or other form of protection to prevent STIs (sexually transmitted infections).  Find healthy ways to cope with stress, such as: ? Meditation, yoga, or listening to music. ? Journaling. ? Talking to a trusted person. ? Spending time with friends and family. Safety  Always wear your seat belt while driving  or riding in a vehicle.  Do not drive: ? If you have been drinking alcohol. Do not ride with someone who has been drinking. ? When you are tired or distracted. ? While texting.  Wear a helmet and other protective equipment during sports activities.  If you have firearms in your house, make sure you follow all gun safety procedures.  Seek help if you have been physically or sexually abused. What's next?  Go to your  health care provider once a year for an annual wellness visit.  Ask your health care provider how often you should have your eyes and teeth checked.  Stay up to date on all vaccines. This information is not intended to replace advice given to you by your health care provider. Make sure you discuss any questions you have with your health care provider. Document Revised: 10/13/2018 Document Reviewed: 01/21/2018 Elsevier Patient Education  2021 [employer].

## 2020-05-17 ENCOUNTER — Telehealth (INDEPENDENT_AMBULATORY_CARE_PROVIDER_SITE_OTHER): Payer: BC Managed Care – PPO | Admitting: Psychiatry

## 2020-05-17 ENCOUNTER — Encounter (HOSPITAL_COMMUNITY): Payer: Self-pay | Admitting: Psychiatry

## 2020-05-17 DIAGNOSIS — R5383 Other fatigue: Secondary | ICD-10-CM | POA: Diagnosis not present

## 2020-05-17 DIAGNOSIS — F331 Major depressive disorder, recurrent, moderate: Secondary | ICD-10-CM | POA: Diagnosis not present

## 2020-05-17 DIAGNOSIS — F411 Generalized anxiety disorder: Secondary | ICD-10-CM | POA: Diagnosis not present

## 2020-05-17 LAB — COMPLETE METABOLIC PANEL WITH GFR
AG Ratio: 1.5 (calc) (ref 1.0–2.5)
ALT: 27 U/L (ref 9–46)
AST: 18 U/L (ref 10–40)
Albumin: 4.3 g/dL (ref 3.6–5.1)
Alkaline phosphatase (APISO): 58 U/L (ref 36–130)
BUN: 14 mg/dL (ref 7–25)
CO2: 28 mmol/L (ref 20–32)
Calcium: 9.2 mg/dL (ref 8.6–10.3)
Chloride: 103 mmol/L (ref 98–110)
Creat: 0.92 mg/dL (ref 0.60–1.35)
GFR, Est African American: 123 mL/min/{1.73_m2} (ref >=60)
GFR, Est Non African American: 106 mL/min/{1.73_m2} (ref >=60)
Globulin: 2.9 g/dL (calc) (ref 1.9–3.7)
Glucose, Bld: 89 mg/dL (ref 65–139)
Potassium: 4.1 mmol/L (ref 3.5–5.3)
Sodium: 139 mmol/L (ref 135–146)
Total Bilirubin: 0.6 mg/dL (ref 0.2–1.2)
Total Protein: 7.2 g/dL (ref 6.1–8.1)

## 2020-05-17 LAB — LIPID PANEL W/REFLEX DIRECT LDL
Cholesterol: 155 mg/dL (ref <200)
HDL: 43 mg/dL (ref >=40)
LDL Cholesterol (Calc): 97 mg/dL (calc)
Non-HDL Cholesterol (Calc): 112 mg/dL (calc) (ref <130)
Total CHOL/HDL Ratio: 3.6 (calc) (ref <5.0)
Triglycerides: 66 mg/dL (ref <150)

## 2020-05-17 LAB — CBC WITH DIFFERENTIAL/PLATELET
Absolute Monocytes: 545 cells/uL (ref 200–950)
Basophils Absolute: 32 cells/uL (ref 0–200)
Basophils Relative: 0.4 %
Eosinophils Absolute: 174 cells/uL (ref 15–500)
Eosinophils Relative: 2.2 %
HCT: 48.5 % (ref 38.5–50.0)
Hemoglobin: 16.4 g/dL (ref 13.2–17.1)
Lymphs Abs: 2591 cells/uL (ref 850–3900)
MCH: 30.4 pg (ref 27.0–33.0)
MCHC: 33.8 g/dL (ref 32.0–36.0)
MCV: 89.8 fL (ref 80.0–100.0)
MPV: 11.8 fL (ref 7.5–12.5)
Monocytes Relative: 6.9 %
Neutro Abs: 4558 cells/uL (ref 1500–7800)
Neutrophils Relative %: 57.7 %
Platelets: 177 10*3/uL (ref 140–400)
RBC: 5.4 10*6/uL (ref 4.20–5.80)
RDW: 12.4 % (ref 11.0–15.0)
Total Lymphocyte: 32.8 %
WBC: 7.9 10*3/uL (ref 3.8–10.8)

## 2020-05-17 MED ORDER — FLUOXETINE HCL 10 MG PO CAPS: 10.0000 mg | ORAL_CAPSULE | Freq: Every day | ORAL | 1 refills | Status: DC

## 2020-05-17 NOTE — Progress Notes (Signed)
Psychiatric Initial Adult Assessment   Patient Identification: Tyler Hancock MRN:  [id number] Date of Evaluation:  05/17/2020 Referral Source: primary care Chief Complaint:  establish care, depression Visit Diagnosis:    ICD-10-CM   1. Major depressive disorder, recurrent episode, moderate with anxious distress ([place])  F33.1   2. GAD (generalized anxiety disorder)  F41.1   3. Tiredness  R53.83   Virtual Visit via Video Note  I connected with LODEN LAURENT on 05/17/20 at  3:45 PM EDT by a video enabled telemedicine application and verified that I am speaking with the correct person using two identifiers.  Location: Patient: home Provider: office   I discussed the limitations of evaluation and management by telemedicine and the availability of in person appointments. The patient expressed understanding and agreed to proceed.     I discussed the assessment and treatment plan with the patient. The patient was provided an opportunity to ask questions and all were answered. The patient agreed with the plan and demonstrated an understanding of the instructions.   The patient was advised to call back or seek an in-person evaluation if the symptoms worsen or if the condition fails to improve as anticipated.  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  Last visit zoloft changed to prozac and continued wellbutrin and buspar Decrease motivation at work Feeling somewhat anxious, also tired, going thru sleep study evaluation   Aggravating factors; finances Covid pandemic Modifying factors for daughters, wife, daughter Duration for 5  years or more  Alcohol use has been drinking 2 or 3 beers or 2 glasses of wine daily has been treated in the recent months.  Was using less before      Past Psychiatric History: depression, anxiety  Previous Psychotropic Medications: Yes   Substance Abuse History in the last 12 months:  Yes.    Consequences of Substance Abuse: discussed alcohol effect on depression, amotivation  Past Medical History:  Past Medical History:  Diagnosis Date  . Chronic sinus complaints   . 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 GAD (generalized anxiety disorder) 12/19/2015  . GERD (gastroesophageal reflux disease)   . Graves disease   . Hypertension   . Hypotestosteronism 06/13/2014  . Major depression 06/13/2014  . Migraine variant   . Morbid obesity ([place]) 06/13/2014  . OSA (obstructive sleep apnea)   . Thyrotoxic crisis    hx of    Past Surgical History:  Procedure Laterality Date  . ADENOIDECTOMY    . ANTERIOR CERVICAL DECOMP/DISCECTOMY FUSION  10/06/2018   C6-C7  . REMOVAL OF TISSUE BILATERAL EUSTACHIAN TUBE DILATION ; Surgeon: Ileene Rubens, MD; Location: [facility]; Service  08/30/2014  . SEPTOPLASTY, BILATERAL ENDOSCOPIC MIDDLE MEATAL ANTROSTOMY  08/30/2014  . TONGUE SURGERY      Family Psychiatric History: mom depression Mom side depression Uncle schizophrenia  Family History:  Family History  Problem Relation Age of Onset  . Schizophrenia Maternal Grandfather   . Colon cancer Maternal Grandfather   . Early death Maternal Grandfather   . Diabetes Father   . Colon cancer Paternal Uncle   . Early death Paternal Uncle   .  Depression Mother   . Stroke Paternal Grandmother     Social History:   Social History   Socioeconomic History  . Marital status: Married    Spouse name: Not on file  . Number of children: Not on file  . Years of education: Not on file  . Highest education level: Not on file  Occupational History  . Not on file  Tobacco Use  . Smoking status: Never  Smoker  . Smokeless tobacco: Never Used  Substance and Sexual Activity  . Alcohol use: Yes    Alcohol/week: 0.0 standard drinks  . Drug use: No  . Sexual activity: Yes  Other Topics Concern  . Not on file  Social History Narrative  . Not on file   Social Determinants of Health   Financial Resource Strain: Not on file  Food Insecurity: Not on file  Transportation Needs: Not on file  Physical Activity: Not on file  Stress: Not on file  Social Connections: Not on file      Allergies:   Allergies  Allergen Reactions  . Nickel Other (See Comments)    Skin irritation    Metabolic Disorder Labs: Lab Results  Component Value Date   HGBA1C 5.0 02/11/2018   MPG 97 02/11/2018   MPG 94 04/15/2017   No results found for: PROLACTIN Lab Results  Component Value Date   CHOL 155 05/16/2020   TRIG 66 05/16/2020   HDL 43 05/16/2020   CHOLHDL 3.6 05/16/2020   VLDL 8 02/26/2015   LDLCALC 97 05/16/2020   LDLCALC 102 (H) 04/15/2017   Lab Results  Component Value Date   TSH 24.98 (H) 06/24/2019    Therapeutic Level Labs: No results found for: LITHIUM No results found for: CBMZ No results found for: VALPROATE  Current Medications: Current Outpatient Medications  Medication Sig Dispense Refill  . AMBULATORY NON FORMULARY MEDICATION Change Continuous positive airway pressure (CPAP) machine setting to 11 cm of H2O pressure, with 15 min ramp. Send 30 day report after 30 days of use. 1 each 0  . B-D 3CC LUER-LOK SYR 23GX1" 23G X 1" 3 ML MISC SMARTSIG:1 Syringe(s) IM Once a Week    . buPROPion (WELLBUTRIN XL) 150 MG 24 hr tablet TAKE 3 TABLETS (450 MG TOTAL) DAILY 270 tablet 1  . busPIRone (BUSPAR) 10 MG tablet Take 1 tablet (10 mg total) by mouth daily as needed. 90 tablet 1  . FLUoxetine (PROZAC) 10 MG capsule Take 1 capsule (10 mg total) by mouth daily. 30 capsule 1  . gabapentin (NEURONTIN) 300 MG capsule One tab PO qHS for a week, then BID for a week, then TID. May double  weekly to a max of 3,600mg /day 180 capsule 3  . levothyroxine (SYNTHROID) 137 MCG tablet Take 274 mcg by mouth daily.    . meloxicam (MOBIC) 15 MG tablet Take 1 tablet (15 mg total) by mouth daily as needed for pain. 30 tablet 0  . NEEDLE, DISP, 18 G (B-D HYPODERMIC NEEDLE 18GX1.5") 18G X 1-1/2" MISC USE AS DIRECTED to draw up testosterone 100 each 1  . omeprazole (PRILOSEC) 40 MG capsule Take 1 capsule (40 mg total) by mouth daily. 90 capsule 3  . Semaglutide-Weight Management 0.25 MG/0.5ML SOAJ Inject 0.25 mg into the skin once a week for 28 days. 2 mL 0  . [START ON 06/14/2020] Semaglutide-Weight Management 0.5 MG/0.5ML SOAJ Inject 0.5 mg into the skin once a week for 28 days. 2 mL 0  . [START ON 07/13/2020] Semaglutide-Weight  Management 1 MG/0.5ML SOAJ Inject 1 mg into the skin once a week for 28 days. 2 mL 1  . SYRINGE DISPOSABLE 3CC 3 ML MISC Use 1 syringe per week with testosterone injection. 25 each prn  . testosterone cypionate (DEPOTESTOSTERONE CYPIONATE) 200 MG/ML injection Inject 0.75 mLs (150 mg total) into the muscle once a week. 18 mL 1  . Testosterone Enanthate (XYOSTED) 100 MG/0.5ML SOAJ Inject into the skin.    . valsartan (DIOVAN) 320 MG tablet Take 1 tablet (320 mg total) by mouth daily. 90 tablet 2   No current facility-administered medications for this visit.     Psychiatric Specialty Exam: Review of Systems  Constitutional: Positive for fatigue.  Cardiovascular: Negative for chest pain.  Psychiatric/Behavioral: Negative for agitation and self-injury.    There were no vitals taken for this visit.There is no height or weight on file to calculate BMI.  General Appearance: Casual  Eye Contact:  Fair  Speech:  Normal Rate  Volume:  Decreased  Mood:  Somewhat subdued  Affect:  Congruent  Thought Process:  Goal Directed  Orientation:  Full (Time, Place, and Person)  Thought Content:  Rumination  Suicidal Thoughts:  No  Homicidal Thoughts:  No  Memory:  Immediate;    Fair Recent;   Fair  Judgement:  Fair  Insight:  Shallow  Psychomotor Activity:  Decreased  Concentration:  Concentration: Fair and Attention Span: Fair  Recall:  [employer] of Knowledge:Good  Language: Good  Akathisia:  No  Handed:   AIMS (if indicated):  not done  Assets:  Desire for Improvement Housing Social Support  ADL's:  Intact  Cognition: WNL  Sleep:  Fair   Screenings: GAD-7   [place] Office Visit from 05/07/2020 in [facility] Office Visit from 12/26/2019 in [place] Office Visit from 04/17/2017 in [place] Visit from 01/17/2016 in [place] Office Visit from 01/15/2016 in [place]  Total GAD-7 Score 13 5 6 10 11     PHQ2-9   [place] Office Visit from 05/07/2020 in [place] Video Visit from 04/12/2020 in [place] Counselor from 04/02/2020 in [place] Office Visit from 12/26/2019 in [place] Office Visit from 04/17/2017 in [place]  PHQ-2 Total Score 4 2 1 3 2   PHQ-9 Total Score 14 14 -- 8 7    Flowsheet Row Video Visit from 04/12/2020 in [place] Counselor from 04/02/2020 in [place] No Risk No Risk      Assessment and Plan: as follows  Major depressive disorder recurrent moderate; feels decrease motivation, will wait for sleep study results or treatment as it may be contributing  Lower prozac to 10mg , felt anxious at 20mg  Continue wellbutrin Generalized anxiety disorder;  Worries are there, continue buspar  And prozac as above  add activities and therapy  Fu 13m    Merian Capron, MD 4/7/20224:10 PM

## 2020-05-18 ENCOUNTER — Telehealth: Payer: Self-pay

## 2020-05-18 ENCOUNTER — Other Ambulatory Visit: Payer: Self-pay | Admitting: Family Medicine

## 2020-05-18 MED ORDER — NIRMATRELVIR/RITONAVIR (PAXLOVID)TABLET: 3.0000 | ORAL_TABLET | Freq: Two times a day (BID) | ORAL | 0 refills | Status: AC

## 2020-05-18 NOTE — Telephone Encounter (Signed)
Recommend supportive care with increased fluids and rest.  I have sent rx for Paxlovid in as well.  Call back if symptoms worsen.    CM

## 2020-05-18 NOTE — Telephone Encounter (Signed)
Patient advised of Dr Zigmund Daniel recommendations.   Also advised -   People with COVID-19 have had a wide range of symptoms reported - ranging from mild symptoms to severe illness. Symptoms may appear 2-14 days after exposure to the virus. People with these symptoms may have COVID-19: Fever or chills Cough Shortness of breath or difficulty breathing Fatigue Muscle or body aches Headache New loss of taste or smell Sore throat Congestion or runny nose Nausea or vomiting Diarrhea  When to Seek Emergency Medical Attention Look for emergency warning signs* for COVID-19. If someone is showing any of these signs, seek emergency medical care immediately Trouble breathing Persistent pain or pressure in the chest New confusion Inability to wake or stay awake Bluish lips or face

## 2020-05-18 NOTE — Telephone Encounter (Signed)
Tyler Hancock called and states he tested positive for Covid today. His symptoms started yesterday. He reports congestion, fever, sore throat, headache and numbness in feet and hands. Denies cough. He wanted to know if there was a treatment for new onset Covid.   No openings today.

## 2020-05-24 ENCOUNTER — Other Ambulatory Visit: Payer: Self-pay | Admitting: Physician Assistant

## 2020-06-04 DIAGNOSIS — M5020 Other cervical disc displacement, unspecified cervical region: Secondary | ICD-10-CM | POA: Diagnosis not present

## 2020-06-27 ENCOUNTER — Other Ambulatory Visit: Payer: Self-pay | Admitting: Family Medicine

## 2020-06-27 DIAGNOSIS — F411 Generalized anxiety disorder: Secondary | ICD-10-CM

## 2020-07-05 ENCOUNTER — Ambulatory Visit (INDEPENDENT_AMBULATORY_CARE_PROVIDER_SITE_OTHER): Payer: BC Managed Care – PPO | Admitting: Neurology

## 2020-07-05 ENCOUNTER — Encounter: Payer: Self-pay | Admitting: Neurology

## 2020-07-05 VITALS — BP 166/90 | HR 98 | Ht 75.0 in | Wt >= 6400 oz

## 2020-07-05 DIAGNOSIS — R351 Nocturia: Secondary | ICD-10-CM | POA: Diagnosis not present

## 2020-07-05 DIAGNOSIS — R7989 Other specified abnormal findings of blood chemistry: Secondary | ICD-10-CM

## 2020-07-05 DIAGNOSIS — G4719 Other hypersomnia: Secondary | ICD-10-CM

## 2020-07-05 DIAGNOSIS — G4733 Obstructive sleep apnea (adult) (pediatric): Secondary | ICD-10-CM

## 2020-07-05 DIAGNOSIS — Z6841 Body Mass Index (BMI) 40.0 and over, adult: Secondary | ICD-10-CM

## 2020-07-05 NOTE — Patient Instructions (Addendum)
Thank you for choosing [facility] for your sleep related care! It was nice to meet you today! I appreciate that you entrust me with your sleep related healthcare concerns. I hope, I was able to address at least some of your concerns today, and that I can help you feel reassured and also get better.    Here is what we discussed today and what we came up with as our plan for you:    Based on your symptoms and your exam I believe you are still at risk for obstructive sleep apnea and would benefit from reevaluation as it has been over 5 years and you been able to use your CPAP machine.  You may benefit from a different type of machine as we discussed.  We can also explore different treatment options including surgical and dental treatment options but for now, we will need an updated diagnosis and see how severe your sleep apnea is.    We should proceed with a sleep study.   Please remember, the risks and ramifications of moderate to severe obstructive sleep apnea or OSA are: Cardiovascular disease, including congestive heart failure, stroke, difficult to control hypertension, arrhythmias, and even type 2 diabetes has been linked to untreated OSA. Sleep apnea causes disruption of sleep and sleep deprivation in most cases, which, in turn, can cause recurrent headaches, problems with memory, mood, concentration, focus, and vigilance. Most people with untreated sleep apnea report excessive daytime sleepiness, which can affect their ability to drive. Please do not drive if you feel sleepy.   I will likely see you back after your sleep study to go over the test results and where to go from there. We will call you after your sleep study to advise about the results (most likely, you will hear from [place], my nurse) and to set up an appointment at the time, as necessary.    Our sleep lab administrative assistant will call you to schedule your sleep study. If you don't hear back from her by about  2 weeks from now, please feel free to call her at [phone]. You can leave a message with your phone number and concerns, if you get the voicemail box. She will call back as soon as possible.

## 2020-07-05 NOTE — Progress Notes (Signed)
Subjective:    Patient ID: Tyler Hancock is a 40 y.o. male.  HPI     Star Age, MD, PhD [facility] [address] [place], [place] [address]  Dear Dr. Zigmund Daniel,   I saw your patient, Tyler Hancock, upon your kind request, in my Sleep Clinic today for initial consultation of his sleep disorder, in particular, concern for underlying obstructive sleep apnea.  The patient is unaccompanied today.  As you know, Tyler Hancock is a 40 year old right-handed gentleman with an underlying medical history of Graves' disease, hypothyroidism, hypogonadism, chronic sinus issues, reflux disease, anxiety, depression, migraine headaches, and morbid obesity with a BMI of over 50, who was previously diagnosed with obstructive sleep apnea and placed on CPAP therapy.  He has not been consistent with his CPAP usage.  I reviewed the office note from 05/16/2020.  He was originally diagnosed with obstructive sleep apnea several years ago.  When he was still living in [place], he had radiofrequency ablation of the tongue which helped for about 4 to 8 months.  He had a home sleep test some 5 or 6 years ago in [place] and was diagnosed with moderate obstructive sleep apnea, I do not have those records available for review.  He was placed on either CPAP or AutoPap at the time and had difficulty tolerating the mask, most recently he tried a F 30i fullface mask.  Prior to that he tried nasal pillows and several other masks.  The latest mask was the most tolerable.  He did have significant leak, owing in part to facial hair he admits.  He has struggled with his weight for years.  He had evaluation for bariatric surgery even at 1 point in [place] but did not pursue it at the time as it was not conducive to his work life and family life.  He has a family history of obesity.  He suspects that he has a family history of sleep apnea but no one has been formally diagnosed. His wife has  reported to having apneic pauses and he believes that she is noticing worsening of his sleep apnea.  His weight has fluctuated a little bit.  He lives with his wife and 36-year-old daughter.  He works from home.  He has 3D goals in the house and to aquariums.  Bedtime is generally between 930 and 10:30 PM and rise time around 6:30 AM.  He sees psychiatry for his depression and anxiety and recently had a change from sertraline to fluoxetine about a month or 2 ago.  He has nocturia about once per average night and denies recurrent morning headaches.  He does not wake up rested.  He would like to explore other treatment options for sleep apnea. He reports that a friend of his who is a PA referred him to [facility] to an ENT.  I was able to review a phone conversation, patient talked to Dr. Hendricks Limes with [facility] ENT regarding inspire.  He was advised that given his weight that he would not be a candidate currently for the hypoglossal nerve stimulator.  He was encouraged to work on [employer]. He is a non-smoker.  He drinks alcohol once every other day or so, he drinks coffee about 2 cups in the mornings as far as caffeine intake.   His Past Medical History Is Significant For: Past Medical History:  Diagnosis Date  . Chronic sinus complaints   . 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 GAD (generalized  anxiety disorder) 12/19/2015  . GERD (gastroesophageal reflux disease)   . Graves disease   . Hypertension   . Hypotestosteronism 06/13/2014  . Major depression 06/13/2014  . Migraine variant   . Morbid obesity ([place]) 06/13/2014  . OSA (obstructive sleep apnea)   . Thyrotoxic crisis    hx of    His Past Surgical History Is Significant For: Past Surgical History:  Procedure Laterality Date  . ADENOIDECTOMY    . ANTERIOR CERVICAL DECOMP/DISCECTOMY FUSION  10/06/2018   C6-C7  . REMOVAL OF TISSUE BILATERAL EUSTACHIAN TUBE DILATION ; Surgeon: Ileene Rubens, MD; Location: [facility]; Service  08/30/2014   . SEPTOPLASTY, BILATERAL ENDOSCOPIC MIDDLE MEATAL ANTROSTOMY  08/30/2014  . TONGUE SURGERY      His Family History Is Significant For: Family History  Problem Relation Age of Onset  . Schizophrenia Maternal Grandfather   . Colon cancer Maternal Grandfather   . Early death Maternal Grandfather   . Diabetes Father   . Colon cancer Paternal Uncle   . Early death Paternal Uncle   . Depression Mother   . Stroke Paternal Grandmother     His Social History Is Significant For: Social History   Socioeconomic History  . Marital status: Married    Spouse name: Tyler Hancock  . Number of children: 1  . Years of education: BA  . Highest education level: Not on file  Occupational History  . Not on file  Tobacco Use  . Smoking status: Never Smoker  . Smokeless tobacco: Never Used  Substance and Sexual Activity  . Alcohol use: Yes    Alcohol/week: 0.0 standard drinks    Comment: 1 drink every evening  . Drug use: No  . Sexual activity: Yes  Other Topics Concern  . Not on file  Social History Narrative   Right handed   Caffeine use: 2 cup coffee per day, 1 soda per day   Social Determinants of Health   Financial Resource Strain: Not on file  Food Insecurity: Not on file  Transportation Needs: Not on file  Physical Activity: Not on file  Stress: Not on file  Social Connections: Not on file    His Allergies Are:  Allergies  Allergen Reactions  . Nickel Other (See Comments)    Skin irritation  :   His Current Medications Are:  Outpatient Encounter Medications as of 07/05/2020  Medication Sig  . AMBULATORY NON FORMULARY MEDICATION Change Continuous positive airway pressure (CPAP) machine setting to 11 cm of H2O pressure, with 15 min ramp. Send 30 day report after 30 days of use.  . B-D 3CC LUER-LOK SYR 23GX1" 23G X 1" 3 ML MISC SMARTSIG:1 Syringe(s) IM Once a Week  . buPROPion (WELLBUTRIN XL) 150 MG 24 hr tablet TAKE 3 TABLETS (450 MG TOTAL) DAILY  . busPIRone (BUSPAR)  10 MG tablet TAKE 1 TABLET BY MOUTH ONCE DAILY AS NEEDED  . FLUoxetine (PROZAC) 10 MG capsule Take 1 capsule (10 mg total) by mouth daily.  Marland Kitchen levothyroxine (SYNTHROID) 137 MCG tablet Take 274 mcg by mouth daily.  Marland Kitchen NEEDLE, DISP, 18 G (B-D HYPODERMIC NEEDLE 18GX1.5") 18G X 1-1/2" MISC USE AS DIRECTED to draw up testosterone  . omeprazole (PRILOSEC) 40 MG capsule Take 1 capsule (40 mg total) by mouth daily.  . SYRINGE DISPOSABLE 3CC 3 ML MISC Use 1 syringe per week with testosterone injection.  Marland Kitchen testosterone cypionate (DEPOTESTOSTERONE CYPIONATE) 200 MG/ML injection Inject 0.75 mLs (150 mg total) into the muscle once a week. (  Patient taking differently: Inject 150 mg into the muscle once a week. 31ml per week)  . valsartan (DIOVAN) 320 MG tablet Take 1 tablet (320 mg total) by mouth daily.  . [DISCONTINUED] gabapentin (NEURONTIN) 300 MG capsule One tab PO qHS for a week, then BID for a week, then TID. May double weekly to a max of 3,600mg /day  . [DISCONTINUED] meloxicam (MOBIC) 15 MG tablet Take 1 tablet (15 mg total) by mouth daily as needed for pain.  . [DISCONTINUED] Semaglutide-Weight Management 0.5 MG/0.5ML SOAJ Inject 0.5 mg into the skin once a week for 28 days.  . [DISCONTINUED] Semaglutide-Weight Management 1 MG/0.5ML SOAJ Inject 1 mg into the skin once a week for 28 days.  . [DISCONTINUED] sertraline (ZOLOFT) 100 MG tablet TAKE 2 TABLETS (200MG  TOTAL) DAILY  . [DISCONTINUED] Testosterone Enanthate (XYOSTED) 100 MG/0.5ML SOAJ Inject into the skin.   No facility-administered encounter medications on file as of 07/05/2020.  :   Review of Systems:  Out of a complete 14 point review of systems, all are reviewed and negative with the exception of these symptoms as listed below: Review of Systems  Neurological:       RM 1,alone. Internal referral from MATTHEWS, CODY DO, for OSA. First set up for CPAP: about 4-5 years ago. Stopped using machine: 2-3 years ago. Wants to talk about alternative  treatment to CPAP. He has problems with mask staying in place. Does not want to shave facial hair. Has tried foam soap, but ineffective.    Objective:  Neurological Exam  Physical Exam Physical Examination:   Vitals:   07/05/20 1513  BP: (!) 166/90  Pulse: 98  SpO2: 98%    General Examination: The patient is a very pleasant 40 y.o. male in no acute distress. He appears well-developed and well-nourished and well groomed.   HEENT: Normocephalic, atraumatic, pupils are equal, round and reactive to light, extraocular tracking is good without limitation to gaze excursion or nystagmus noted. Hearing is grossly intact. Face is symmetric with normal facial animation. Speech is clear with no dysarthria noted. There is no hypophonia. There is no lip, neck/head, jaw or voice tremor. Neck is supple with full range of passive and active motion. There are no carotid bruits on auscultation. Oropharynx exam reveals: mild mouth dryness, adequate dental hygiene and moderate airway crowding, due to thicker soft palate, Mallampati class IV, uvula and tonsils not fully visualized, larger tongue noted.  Neck circumference of 19 7/8 inches.  Tongue protrudes centrally and palate elevates symmetrically. Full beard.  Chest: Clear to auscultation without wheezing, rhonchi or crackles noted.  Heart: S1+S2+0, regular and normal without murmurs, rubs or gallops noted.   Abdomen: Soft, non-tender and non-distended with normal bowel sounds appreciated on auscultation.  Extremities: There is no pitting edema in the distal lower extremities bilaterally.   Skin: Warm and dry without trophic changes noted.   Musculoskeletal: exam reveals no obvious joint deformities, tenderness or joint swelling or erythema.   Neurologically:  Mental status: The patient is awake, alert and oriented in all 4 spheres. His immediate and remote memory, attention, language skills and fund of knowledge are appropriate. There is no evidence  of aphasia, agnosia, apraxia or anomia. Speech is clear with normal prosody and enunciation. Thought process is linear. Mood is normal and affect is normal.  Cranial nerves II - XII are as described above under HEENT exam.  Motor exam: Normal bulk, strength and tone is noted. There is no tremor. Fine motor skills and  coordination: grossly intact.  Cerebellar testing: No dysmetria or intention tremor. There is no truncal or gait ataxia.  Sensory exam: intact to light touch in the upper and lower extremities.  Gait, station and balance: He stands easily. No veering to one side is noted. No leaning to one side is noted. Posture is age-appropriate and stance is narrow based. Gait shows normal stride length and normal pace. No problems turning are noted.   Assessment and Plan:   In summary, Tyler Hancock is a very pleasant 40 y.o.-year old male with an underlying medical history of Graves' disease, hypothyroidism, hypogonadism, chronic sinus issues, reflux disease, anxiety, depression, migraine headaches, and morbid obesity with a BMI of over 50, who presents for evaluation of his obstructive sleep apnea.  He was diagnosed several years ago with obstructive sleep apnea and had radiofrequency cath ablation in the past while still residing in [place].  He has been on CPAP or AutoPap therapy in the past but has not used his machine in at least 2 to 3 years.  He has had discomfort with the mask, has tried multiple different masks and had issues with air leaking.  He has had some weight fluctuation.   I had a long chat with the patient about my findings and the diagnosis of OSA, its prognosis and treatment options. We talked about medical treatments, surgical interventions and non-pharmacological approaches. I explained in particular the risks and ramifications of untreated moderate to severe OSA, especially with respect to developing cardiovascular disease down the Road, including congestive heart failure,  difficult to treat hypertension, cardiac arrhythmias, or stroke. Even type 2 diabetes has, in part, been linked to untreated OSA. Symptoms of untreated OSA include daytime sleepiness, memory problems, mood irritability and mood disorder such as depression and anxiety, lack of energy, as well as recurrent headaches, especially morning headaches. We talked about trying to maintain a healthy lifestyle in general, as well as the importance of weight control. We also talked about the importance of good sleep hygiene. I recommended the following at this time: sleep study to reevaluate his obstructive sleep apnea. I explained the sleep test procedure to the patient and also outlined the difference between a laboratory attended sleep study and home sleep test.  We talked about surgical and nonsurgical treatment options including the use of a custom-made dental device (which would require a referral to a specialist dentist or oral surgeon), upper airway surgical options, such as traditional UPPP or a novel less invasive surgical option in the form of Inspire hypoglossal nerve stimulation (which would involve a referral to an ENT surgeon).  He had a phone conversation with an ENT specialist recently and was advised that he was currently not a candidate for inspire due to his weight.  We talked about this as well today.  He is encouraged to continue to work on weight loss and start his new injectable medication that he has been prescribed.  In addition, he may benefit from seeing a pain management medical clinic.  He is encouraged to talk to about this as well.   He would be willing to try positive airway pressure.  I advised him that there may be different treatment modalities such as a BiPAP that may make treatment a little bit more tolerable for him.  We will pick up our discussion after testing.  I answered all his questions today and he was in agreement.    Thank you very much for allowing me to participate in the  care of this nice patient. If I can be of any further assistance to you please do not hesitate to call me at [phone].  Sincerely,   Star Age, MD, PhD

## 2020-07-30 ENCOUNTER — Other Ambulatory Visit (HOSPITAL_COMMUNITY): Payer: Self-pay | Admitting: Psychiatry

## 2020-08-01 ENCOUNTER — Telehealth (HOSPITAL_COMMUNITY): Payer: BC Managed Care – PPO | Admitting: Psychiatry

## 2020-08-01 ENCOUNTER — Ambulatory Visit (INDEPENDENT_AMBULATORY_CARE_PROVIDER_SITE_OTHER): Payer: BC Managed Care – PPO | Admitting: Neurology

## 2020-08-01 DIAGNOSIS — Z6841 Body Mass Index (BMI) 40.0 and over, adult: Secondary | ICD-10-CM

## 2020-08-01 DIAGNOSIS — R7989 Other specified abnormal findings of blood chemistry: Secondary | ICD-10-CM

## 2020-08-01 DIAGNOSIS — G4719 Other hypersomnia: Secondary | ICD-10-CM

## 2020-08-01 DIAGNOSIS — R351 Nocturia: Secondary | ICD-10-CM

## 2020-08-01 DIAGNOSIS — G4733 Obstructive sleep apnea (adult) (pediatric): Secondary | ICD-10-CM | POA: Diagnosis not present

## 2020-08-02 NOTE — Progress Notes (Signed)
See procedure note.

## 2020-08-06 ENCOUNTER — Encounter: Payer: Self-pay | Admitting: Neurology

## 2020-08-06 NOTE — Procedures (Signed)
   [facility]) REPORT  STUDY DATE: 08-01-20  DOB: Mar 05, 1980  MRN: [id number]  ORDERING CLINICIAN: Star Age, MD, PhD   REFERRING CLINICIAN: Luetta Nutting, DO   CLINICAL INFORMATION/HISTORY: 40 year old man with a history of Graves' disease, hypothyroidism, hypogonadism, chronic sinus issues, reflux disease, anxiety, depression, migraine headaches, and morbid obesity with a BMI of over 44, who was previously diagnosed with obstructive sleep apnea and placed on CPAP therapy.  He presents for reevaluation.    Epworth sleepiness score: N/A  BMI: 50.7 kg/m  Neck Circumference: 19-7/8"  FINDINGS:   Sleep Summary:   Total Recording Time (hours, min): 7 h 58 min  Total Sleep Time (hours, min):  7 h 21 min   Percent REM (%):    18.58%  Respiratory Indices:   Calculated pAHI (per hour):  24.0/hour         REM pAHI:    38.6/hour       NREM pAHI: 21.6/hour  Oxygen Saturation Statistics:    Oxygen Saturation (%) Mean: 95%   Minimum oxygen saturation (%):                 87%   O2 Saturation Range (%): 87-100%    O2 Saturation (minutes) <=88%: 0.4 min  Pulse Rate Statistics:   Pulse Mean (bpm):    81/min    Pulse Range (81 - 122/min)   IMPRESSION: OSA (obstructive sleep apnea)  RECOMMENDATION:  This home sleep test demonstrates moderate obstructive sleep apnea with a total AHI of 24/hour and O2 nadir of 87%. Treatment with positive airway pressure is recommended. The patient will be advised to proceed with an autoPAP titration/trial at home for now. A full night titration study may be considered to optimize treatment settings, if needed down the road. Please note that untreated obstructive sleep apnea may carry additional perioperative morbidity. Patients with significant obstructive sleep apnea should receive perioperative PAP therapy and the surgeons and particularly the anesthesiologist should be informed of the diagnosis and  the severity of the sleep disordered breathing. The patient should be cautioned not to drive, work at heights, or operate dangerous or heavy equipment when tired or sleepy. Review and reiteration of good sleep hygiene measures should be pursued with any patient. Other causes of the patient's symptoms, including circadian rhythm disturbances, an underlying mood disorder, medication effect and/or an underlying medical problem cannot be ruled out based on this test. Clinical correlation is recommended. The patient and his referring provider will be notified of the test results. The patient will be seen in follow up in sleep clinic at [facility].  I certify that I have reviewed the raw data recording prior to the issuance of this report in accordance with the standards of the American Academy of Sleep Medicine (AASM).  INTERPRETING PHYSICIAN:   Star Age, MD, PhD  Board Certified in Neurology and Sleep Medicine  [facility] [address], [place], [place] [address] [phone]

## 2020-08-07 ENCOUNTER — Telehealth: Payer: Self-pay | Admitting: Neurology

## 2020-08-07 NOTE — Telephone Encounter (Signed)
Please see my chart message for reference.  Based on his home sleep test I can prescribe an AutoPap machine which is similar to the machine he currently has. As far as treatment with a BiPAP machine, for this, we will need to bring him back for full night titration study which is an overnight laboratory attended sleep study for treatment purposes.  This may or may not be covered by his insurance currently unless he tries the AutoPap first.  If he would like to consider BiPAP therapy, we can request insurance authorization for a sleep study with CPAP/BiPAP titration.  We could see if the insurance covers it.  Alternative treatment for sleep apnea in his case may be in the form of a dental device for which he would need to consult with a dentist.  He can check with his own dentist for treatment of sleep apnea or we can certainly make a referral to dentistry for this.

## 2020-08-07 NOTE — Telephone Encounter (Signed)
See mychart message from 08/07/20. 

## 2020-08-09 ENCOUNTER — Telehealth: Payer: Self-pay

## 2020-08-09 DIAGNOSIS — G4733 Obstructive sleep apnea (adult) (pediatric): Secondary | ICD-10-CM

## 2020-08-09 NOTE — Telephone Encounter (Signed)
I have entered an order for AutoPap therapy.  Please send to DME company and arrange for follow-up in sleep clinic within 31 to 89 days after set up.

## 2020-08-09 NOTE — Telephone Encounter (Signed)
See mychart messages from 6/27-6/30 for reference. Based of hst, pt advised to start on autopap. Pt is agreeable to doing so and has a machine at home he is willing to use. [facility] has confirmed pt's current machine can be set to the auto-pap settings and can assist him with supplies and set up.  Will confirm with Dr about the pressure setting. Pt is agreeable to starting back on this machine.

## 2020-08-09 NOTE — Telephone Encounter (Addendum)
See mychart message from 08/09/20.

## 2020-08-20 DIAGNOSIS — G4733 Obstructive sleep apnea (adult) (pediatric): Secondary | ICD-10-CM | POA: Diagnosis not present

## 2020-08-26 ENCOUNTER — Other Ambulatory Visit (HOSPITAL_COMMUNITY): Payer: Self-pay | Admitting: Psychiatry

## 2020-08-26 ENCOUNTER — Other Ambulatory Visit: Payer: Self-pay | Admitting: Family Medicine

## 2020-08-30 ENCOUNTER — Encounter (HOSPITAL_COMMUNITY): Payer: Self-pay | Admitting: Psychiatry

## 2020-08-30 ENCOUNTER — Telehealth (HOSPITAL_COMMUNITY): Payer: BC Managed Care – PPO | Admitting: Psychiatry

## 2020-08-30 ENCOUNTER — Telehealth (INDEPENDENT_AMBULATORY_CARE_PROVIDER_SITE_OTHER): Payer: BC Managed Care – PPO | Admitting: Psychiatry

## 2020-08-30 DIAGNOSIS — R5383 Other fatigue: Secondary | ICD-10-CM

## 2020-08-30 DIAGNOSIS — F331 Major depressive disorder, recurrent, moderate: Secondary | ICD-10-CM | POA: Diagnosis not present

## 2020-08-30 DIAGNOSIS — F411 Generalized anxiety disorder: Secondary | ICD-10-CM | POA: Diagnosis not present

## 2020-08-30 MED ORDER — BUSPIRONE HCL 10 MG PO TABS: 10.0000 mg | ORAL_TABLET | Freq: Every day | ORAL | 0 refills | Status: DC | PRN

## 2020-08-30 NOTE — Progress Notes (Signed)
[facility] Follow up visit  Patient Identification: Tyler Hancock MRN:  [id number] Date of Evaluation:  08/30/2020 Referral Source: primary care Chief Complaint:  establish care, depression Visit Diagnosis:    ICD-10-CM   1. Major depressive disorder, recurrent episode, moderate with anxious distress ([place])  F33.1     2. GAD (generalized anxiety disorder)  F41.1 busPIRone (BUSPAR) 10 MG tablet    3. Tiredness  R53.83     Virtual Visit via Video Note  I connected with Tyler Hancock on 08/30/20 at  4:30 PM EDT by a video enabled telemedicine application and verified that I am speaking with the correct person using two identifiers.  Location: Patient: home Provider: office   I discussed the limitations of evaluation and management by telemedicine and the availability of in person appointments. The patient expressed understanding and agreed to proceed.      I discussed the assessment and treatment plan with the patient. The patient was provided an opportunity to ask questions and all were answered. The patient agreed with the plan and demonstrated an understanding of the instructions.   The patient was advised to call back or seek an in-person evaluation if the symptoms worsen or if the condition fails to improve as anticipated.  I provided 15 minutes of non-face-to-face time during this encounter.       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  Mood wise doing better on the Wellbutrin, BuSpar, Prozac now at a dose of 10 mg some sleep issues despite now using a CPAP machine he will communicate with the sleep specialist and consider BiPAP machine overall tiredness is there but depression has improved he still not to be satisfied with his job work but  has to continue work for now     Aggravating factors; finances Covid pandemic Modifying factors for daughters, wife, daughter Duration for 5 years or more  Alcohol use has been drinking 2 or 3 beers or 2 glasses of wine daily has been treated in the recent months.  Was using less before      Past Psychiatric History: depression, anxiety  Previous Psychotropic Medications: Yes   Substance Abuse History in the last 12 months:  Yes.    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   GAD (generalized anxiety disorder) 12/19/2015   GERD (gastroesophageal reflux disease)    Graves disease    Hypertension    Hypotestosteronism 06/13/2014   Major depression 06/13/2014   Migraine variant    Morbid obesity ([place]) 06/13/2014   OSA (obstructive sleep apnea)    Thyrotoxic crisis    hx of    Past Surgical History:  Procedure Laterality Date   ADENOIDECTOMY     ANTERIOR CERVICAL DECOMP/DISCECTOMY FUSION  10/06/2018   C6-C7   REMOVAL OF TISSUE BILATERAL EUSTACHIAN TUBE DILATION ; Surgeon: Ileene Rubens, MD; Location: [facility] OR; Service  08/30/2014   SEPTOPLASTY, BILATERAL ENDOSCOPIC MIDDLE MEATAL ANTROSTOMY  08/30/2014   TONGUE SURGERY      Family Psychiatric History: mom depression Mom side depression Uncle schizophrenia  Family History:  Family History  Problem Relation Age of  Onset   Schizophrenia Maternal Grandfather    Colon cancer Maternal Grandfather    Early death Maternal Grandfather    Diabetes Father    Colon cancer Paternal Uncle    Early death Paternal Uncle    Depression Mother    Stroke Paternal Grandmother     Social History:   Social History   Socioeconomic History   Marital status: Married    Spouse name: Sinjin Amero   Number of children: 1   Years of education: BA   Highest  education level: Not on file  Occupational History   Not on file  Tobacco Use   Smoking status: Never   Smokeless tobacco: Never  Substance and Sexual Activity   Alcohol use: Yes    Alcohol/week: 0.0 standard drinks    Comment: 1 drink every evening   Drug use: No   Sexual activity: Yes  Other Topics Concern   Not on file  Social History Narrative   Right handed   Caffeine use: 2 cup coffee per day, 1 soda per day   Social Determinants of Health   Financial Resource Strain: Not on file  Food Insecurity: Not on file  Transportation Needs: Not on file  Physical Activity: Not on file  Stress: Not on file  Social Connections: Not on file      Allergies:   Allergies  Allergen Reactions   Nickel Other (See Comments)    Skin irritation    Metabolic Disorder Labs: Lab Results  Component Value Date   HGBA1C 5.0 02/11/2018   MPG 97 02/11/2018   MPG 94 04/15/2017   No results found for: PROLACTIN Lab Results  Component Value Date   CHOL 155 05/16/2020   TRIG 66 05/16/2020   HDL 43 05/16/2020   CHOLHDL 3.6 05/16/2020   VLDL 8 02/26/2015   LDLCALC 97 05/16/2020   LDLCALC 102 (H) 04/15/2017   Lab Results  Component Value Date   TSH 24.98 (H) 06/24/2019    Therapeutic Level Labs: No results found for: LITHIUM No results found for: CBMZ No results found for: VALPROATE  Current Medications: Current Outpatient Medications  Medication Sig Dispense Refill   AMBULATORY NON FORMULARY MEDICATION Change Continuous positive airway pressure (CPAP) machine setting to 11 cm of H2O pressure, with 15 min ramp. Send 30 day report after 30 days of use. 1 each 0   B-D 3CC LUER-LOK SYR 23GX1" 23G X 1" 3 ML MISC SMARTSIG:1 Syringe(s) IM Once a Week     buPROPion (WELLBUTRIN XL) 150 MG 24 hr tablet Take 3 tablets by mouth once daily 270 tablet 0   busPIRone (BUSPAR) 10 MG tablet Take 1 tablet (10 mg total) by mouth daily as needed. 90 tablet 0   FLUoxetine (PROZAC) 10 MG capsule  Take 1 capsule by mouth once daily 30 capsule 0   levothyroxine (SYNTHROID) 137 MCG tablet Take 274 mcg by mouth daily.     NEEDLE, DISP, 18 G (B-D HYPODERMIC NEEDLE 18GX1.5") 18G X 1-1/2" MISC USE AS DIRECTED to draw up testosterone 100 each 1   omeprazole (PRILOSEC) 40 MG capsule Take 1 capsule (40 mg total) by mouth daily. 90 capsule 3   SYRINGE DISPOSABLE 3CC 3 ML MISC Use 1 syringe per week with testosterone injection. 25 each prn   testosterone cypionate (DEPOTESTOSTERONE CYPIONATE) 200 MG/ML injection Inject 0.75 mLs (150 mg total) into the muscle once a week. (Patient taking differently: Inject 150 mg into the muscle once a week. 62ml per week)  18 mL 1   valsartan (DIOVAN) 320 MG tablet Take 1 tablet (320 mg total) by mouth daily. 90 tablet 2   No current facility-administered medications for this visit.     Psychiatric Specialty Exam: Review of Systems  Constitutional:  Positive for fatigue.  Cardiovascular:  Negative for chest pain.  Psychiatric/Behavioral:  Negative for agitation and self-injury.    There were no vitals taken for this visit.There is no height or weight on file to calculate BMI.  General Appearance: Casual  Eye Contact:  Fair  Speech:  Normal Rate  Volume:  Decreased  Mood:  Somewhat subdued  Affect:  Congruent  Thought Process:  Goal Directed  Orientation:  Full (Time, Place, and Person)  Thought Content:  Rumination  Suicidal Thoughts:  No  Homicidal Thoughts:  No  Memory:  Immediate;   Fair Recent;   Fair  Judgement:  Fair  Insight:  Shallow  Psychomotor Activity:  Decreased  Concentration:  Concentration: Fair and Attention Span: Fair  Recall:  [employer] of Knowledge:Good  Language: Good  Akathisia:  No  Handed:   AIMS (if indicated):  not done  Assets:  Desire for Improvement Housing Social Support  ADL's:  Intact  Cognition: WNL  Sleep:  Fair   Screenings: GAD-7    [place] Office Visit from 05/07/2020 in [facility]  At [facility] Office Visit from 12/26/2019 in [place] Visit from 04/17/2017 in [place] Visit from 01/17/2016 in [facility] Office Visit from 01/15/2016 in [facility] [place]  Total GAD-7 Score 13 5 6 10 11       PHQ2-9    [place] Office Visit from 05/07/2020 in [facility] Video Visit from 04/12/2020 in [facility] Counselor from 04/02/2020 in [place] Office Visit from 12/26/2019 in [place] Visit from 04/17/2017 in [facility]  PHQ-2 Total Score 4 2 1 3 2   PHQ-9 Total Score 14 14 -- 8 7      Flowsheet Row Video Visit from 08/30/2020 in [place] Video Visit from 04/12/2020 in [place] Counselor from 04/02/2020 in [place] No Risk No Risk No Risk       Assessment and Plan: as follows Prior documentation reviewed Major depressive disorder recurrent moderate; somewhat better mood wise continue Prozac 10 mg continue Wellbutrin  continue wellbutrin Generalized anxiety disorder; trying to keep himself engaged some job satisfaction makes him anxious continue Prozac 10 mg  He will follow the sleep clinic to establish possibility of using a CPAP or BiPAP machine adjustment Fu 3 months  Merian Capron, MD 7/21/20224:42 PM

## 2020-09-10 ENCOUNTER — Encounter: Payer: Self-pay | Admitting: Neurology

## 2020-09-11 ENCOUNTER — Encounter: Payer: Self-pay | Admitting: Neurology

## 2020-09-12 ENCOUNTER — Telehealth: Payer: Self-pay | Admitting: Neurology

## 2020-09-12 NOTE — Telephone Encounter (Signed)
Please see my chart message for reference.  I reviewed patient's AutoPap compliance data, start date was 08/20/2020.  He has had several skip nights but overall average usage is 6 hours and 3 minutes for days on treatment, average AHI is at goal at 2.7/h, pressure for the 95th percentile at 12.8 cm with a range of 6 to 13 cm.  Leak is on the higher side with a 95th percentile at 23.6 L/min.  Overall compliance data suggests good control of his sleep apnea.  I would encourage patient to continue with treatment at the current settings.  If he feels that the pressure needs to be a little higher, we can certainly increase the maximum pressure to 14 cm.  From what I can see in his download, his apnea control is good at this time.  Please inquire if he has any specific concerns in terms of tolerance of the pressure or the mask.  Some of the concerns can be addressed with the DME company as far as mask refit or trying a different level of humidity and change in the ramp up time.  He can troubleshoot this with his DME provider as well.  If he would like to push the top pressure a little higher, we can make a change in his AutoPap maximum pressure to 14 cm from currently at 13 cm.

## 2020-09-13 NOTE — Telephone Encounter (Signed)
Detailed message relayed to pt via mychart message.

## 2020-09-17 ENCOUNTER — Telehealth: Payer: Self-pay | Admitting: Neurology

## 2020-09-17 NOTE — Telephone Encounter (Signed)
Message sent to pt.

## 2020-09-17 NOTE — Telephone Encounter (Signed)
Please see my chart message for reference.  Please offer sooner follow-up appointment with the nurse practitioner or myself, whichever is first available.

## 2020-09-18 ENCOUNTER — Telehealth: Payer: Self-pay | Admitting: Adult Health

## 2020-09-20 NOTE — Telephone Encounter (Signed)
I called the patient and LVM (ok per DPR) asking for call back ASAP to let us know which appointment he would like.  We did have a sooner opening available for next Monday, August 15 at 9 AM.  The other option was Wednesday on the 17th at 9 AM but advised pt if he cannot take the appt we would need to have open for another patient. Left office number in message.

## 2020-09-25 ENCOUNTER — Encounter: Payer: Self-pay | Admitting: *Deleted

## 2020-09-26 ENCOUNTER — Telehealth (INDEPENDENT_AMBULATORY_CARE_PROVIDER_SITE_OTHER): Payer: BC Managed Care – PPO | Admitting: Adult Health

## 2020-09-26 DIAGNOSIS — Z9989 Dependence on other enabling machines and devices: Secondary | ICD-10-CM | POA: Diagnosis not present

## 2020-09-26 DIAGNOSIS — G4733 Obstructive sleep apnea (adult) (pediatric): Secondary | ICD-10-CM

## 2020-09-26 NOTE — Patient Instructions (Signed)
CPAP titration ordered Keep using CPAP If your symptoms worsen or you develop new symptoms please let us know.

## 2020-09-26 NOTE — Progress Notes (Addendum)
PATIENT: Tyler Hancock DOB: Jul 31, 1980  REASON FOR VISIT: follow up HISTORY FROM: patient  Virtual Visit via Video Note  I connected with Tyler Hancock on 09/26/20 at  9:00 AM EDT by a video enabled telemedicine application located remotely at [facility] and verified that I am speaking with the correct person using two identifiers who was located at their own home.   I discussed the limitations of evaluation and management by telemedicine and the availability of in person appointments. The patient expressed understanding and agreed to proceed.   PATIENT: Tyler Hancock DOB: 1980/11/05  REASON FOR VISIT: follow up HISTORY FROM: patient Primary Neurologist: Dr. Rexene Alberts  HISTORY OF PRESENT ILLNESS: Today 09/26/20:  Tyler Hancock is a 40 year old male with a history of obstructive sleep apnea on CPAP.  He returns today for follow-up.  The patient reports that he is still having a hard time tolerating the CPAP.  He states that his mouth feels dry he has not tried adjusting his humidification.  He also states that he does not feel he is getting enough air.  He tried increasing his pressure to 14 and then felt that he cannot exhale without the mask leaking.  He has tried several different mask but currently the full facemask works the best for him.  He states that he is still feeling exhausted throughout the day.  Often nodding off in meetings.     HISTORY (copied from Dr. Guadelupe Sabin note) Tyler Hancock is a 40 year old right-handed gentleman with an underlying medical history of Graves' disease, hypothyroidism, hypogonadism, chronic sinus issues, reflux disease, anxiety, depression, migraine headaches, and morbid obesity with a BMI of over 50, who was previously diagnosed with obstructive sleep apnea and placed on CPAP therapy.  He has not been consistent with his CPAP usage.  I reviewed the office note from 05/16/2020.  He was originally diagnosed with obstructive sleep  apnea several years ago.  When he was still living in [place], he had radiofrequency ablation of the tongue which helped for about 4 to 8 months.  He had a home sleep test some 5 or 6 years ago in [place] and was diagnosed with moderate obstructive sleep apnea, I do not have those records available for review.  He was placed on either CPAP or AutoPap at the time and had difficulty tolerating the mask, most recently he tried a F 30i fullface mask.  Prior to that he tried nasal pillows and several other masks.  The latest mask was the most tolerable.  He did have significant leak, owing in part to facial hair he admits.  He has struggled with his weight for years.  He had evaluation for bariatric surgery even at 1 point in [place] but did not pursue it at the time as it was not conducive to his work life and family life.  He has a family history of obesity.  He suspects that he has a family history of sleep apnea but no one has been formally diagnosed. His wife has reported to having apneic pauses and he believes that she is noticing worsening of his sleep apnea.  His weight has fluctuated a little bit.  He lives with his wife and 49-year-old daughter.  He works from home.  He has 3D goals in the house and to aquariums.  Bedtime is generally between 930 and 10:30 PM and rise time around 6:30 AM.  He sees psychiatry for his depression and anxiety and recently had a  change from sertraline to fluoxetine about a month or 2 ago.  He has nocturia about once per average night and denies recurrent morning headaches.  He does not wake up rested.  He would like to explore other treatment options for sleep apnea. He reports that a friend of his who is a PA referred him to [facility] to an ENT.  I was able to review a phone conversation, patient talked to Dr. Hendricks Limes with [facility] regarding inspire.  He was advised that given his weight that he would not be a candidate currently for the hypoglossal nerve stimulator.  He  was encouraged to work on [employer]. He is a non-smoker.  He drinks alcohol once every other day or so, he drinks coffee about 2 cups in the mornings as far as caffeine intake.   REVIEW OF SYSTEMS: Out of a complete 14 system review of symptoms, the patient complains only of the following symptoms, and all other reviewed systems are negative.  See HPI  ALLERGIES: Allergies  Allergen Reactions   Nickel Other (See Comments)    Skin irritation    HOME MEDICATIONS: Outpatient Medications Prior to Visit  Medication Sig Dispense Refill   AMBULATORY NON FORMULARY MEDICATION Change Continuous positive airway pressure (CPAP) machine setting to 11 cm of H2O pressure, with 15 min ramp. Send 30 day report after 30 days of use. 1 each 0   B-D 3CC LUER-LOK SYR 23GX1" 23G X 1" 3 ML MISC SMARTSIG:1 Syringe(s) IM Once a Week     buPROPion (WELLBUTRIN XL) 150 MG 24 hr tablet Take 3 tablets by mouth once daily 270 tablet 0   busPIRone (BUSPAR) 10 MG tablet Take 1 tablet (10 mg total) by mouth daily as needed. 90 tablet 0   FLUoxetine (PROZAC) 10 MG capsule Take 1 capsule by mouth once daily 30 capsule 0   levothyroxine (SYNTHROID) 137 MCG tablet Take 274 mcg by mouth daily.     NEEDLE, DISP, 18 G (B-D HYPODERMIC NEEDLE 18GX1.5") 18G X 1-1/2" MISC USE AS DIRECTED to draw up testosterone 100 each 1   omeprazole (PRILOSEC) 40 MG capsule Take 1 capsule (40 mg total) by mouth daily. 90 capsule 3   SYRINGE DISPOSABLE 3CC 3 ML MISC Use 1 syringe per week with testosterone injection. 25 each prn   testosterone cypionate (DEPOTESTOSTERONE CYPIONATE) 200 MG/ML injection Inject 0.75 mLs (150 mg total) into the muscle once a week. (Patient taking differently: Inject 150 mg into the muscle once a week. 9m per week) 18 mL 1   valsartan (DIOVAN) 320 MG tablet Take 1 tablet (320 mg total) by mouth daily. 90 tablet 2   No facility-administered medications prior to visit.    PAST MEDICAL HISTORY: Past Medical  History:  Diagnosis Date   Chronic sinus complaints    Drug-induced hypothyroidism 06/13/2014   GAD (generalized anxiety disorder) 12/19/2015   GERD (gastroesophageal reflux disease)    Graves disease    Hypertension    Hypotestosteronism 06/13/2014   Major depression 06/13/2014   Migraine variant    Morbid obesity ([place] 06/13/2014   OSA (obstructive sleep apnea)    Thyrotoxic crisis    hx of    PAST SURGICAL HISTORY: Past Surgical History:  Procedure Laterality Date   ADENOIDECTOMY     ANTERIOR CERVICAL DECOMP/DISCECTOMY FUSION  10/06/2018   C6-C7   REMOVAL OF TISSUE BILATERAL EUSTACHIAN TUBE DILATION ; Surgeon: OIleene Rubens MD; Location: [facility] OR; Service  08/30/2014   SEPTOPLASTY,  BILATERAL ENDOSCOPIC MIDDLE MEATAL ANTROSTOMY  08/30/2014   TONGUE SURGERY      FAMILY HISTORY: Family History  Problem Relation Age of Onset   Schizophrenia Maternal Grandfather    Colon cancer Maternal Grandfather    Early death Maternal Grandfather    Diabetes Father    Colon cancer Paternal Uncle    Early death Paternal Uncle    Depression Mother    Stroke Paternal Grandmother     SOCIAL HISTORY: Social History   Socioeconomic History   Marital status: Married    Spouse name: Tyler Hancock   Number of children: 1   Years of education: BA   Highest education level: Not on file  Occupational History   Not on file  Tobacco Use   Smoking status: Never   Smokeless tobacco: Never  Substance and Sexual Activity   Alcohol use: Yes    Alcohol/week: 0.0 standard drinks    Comment: 1 drink every evening   Drug use: No   Sexual activity: Yes  Other Topics Concern   Not on file  Social History Narrative   Right handed   Caffeine use: 2 cup coffee per day, 1 soda per day   Social Determinants of Health   Financial Resource Strain: Not on file  Food Insecurity: Not on file  Transportation Needs: Not on file  Physical Activity: Not on file  Stress: Not on file  Social Connections:  Not on file  Intimate Partner Violence: Not on file      PHYSICAL EXAM Generalized: Well developed, in no acute distress   Neurological examination  Mentation: Alert oriented to time, place, history taking. Follows all commands speech and language fluent Cranial nerve II-XII:Extraocular movements were full. Facial symmetry noted. uvula tongue midline. Head turning and shoulder shrug  were normal and symmetric. Motor: Good strength throughout subjectively per patient Sensory: Sensory testing is intact to soft touch on all 4 extremities subjectively per patient Coordination: Cerebellar testing reveals good finger-nose-finger  Gait and station: Patient is able to stand from a seated position. gait is normal.  Reflexes: UTA  DIAGNOSTIC DATA (LABS, IMAGING, TESTING) - I reviewed patient records, labs, notes, testing and imaging myself where available.  Lab Results  Component Value Date   WBC 7.9 05/16/2020   HGB 16.4 05/16/2020   HCT 48.5 05/16/2020   MCV 89.8 05/16/2020   PLT 177 05/16/2020      Component Value Date/Time   NA 139 05/16/2020 0955   K 4.1 05/16/2020 0955   CL 103 05/16/2020 0955   CO2 28 05/16/2020 0955   GLUCOSE 89 05/16/2020 0955   BUN 14 05/16/2020 0955   CREATININE 0.92 05/16/2020 0955   CALCIUM 9.2 05/16/2020 0955   PROT 7.2 05/16/2020 0955   ALBUMIN 4.3 08/27/2016 0759   AST 18 05/16/2020 0955   ALT 27 05/16/2020 0955   ALKPHOS 45 08/27/2016 0759   BILITOT 0.6 05/16/2020 0955   GFRNONAA 106 05/16/2020 0955   GFRAA 123 05/16/2020 0955   Lab Results  Component Value Date   CHOL 155 05/16/2020   HDL 43 05/16/2020   LDLCALC 97 05/16/2020   TRIG 66 05/16/2020   CHOLHDL 3.6 05/16/2020   Lab Results  Component Value Date   HGBA1C 5.0 02/11/2018   No results found for: [id number] Lab Results  Component Value Date   TSH 24.98 (H) 06/24/2019      ASSESSMENT AND PLAN 40 y.o. year old male  has a past medical history of Chronic  sinus  complaints, Drug-induced hypothyroidism (06/13/2014), GAD (generalized anxiety disorder) (12/19/2015), GERD (gastroesophageal reflux disease), Graves disease, Hypertension, Hypotestosteronism (06/13/2014), Major depression (06/13/2014), Migraine variant, Morbid obesity ([place]) (06/13/2014), OSA (obstructive sleep apnea), and Thyrotoxic crisis. here with :  1.  Obstructive sleep apnea on CPAP  -- Patient will come back in for CPAP titration --Current compliance is suboptimal -- When he is using the machine his residual AHI is in normal range -- Encouraged the patient to adjust his humidification -- Patient is not interested in a mask refitting at this time as he is already tried several different mask options --He will follow-up after his CPAP titration  I spent 75mnutes of face-to-face and non-face-to-face time with patient.  This included previsit chart review, l reviewing his sleep download discussion of CPAP titration and other treatment options for OSA  MWard Givens MSN, NP-C 09/26/2020, 9:01 AM [facility] [address] [place] [phone] I reviewed the above note and documentation by the Nurse Practitioner and agree with the history, exam, assessment and plan as outlined above. I was available for consultation. SStar Age MD, PhD [facility] ([facility]

## 2020-10-01 ENCOUNTER — Other Ambulatory Visit (HOSPITAL_COMMUNITY): Payer: Self-pay | Admitting: Psychiatry

## 2020-10-30 ENCOUNTER — Ambulatory Visit: Payer: Self-pay | Admitting: Neurology

## 2020-11-07 ENCOUNTER — Other Ambulatory Visit: Payer: Self-pay

## 2020-11-07 ENCOUNTER — Ambulatory Visit (INDEPENDENT_AMBULATORY_CARE_PROVIDER_SITE_OTHER): Payer: BC Managed Care – PPO | Admitting: Neurology

## 2020-11-07 DIAGNOSIS — G4733 Obstructive sleep apnea (adult) (pediatric): Secondary | ICD-10-CM

## 2020-11-07 DIAGNOSIS — Z9989 Dependence on other enabling machines and devices: Secondary | ICD-10-CM

## 2020-11-08 DIAGNOSIS — E890 Postprocedural hypothyroidism: Secondary | ICD-10-CM | POA: Diagnosis not present

## 2020-11-08 DIAGNOSIS — E291 Testicular hypofunction: Secondary | ICD-10-CM | POA: Diagnosis not present

## 2020-11-11 ENCOUNTER — Other Ambulatory Visit: Payer: Self-pay | Admitting: Family Medicine

## 2020-11-12 DIAGNOSIS — E890 Postprocedural hypothyroidism: Secondary | ICD-10-CM | POA: Diagnosis not present

## 2020-11-12 DIAGNOSIS — E291 Testicular hypofunction: Secondary | ICD-10-CM | POA: Diagnosis not present

## 2020-11-12 DIAGNOSIS — Z6841 Body Mass Index (BMI) 40.0 and over, adult: Secondary | ICD-10-CM | POA: Diagnosis not present

## 2020-11-13 ENCOUNTER — Telehealth: Payer: Self-pay | Admitting: Neurology

## 2020-11-13 DIAGNOSIS — G4733 Obstructive sleep apnea (adult) (pediatric): Secondary | ICD-10-CM

## 2020-11-13 NOTE — Telephone Encounter (Signed)
Patient has been on AutoPap therapy but has not been fully compliant, reporting discomfort with the pressure setting. He had a video visit with Vaughan Browner on 09/26/2020 and was advised to proceed with a titration study.  He had this on 11/07/2020.  Please advise patient that he did quite well with CPAP of 11 cm.  I would like to change his AutoPap to a CPAP pressure of 11 cm via full facemask.  We used a different mask during our titration.  Please advise patient to look out for changes that can be done remotely on his machine, we will send the order to his DME company.  Please advise patient to make a follow-up appointment to see Jinny Blossom in about 3 months, he can see her in a video visit as well.  I will copy Megan on the note.

## 2020-11-13 NOTE — Procedures (Signed)
PATIENT'S NAME:  Tyler, Hancock DOB:      09-27-1980      MR#:    [id number]     DATE OF RECORDING: 11/07/2020 REFERRING M.D.:  Luetta Nutting, DO Study Performed:   CPAP  Titration HISTORY: 40 year old man with a history of Graves' disease, hypothyroidism, hypogonadism, chronic sinus issues, reflux disease, anxiety, depression, migraine headaches, and morbid obesity with a BMI of over 45, who presents for a full night titration study to optimize his treatment and to improve tolerance.  He has been on AutoPap therapy with good AHI reduction, but suboptimal compliance with AutoPap therapy.  He has not noticed any improvement in his sleep quality or daytime somnolence. The patient's weight 402 pounds with a height of 75 (inches), resulting in a BMI of 49.9 kg/m2. The patient's neck circumference measured 19.8 inches.  CURRENT MEDICATIONS: Wellbutrin XL, Buspar, Prozac, Synthroid, Prilosec, Depotestosterone Cypionate, Diovan   PROCEDURE:  This is a multichannel digital polysomnogram utilizing the SomnoStar 11.2 system.  Electrodes and sensors were applied and monitored per AASM Specifications.   EEG, EOG, Chin and Limb EMG, were sampled at 200 Hz.  ECG, Snore and Nasal Pressure, Thermal Airflow, Respiratory Effort, CPAP Flow and Pressure, Oximetry was sampled at 50 Hz. Digital video and audio were recorded.      The patient was fitted with a large fullface mask, Vitera, from [place].  CPAP was initiated at 5 cm and titrated to a final pressure of 11 cm, AHI on the final pressure was 0/h with supine REM sleep achieved, O2 nadir of 90%.  The O2 nadir of 81% on the technical report appears to be erroneous.   Lights Out was at 22:10 and Lights On at 05:00. Total recording time (TRT) was 410 minutes, with a total sleep time (TST) of 403 minutes. The patient's sleep latency was 5 minutes. REM latency was 77.5 minutes, which is normal.  The sleep efficiency was 98.3 %.    SLEEP ARCHITECTURE: WASO (Wake after  sleep onset)  was 1 minutes.  There were 1.5 minutes in Stage N1, 262 minutes Stage N2, 13.5 minutes Stage N3 and 126 minutes in Stage REM.  The percentage of Stage N1 was .4%, Stage N2 was 65.%, Which is increased, stage N3 was 3.3% and Stage R (REM sleep) was 31.3%, which is increased.  The arousals were noted as: 27 were spontaneous, 0 were associated with PLMs, 27 were associated with respiratory events.  RESPIRATORY ANALYSIS:  There was a total of 60 respiratory events: 26 obstructive apneas, 0 central apneas and 0 mixed apneas with a total of 26 apneas and an apnea index (AI) of 3.9 /hour. There were 34 hypopneas with a hypopnea index of 5.1/hour. The patient also had 0 respiratory event related arousals (RERAs).      The total APNEA/HYPOPNEA INDEX  (AHI) was 8.9 /hour and the total RESPIRATORY DISTURBANCE INDEX was 8.9 /hour  48 events occurred in REM sleep and 12 events in NREM. The REM AHI was 22.9 /hour versus a non-REM AHI of 2.6 /hour.  The patient spent 403 minutes of total sleep time in the supine position and 0 minutes in non-supine. The supine AHI was 9.0, versus a non-supine AHI of 0.0.  OXYGEN SATURATION & C02:  The baseline 02 saturation was 96%, with the lowest being 81%. Time spent below 89% saturation equaled 15 minutes.  PERIODIC LIMB MOVEMENTS:  The patient had a total of 0 Periodic Limb Movements. The Periodic Limb Movement (PLM) index  was 0 and the PLM Arousal index was 0 /hour.  Audio and video analysis did not show any abnormal or unusual movements, behaviors, phonations or vocalizations. The patient took no bathroom breaks. The EKG was in keeping with normal sinus rhythm (NSR).  Post-study, the patient indicated that sleep was the same as usual.   DIAGNOSIS:  Obstructive Sleep Apnea   PLANS/RECOMMENDATIONS: This study demonstrates resolution of the patient's obstructive sleep apnea with CPAP therapy. I will, therefore, change the patient's treatment modality from  AutoPap to a CPAP of 11 cm via full facemask.  The patient will be reminded to be fully compliant with PAP therapy to improve sleep related symptoms and decrease long term cardiovascular risks. The patient should be reminded, that it may take up to 3 months to get fully used to using PAP with all planned sleep. The earlier full compliance is achieved, the better long term compliance tends to be. Please note that untreated obstructive sleep apnea carries additional perioperative morbidity. Patients with significant obstructive sleep apnea should receive perioperative PAP therapy and the surgeons and particularly the anesthesiologist should be informed of the diagnosis and the severity of the sleep disordered breathing. The patient should be cautioned not to drive, work at heights, or operate dangerous or heavy equipment when tired or sleepy. Review and reiteration of good sleep hygiene measures should be pursued with any patient. The patient will be seen in follow-up in the sleep clinic at [facility] for discussion of the test results, symptom and treatment compliance review, further management strategies, etc. The referring provider will be notified of the test results.  I certify that I have reviewed the entire raw data recording prior to the issuance of this report in accordance with the Standards of Accreditation of the American Academy of Sleep Medicine (AASM)  Star Age, MD,PhD [facility](Neurology and Sleep Medicine)

## 2020-11-14 NOTE — Telephone Encounter (Signed)
I called patient I relayed the results of CPAP titration study that he had Dr. Rexene Alberts , and she recommends CPAP 11 cm with full facemask. Patient verbalized understanding. I will send message to [facility] with these new orders. Made a follow-up appointment in 3 months with Jinny Blossom , NP. January 17 at 2:45 pm a MyChart video visit . He is to call back if any questions or concerns.

## 2020-11-15 ENCOUNTER — Telehealth: Payer: Self-pay | Admitting: Adult Health

## 2020-11-15 DIAGNOSIS — G4733 Obstructive sleep apnea (adult) (pediatric): Secondary | ICD-10-CM

## 2020-11-15 NOTE — Telephone Encounter (Signed)
Simone sent order over to [place].

## 2020-11-15 NOTE — Telephone Encounter (Signed)
Please let the patient know that resolution of his apnea was achieved at a set pressure of 11 cm of water via fullface mask.  An order has been placed to his DME company to change his pressure to 11 cm of water with a full facemask

## 2020-11-25 ENCOUNTER — Other Ambulatory Visit: Payer: Self-pay | Admitting: Family Medicine

## 2020-11-30 ENCOUNTER — Telehealth (HOSPITAL_COMMUNITY): Payer: BC Managed Care – PPO | Admitting: Psychiatry

## 2020-11-30 ENCOUNTER — Other Ambulatory Visit: Payer: Self-pay

## 2020-12-12 ENCOUNTER — Other Ambulatory Visit (HOSPITAL_COMMUNITY): Payer: Self-pay | Admitting: Psychiatry

## 2020-12-12 DIAGNOSIS — F411 Generalized anxiety disorder: Secondary | ICD-10-CM

## 2020-12-18 ENCOUNTER — Encounter: Payer: Self-pay | Admitting: Family Medicine

## 2020-12-18 ENCOUNTER — Ambulatory Visit (INDEPENDENT_AMBULATORY_CARE_PROVIDER_SITE_OTHER): Payer: BC Managed Care – PPO | Admitting: Family Medicine

## 2020-12-18 VITALS — BP 136/93 | HR 76 | Temp 98.9°F | Wt 387.1 lb

## 2020-12-18 DIAGNOSIS — J069 Acute upper respiratory infection, unspecified: Secondary | ICD-10-CM

## 2020-12-18 DIAGNOSIS — R6889 Other general symptoms and signs: Secondary | ICD-10-CM

## 2020-12-18 LAB — POCT INFLUENZA A/B
Influenza A, POC: NEGATIVE
Influenza B, POC: NEGATIVE

## 2020-12-18 MED ORDER — PREDNISONE 20 MG PO TABS
40.0000 mg | ORAL_TABLET | Freq: Every day | ORAL | 0 refills | Status: AC
Start: 2020-12-18 — End: 2020-12-23

## 2020-12-18 NOTE — Progress Notes (Signed)
Acute Office Visit  Subjective:    Patient ID: Tyler Hancock, male    DOB: 01/17/1981, 40 y.o.   MRN: [id number]  Chief Complaint  Patient presents with   URI    Symptoms started on 12/14/20    HPI Patient is in today for URI symptoms.   About 5 days ago patient began with watery eyes and sniffles, thought it might be allergies.  However over the next several days symptoms progressed.  He is reporting significant nasal congestion, primarily right-sided sinus pressure/pain at a scale of 5 out of 10, bilateral ear pain/pressure that is worse on the right, frequent cough that can be productive at times, fatigue, generalized body aches, sore throat, headaches.  States he just feels very worn down and the congestion is not letting up.  He has been trying to sleep more and drink lots of water.  He has been taking NyQuil, Mucinex which seems to help temporarily with symptoms.  States he feels like his symptoms are gradually worsening.  He does not have any known sick contacts.  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  He denies any fever, nausea, vomiting, diarrhea, chest pain, dyspnea, wheezing, rashes.    Past Medical History:  Diagnosis Date   Chronic sinus complaints    Drug-induced hypothyroidism 06/13/2014   GAD (generalized anxiety disorder) 12/19/2015   GERD (gastroesophageal reflux disease)    Graves disease    Hypertension    Hypotestosteronism 06/13/2014   Major depression 06/13/2014   Migraine variant    Morbid obesity ([place]) 06/13/2014   OSA (obstructive sleep apnea)    Thyrotoxic crisis    hx of    Past Surgical History:  Procedure Laterality Date   ADENOIDECTOMY     ANTERIOR CERVICAL DECOMP/DISCECTOMY FUSION  10/06/2018   C6-C7   REMOVAL OF TISSUE BILATERAL EUSTACHIAN TUBE DILATION ; Surgeon: Ileene Rubens, MD; Location: [facility] OR; Service  08/30/2014   SEPTOPLASTY, BILATERAL ENDOSCOPIC MIDDLE MEATAL ANTROSTOMY   08/30/2014   TONGUE SURGERY      Family History  Problem Relation Age of Onset   Schizophrenia Maternal Grandfather    Colon cancer Maternal Grandfather    Early death Maternal Grandfather    Diabetes Father    Colon cancer Paternal Uncle    Early death Paternal Uncle    Depression Mother    Stroke Paternal Grandmother     Social History   Socioeconomic History   Marital status: Married    Spouse name: Jourdin Connors   Number of children: 1   Years of education: BA   Highest education level: Not on file  Occupational History   Not on file  Tobacco Use   Smoking status: Never   Smokeless tobacco: Never  Substance and Sexual Activity   Alcohol use: Yes    Alcohol/week: 0.0 standard drinks    Comment: 1 drink every evening   Drug use: No   Sexual activity: Yes  Other Topics Concern   Not on file  Social History Narrative   Right handed   Caffeine use: 2 cup coffee per day, 1 soda per day   Social Determinants of Health   Financial Resource Strain: Not on file  Food Insecurity: Not on file  Transportation Needs: Not on file  Physical Activity: Not on file  Stress: Not on file  Social Connections: Not on file  Intimate Partner Violence: Not on file  Outpatient Medications Prior to Visit  Medication Sig Dispense Refill   AMBULATORY NON FORMULARY MEDICATION Change Continuous positive airway pressure (CPAP) machine setting to 11 cm of H2O pressure, with 15 min ramp. Send 30 day report after 30 days of use. 1 each 0   B-D 3CC LUER-LOK SYR 23GX1" 23G X 1" 3 ML MISC SMARTSIG:1 Syringe(s) IM Once a Week     buPROPion (WELLBUTRIN XL) 150 MG 24 hr tablet Take 3 tablets by mouth once daily 270 tablet 3   busPIRone (BUSPAR) 10 MG tablet TAKE 1 TABLET BY MOUTH ONCE DAILY AS NEEDED 90 tablet 0   FLUoxetine (PROZAC) 10 MG capsule Take 1 capsule by mouth once daily 30 capsule 0   levothyroxine (SYNTHROID) 137 MCG tablet Take 274 mcg by mouth daily.     NEEDLE, DISP, 18 G (B-D  HYPODERMIC NEEDLE 18GX1.5") 18G X 1-1/2" MISC USE AS DIRECTED to draw up testosterone 100 each 1   omeprazole (PRILOSEC) 40 MG capsule Take 1 capsule (40 mg total) by mouth daily. 90 capsule 3   SYRINGE DISPOSABLE 3CC 3 ML MISC Use 1 syringe per week with testosterone injection. 25 each prn   testosterone cypionate (DEPOTESTOSTERONE CYPIONATE) 200 MG/ML injection Inject 0.75 mLs (150 mg total) into the muscle once a week. (Patient taking differently: Inject 150 mg into the muscle once a week. 63ml per week) 18 mL 1   valsartan (DIOVAN) 320 MG tablet Take 1 tablet by mouth once daily 90 tablet 0   No facility-administered medications prior to visit.    Allergies  Allergen Reactions   Nickel Other (See Comments)    Skin irritation    Review of Systems All review of systems negative except what is listed in the HPI     Objective:    Physical Exam Constitutional:      Appearance: Normal appearance. He is obese.  HENT:     Head: Normocephalic and atraumatic.     Right Ear: Tympanic membrane normal.     Left Ear: Tympanic membrane normal.     Nose: Congestion and rhinorrhea present.     Mouth/Throat:     Mouth: Mucous membranes are moist.     Pharynx: Oropharynx is clear. No oropharyngeal exudate or posterior oropharyngeal erythema.  Eyes:     Extraocular Movements: Extraocular movements intact.     Conjunctiva/sclera: Conjunctivae normal.  Neck:     Comments: Mild cervical lymphadenopathy (submandibular) with faint discomfort on palpation Cardiovascular:     Rate and Rhythm: Normal rate and regular rhythm.     Pulses: Normal pulses.     Heart sounds: Normal heart sounds.  Pulmonary:     Effort: Pulmonary effort is normal.     Breath sounds: Normal breath sounds.  Abdominal:     Palpations: Abdomen is soft.  Musculoskeletal:        General: Normal range of motion.     Cervical back: Normal range of motion and neck supple.  Lymphadenopathy:     Cervical: Cervical adenopathy  present.  Skin:    General: Skin is warm and dry.     Capillary Refill: Capillary refill takes less than 2 seconds.     Findings: No erythema or rash.  Neurological:     General: No focal deficit present.     Mental Status: He is alert and oriented to person, place, and time. Mental status is at baseline.  Psychiatric:        Mood and Affect: Mood normal.  Behavior: Behavior normal.        Thought Content: Thought content normal.        Judgment: Judgment normal.    BP (!) 136/93 (BP Location: Left Arm, Patient Position: Sitting, Cuff Size: Large)   Pulse 76   Temp 98.9 F (37.2 C) (Oral)   Wt (!) 387 lb 1.9 oz (175.6 kg)   SpO2 96%   BMI 48.39 kg/m  Wt Readings from Last 3 Encounters:  12/18/20 (!) 387 lb 1.9 oz (175.6 kg)  07/05/20 (!) 402 lb 8 oz (182.6 kg)  05/16/20 (!) 399 lb 4.8 oz (181.1 kg)    Health Maintenance Due  Topic Date Due   HIV Screening  Never done   Hepatitis C Screening  Never done   COVID-19 Vaccine (4 - Booster for Moderna series) 02/12/2020   INFLUENZA VACCINE  09/10/2020    There are no preventive care reminders to display for this patient.   Lab Results  Component Value Date   TSH 24.98 (H) 06/24/2019   Lab Results  Component Value Date   WBC 7.9 05/16/2020   HGB 16.4 05/16/2020   HCT 48.5 05/16/2020   MCV 89.8 05/16/2020   PLT 177 05/16/2020   Lab Results  Component Value Date   NA 139 05/16/2020   K 4.1 05/16/2020   CO2 28 05/16/2020   GLUCOSE 89 05/16/2020   BUN 14 05/16/2020   CREATININE 0.92 05/16/2020   BILITOT 0.6 05/16/2020   ALKPHOS 45 08/27/2016   AST 18 05/16/2020   ALT 27 05/16/2020   PROT 7.2 05/16/2020   ALBUMIN 4.3 08/27/2016   CALCIUM 9.2 05/16/2020   Lab Results  Component Value Date   CHOL 155 05/16/2020   Lab Results  Component Value Date   HDL 43 05/16/2020   Lab Results  Component Value Date   LDLCALC 97 05/16/2020   Lab Results  Component Value Date   TRIG 66 05/16/2020   Lab  Results  Component Value Date   CHOLHDL 3.6 05/16/2020   Lab Results  Component Value Date   HGBA1C 5.0 02/11/2018       Assessment & Plan:    1. Flu-like symptoms Flu test negative in office today. - POCT Influenza A/B  2. Viral URI with cough Given the significance of his symptoms we will go ahead and give a prednisone burst.  Discussed viral versus bacterial etiology and patient is agreeable to wait a few more days before starting antibiotics.  Recommend that he continue supportive measures including over-the-counter cough/cold/analgesics with handout of recommendations given including Mucinex, Flonase, etc.  Should continue rest, hydration, humidifier use, warm compresses, steam showers.  If no improvement by the end of the week can consider antibiotics leading into the weekend. Patient aware of signs/symptoms requiring further/urgent evaluation.  - predniSONE (DELTASONE) 20 MG tablet; Take 2 tablets (40 mg total) by mouth daily with breakfast for 5 days.  Dispense: 10 tablet; Refill: 0  Follow-up if not improving by Friday or sooner if needed.  Purcell Nails Olevia Bowens, DNP, FNP-C

## 2020-12-18 NOTE — Patient Instructions (Signed)
Over the counter medications that may be helpful for symptoms:  Guaifenesin 1200 mg extended release tabs twice daily, with plenty of water For cough and congestion Brand name: Mucinex   Pseudoephedrine 30 mg, one or two tabs every 4 to 6 hours For sinus congestion Brand name: Sudafed You must get this from the pharmacy counter.  Oxymetazoline nasal spray each morning, one spray in each nostril, for NO MORE THAN 3 days  For nasal and sinus congestion Brand name: Afrin Saline nasal spray or Saline Nasal Irrigation 3-5 times a day For nasal and sinus congestion Brand names: Ocean or AYR Fluticasone nasal spray, one spray in each nostril, each morning (after oxymetazoline and saline, if used) For nasal and sinus congestion Brand name: Flonase Warm salt water gargles  For sore throat Every few hours as needed Alternate ibuprofen 400-600 mg and acetaminophen 1000 mg every 4-6 hours For fever, body aches, headache Brand names: Motrin or Advil and Tylenol Dextromethorphan 12-hour cough version 30 mg every 12 hours  For cough Brand name: Delsym Stop all other cold medications for now (Nyquil, Dayquil, Tylenol Cold, Theraflu, etc) and other non-prescription cough/cold preparations. Many of these have the same ingredients listed above and could cause an overdose of medication.   Herbal treatments that have been shown to be helpful in some patients include: Vitamin C 1000mg  per day Vitamin D 4000iU per day Zinc 100mg  per day Quercetin 25-500mg  twice a day Melatonin 5-10mg  at bedtime  General Instructions Allow your body to rest Drink PLENTY of fluids Isolate yourself from everyone, even family, until test results have returned    If you develop severe shortness of breath, uncontrolled fevers, coughing up blood, confusion, chest pain, or signs of dehydration (such as significantly decreased urine amounts or dizziness with standing) please go to the ER.

## 2021-01-07 ENCOUNTER — Other Ambulatory Visit (HOSPITAL_COMMUNITY): Payer: Self-pay | Admitting: Psychiatry

## 2021-01-22 ENCOUNTER — Ambulatory Visit (INDEPENDENT_AMBULATORY_CARE_PROVIDER_SITE_OTHER): Payer: BC Managed Care – PPO | Admitting: Sports Medicine

## 2021-01-22 ENCOUNTER — Ambulatory Visit (INDEPENDENT_AMBULATORY_CARE_PROVIDER_SITE_OTHER): Payer: BC Managed Care – PPO

## 2021-01-22 ENCOUNTER — Other Ambulatory Visit: Payer: Self-pay

## 2021-01-22 DIAGNOSIS — M170 Bilateral primary osteoarthritis of knee: Secondary | ICD-10-CM

## 2021-01-22 DIAGNOSIS — M25561 Pain in right knee: Secondary | ICD-10-CM

## 2021-01-22 DIAGNOSIS — M25562 Pain in left knee: Secondary | ICD-10-CM

## 2021-01-22 MED ORDER — MELOXICAM 15 MG PO TABS
ORAL_TABLET | ORAL | 3 refills | Status: DC
Start: 2021-01-22 — End: 2021-02-18

## 2021-01-22 NOTE — Assessment & Plan Note (Signed)
Pleasant 40 year old male, chronic bilateral knee pain, anterior, suspect patellofemoral osteoarthritis, he does get significant gelling, adding meloxicam, x-rays, home conditioning exercises, I would like him to work with his PCP on aggressive weight loss, we did discuss bariatric surgery, he will think about it, return to see me in 4 weeks, injection if no better. I will send in some tramadol while he is in [place] if need be.

## 2021-01-22 NOTE — Progress Notes (Signed)
° ° °  Procedures performed today:    None.  Independent interpretation of notes and tests performed by another provider:   None.  Brief History, Exam, Impression, and Recommendations:    Primary osteoarthritis of both knees Pleasant 40 year old male, chronic bilateral knee pain, anterior, suspect patellofemoral osteoarthritis, he does get significant gelling, adding meloxicam, x-rays, home conditioning exercises, I would like him to work with his PCP on aggressive weight loss, we did discuss bariatric surgery, he will think about it, return to see me in 4 weeks, injection if no better. I will send in some tramadol while he is in [place] if need be.  Chronic process with exacerbation and pharmacologic management  ___________________________________________ Gwen Her. Dianah Field, M.D., ABFM., CAQSM. Primary Care and Story Instructor of [place] [facility]

## 2021-01-23 ENCOUNTER — Other Ambulatory Visit (HOSPITAL_COMMUNITY): Payer: Self-pay | Admitting: Psychiatry

## 2021-01-24 ENCOUNTER — Telehealth (INDEPENDENT_AMBULATORY_CARE_PROVIDER_SITE_OTHER): Payer: BC Managed Care – PPO | Admitting: Psychiatry

## 2021-01-24 ENCOUNTER — Encounter (HOSPITAL_COMMUNITY): Payer: Self-pay | Admitting: Psychiatry

## 2021-01-24 DIAGNOSIS — F411 Generalized anxiety disorder: Secondary | ICD-10-CM

## 2021-01-24 DIAGNOSIS — F331 Major depressive disorder, recurrent, moderate: Secondary | ICD-10-CM | POA: Diagnosis not present

## 2021-01-24 DIAGNOSIS — R5383 Other fatigue: Secondary | ICD-10-CM | POA: Diagnosis not present

## 2021-01-24 MED ORDER — DULOXETINE HCL 30 MG PO CPEP: 30.0000 mg | ORAL_CAPSULE | Freq: Every day | ORAL | 0 refills | Status: DC

## 2021-01-24 NOTE — Progress Notes (Signed)
[facility] [facility] Follow up visit  Patient Identification: Tyler Hancock MRN:  [id number] Date of Evaluation:  01/24/2021 Referral Source: primary care Chief Complaint:  establish care, depression Visit Diagnosis:    ICD-10-CM   1. Major depressive disorder, recurrent episode, moderate with anxious distress ([place])  F33.1     2. GAD (generalized anxiety disorder)  F41.1     3. Tiredness  R53.83     Virtual Visit via Video Note  I connected with Tyler Hancock on 01/24/21 at  2:30 PM EST by a video enabled telemedicine application and verified that I am speaking with the correct person using two identifiers.  Location: Patient: home Provider: office   I discussed the limitations of evaluation and management by telemedicine and the availability of in person appointments. The patient expressed understanding and agreed to proceed.     I discussed the assessment and treatment plan with the patient. The patient was provided an opportunity to ask questions and all were answered. The patient agreed with the plan and demonstrated an understanding of the instructions.   The patient was advised to call back or seek an in-person evaluation if the symptoms worsen or if the condition fails to improve as anticipated.  I provided 20 minutes of non-face-to-face time during this encounter.   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  Was doing fair but recently wife had accident, stressed and feelilng subdued, decrease interest in things Also gets distracted and plans to get ADD testing done  Gets anxious and stressed Prozac at 20mg  was making hime more anxious currently is on 10mg       Aggravating factors; finances, wife had  accident Modifying factors for daughters, wife,daughter Duration for 5 years or more  Alcohol use has been drinking 2 or 3 beers or 2 glasses of wine daily has been treated in the recent months.  Was using less before      Past Psychiatric History: depression, anxiety  Previous Psychotropic Medications: Yes   Substance Abuse History in the last 12 months:  Yes.    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   GAD (generalized anxiety disorder) 12/19/2015   GERD (gastroesophageal reflux disease)    Graves disease    Hypertension    Hypotestosteronism 06/13/2014   Major depression 06/13/2014   Migraine variant    Morbid obesity ([place]) 06/13/2014   OSA (obstructive sleep apnea)    Thyrotoxic crisis    hx of    Past Surgical History:  Procedure Laterality Date   ADENOIDECTOMY     ANTERIOR CERVICAL DECOMP/DISCECTOMY FUSION  10/06/2018   C6-C7   REMOVAL OF TISSUE BILATERAL EUSTACHIAN TUBE DILATION ; Surgeon: Ileene Rubens, MD; Location: [facility] OR; Service  08/30/2014   SEPTOPLASTY, BILATERAL ENDOSCOPIC MIDDLE MEATAL ANTROSTOMY  08/30/2014   TONGUE SURGERY      Family Psychiatric History: mom depression Mom side depression Uncle schizophrenia  Family History:  Family History  Problem Relation Age of Onset   Schizophrenia Maternal Grandfather    Colon cancer Maternal Grandfather  Early death Maternal Grandfather    Diabetes Father    Colon cancer Paternal Uncle    Early death Paternal Uncle    Depression Mother    Stroke Paternal Grandmother     Social History:   Social History   Socioeconomic History   Marital status: Married    Spouse name: Tyler Hancock   Number of children: 1   Years of education: BA   Highest education level: Not on file  Occupational History   Not on file  Tobacco Use    Smoking status: Never   Smokeless tobacco: Never  Substance and Sexual Activity   Alcohol use: Yes    Alcohol/week: 0.0 standard drinks    Comment: 1 drink every evening   Drug use: No   Sexual activity: Yes  Other Topics Concern   Not on file  Social History Narrative   Right handed   Caffeine use: 2 cup coffee per day, 1 soda per day   Social Determinants of Health   Financial Resource Strain: Not on file  Food Insecurity: Not on file  Transportation Needs: Not on file  Physical Activity: Not on file  Stress: Not on file  Social Connections: Not on file      Allergies:   Allergies  Allergen Reactions   Nickel Other (See Comments)    Skin irritation    Metabolic Disorder Labs: Lab Results  Component Value Date   HGBA1C 5.0 02/11/2018   MPG 97 02/11/2018   MPG 94 04/15/2017   No results found for: PROLACTIN Lab Results  Component Value Date   CHOL 155 05/16/2020   TRIG 66 05/16/2020   HDL 43 05/16/2020   CHOLHDL 3.6 05/16/2020   VLDL 8 02/26/2015   LDLCALC 97 05/16/2020   LDLCALC 102 (H) 04/15/2017   Lab Results  Component Value Date   TSH 24.98 (H) 06/24/2019    Therapeutic Level Labs: No results found for: LITHIUM No results found for: CBMZ No results found for: VALPROATE  Current Medications: Current Outpatient Medications  Medication Sig Dispense Refill   DULoxetine (CYMBALTA) 30 MG capsule Take 1 capsule (30 mg total) by mouth daily. 30 capsule 0   AMBULATORY NON FORMULARY MEDICATION Change Continuous positive airway pressure (CPAP) machine setting to 11 cm of H2O pressure, with 15 min ramp. Send 30 day report after 30 days of use. 1 each 0   B-D 3CC LUER-LOK SYR 23GX1" 23G X 1" 3 ML MISC SMARTSIG:1 Syringe(s) IM Once a Week     buPROPion (WELLBUTRIN XL) 150 MG 24 hr tablet Take 3 tablets by mouth once daily 270 tablet 3   busPIRone (BUSPAR) 10 MG tablet TAKE 1 TABLET BY MOUTH ONCE DAILY AS NEEDED 90 tablet 0   levothyroxine (SYNTHROID) 137  MCG tablet Take 274 mcg by mouth daily.     meloxicam (MOBIC) 15 MG tablet One tab PO qAM with a meal for 2 weeks, then daily prn pain. 30 tablet 3   NEEDLE, DISP, 18 G (B-D HYPODERMIC NEEDLE 18GX1.5") 18G X 1-1/2" MISC USE AS DIRECTED to draw up testosterone 100 each 1   omeprazole (PRILOSEC) 40 MG capsule Take 1 capsule (40 mg total) by mouth daily. 90 capsule 3   SYRINGE DISPOSABLE 3CC 3 ML MISC Use 1 syringe per week with testosterone injection. 25 each prn   testosterone cypionate (DEPOTESTOSTERONE CYPIONATE) 200 MG/ML injection Inject 0.75 mLs (150 mg total) into the muscle once a week. (Patient taking differently: Inject 150 mg into  the muscle once a week. 35ml per week) 18 mL 1   valsartan (DIOVAN) 320 MG tablet Take 1 tablet by mouth once daily 90 tablet 0   No current facility-administered medications for this visit.     Psychiatric Specialty Exam: Review of Systems  Constitutional:  Positive for fatigue.  Cardiovascular:  Negative for chest pain.  Neurological:  Negative for tremors.  Psychiatric/Behavioral:  Positive for decreased concentration and dysphoric mood. Negative for agitation and self-injury.    There were no vitals taken for this visit.There is no height or weight on file to calculate BMI.  General Appearance: Casual  Eye Contact:  Fair  Speech:  Normal Rate  Volume:  Decreased  Mood:  subdued  Affect:  Congruent  Thought Process:  Goal Directed  Orientation:  Full (Time, Place, and Person)  Thought Content:  Rumination  Suicidal Thoughts:  No  Homicidal Thoughts:  No  Memory:  Immediate;   Fair Recent;   Fair  Judgement:  Fair  Insight:  Shallow  Psychomotor Activity:  Decreased  Concentration:  Concentration: Fair and Attention Span: Fair  Recall:  [employer] of Knowledge:Good  Language: Good  Akathisia:  No  Handed:   AIMS (if indicated):  not done  Assets:  Desire for Improvement Housing Social Support  ADL's:  Intact  Cognition: WNL  Sleep:   Fair   Screenings: GAD-7    [place] Office Visit from 05/07/2020 in [facility] Office Visit from 12/26/2019 in [facility] Office Visit from 04/17/2017 in [place] Visit from 01/17/2016 in [facility] Office Visit from 01/15/2016 in [facility]  Total GAD-7 Score 13 5 6 10 11       PHQ2-9    [place] Office Visit from 05/07/2020 in [facility] Video Visit from 04/12/2020 in [facility] Counselor from 04/02/2020 in [facility] Office Visit from 12/26/2019 in [facility] Office Visit from 04/17/2017 in [place]  PHQ-2 Total Score 4 2 1 3 2   PHQ-9 Total Score 14 14 -- 8 7      Flowsheet Row Video Visit from 01/24/2021 in [facility] Video Visit from 08/30/2020 in [facility] Video Visit from 04/12/2020 in [facility] No Risk No Risk No Risk       Assessment and Plan: as follows  Prior documentation reviewed  Major depressive disorder recurrent moderate; ssubdued, continue wellbutrin, change prozac to cymbalta 30mg  Consider therapy Continue cpap  Generalized anxiety disorder; stressed, continue buspar, start cymbalta, stop prozac Tiredness: continue cpap, also getting evalution for ADHD Discussed anxiety can cause distraction as well Abstain from alcohol to avoid tiredness   Fu 32m or earlier if needed  Merian Capron, MD 12/15/20222:59 PM

## 2021-02-09 DIAGNOSIS — R0789 Other chest pain: Secondary | ICD-10-CM | POA: Diagnosis not present

## 2021-02-09 DIAGNOSIS — R531 Weakness: Secondary | ICD-10-CM | POA: Diagnosis not present

## 2021-02-09 DIAGNOSIS — R42 Dizziness and giddiness: Secondary | ICD-10-CM | POA: Diagnosis not present

## 2021-02-09 DIAGNOSIS — G8191 Hemiplegia, unspecified affecting right dominant side: Secondary | ICD-10-CM | POA: Diagnosis not present

## 2021-02-09 DIAGNOSIS — Z6841 Body Mass Index (BMI) 40.0 and over, adult: Secondary | ICD-10-CM | POA: Diagnosis not present

## 2021-02-09 DIAGNOSIS — I10 Essential (primary) hypertension: Secondary | ICD-10-CM | POA: Diagnosis not present

## 2021-02-09 DIAGNOSIS — R079 Chest pain, unspecified: Secondary | ICD-10-CM | POA: Diagnosis not present

## 2021-02-09 DIAGNOSIS — R778 Other specified abnormalities of plasma proteins: Secondary | ICD-10-CM | POA: Diagnosis not present

## 2021-02-09 DIAGNOSIS — G459 Transient cerebral ischemic attack, unspecified: Secondary | ICD-10-CM | POA: Diagnosis not present

## 2021-02-09 DIAGNOSIS — G4733 Obstructive sleep apnea (adult) (pediatric): Secondary | ICD-10-CM | POA: Diagnosis not present

## 2021-02-09 DIAGNOSIS — R200 Anesthesia of skin: Secondary | ICD-10-CM | POA: Diagnosis not present

## 2021-02-09 DIAGNOSIS — Z91048 Other nonmedicinal substance allergy status: Secondary | ICD-10-CM | POA: Diagnosis not present

## 2021-02-09 DIAGNOSIS — R410 Disorientation, unspecified: Secondary | ICD-10-CM | POA: Diagnosis not present

## 2021-02-09 DIAGNOSIS — Z79899 Other long term (current) drug therapy: Secondary | ICD-10-CM | POA: Diagnosis not present

## 2021-02-09 DIAGNOSIS — G8190 Hemiplegia, unspecified affecting unspecified side: Secondary | ICD-10-CM | POA: Diagnosis not present

## 2021-02-10 DIAGNOSIS — Z91048 Other nonmedicinal substance allergy status: Secondary | ICD-10-CM | POA: Diagnosis not present

## 2021-02-10 DIAGNOSIS — R778 Other specified abnormalities of plasma proteins: Secondary | ICD-10-CM | POA: Diagnosis not present

## 2021-02-10 DIAGNOSIS — G8190 Hemiplegia, unspecified affecting unspecified side: Secondary | ICD-10-CM | POA: Diagnosis not present

## 2021-02-10 DIAGNOSIS — I10 Essential (primary) hypertension: Secondary | ICD-10-CM | POA: Diagnosis not present

## 2021-02-10 DIAGNOSIS — Z79899 Other long term (current) drug therapy: Secondary | ICD-10-CM | POA: Diagnosis not present

## 2021-02-10 DIAGNOSIS — I517 Cardiomegaly: Secondary | ICD-10-CM | POA: Diagnosis not present

## 2021-02-10 DIAGNOSIS — R42 Dizziness and giddiness: Secondary | ICD-10-CM | POA: Diagnosis not present

## 2021-02-10 DIAGNOSIS — R0789 Other chest pain: Secondary | ICD-10-CM | POA: Diagnosis not present

## 2021-02-10 DIAGNOSIS — R531 Weakness: Secondary | ICD-10-CM | POA: Diagnosis not present

## 2021-02-10 DIAGNOSIS — R410 Disorientation, unspecified: Secondary | ICD-10-CM | POA: Diagnosis not present

## 2021-02-10 DIAGNOSIS — Z6841 Body Mass Index (BMI) 40.0 and over, adult: Secondary | ICD-10-CM | POA: Diagnosis not present

## 2021-02-10 DIAGNOSIS — G4733 Obstructive sleep apnea (adult) (pediatric): Secondary | ICD-10-CM | POA: Diagnosis not present

## 2021-02-12 ENCOUNTER — Telehealth (HOSPITAL_COMMUNITY): Payer: Self-pay

## 2021-02-12 ENCOUNTER — Other Ambulatory Visit: Payer: Self-pay

## 2021-02-12 ENCOUNTER — Ambulatory Visit (INDEPENDENT_AMBULATORY_CARE_PROVIDER_SITE_OTHER): Payer: BC Managed Care – PPO | Admitting: Family Medicine

## 2021-02-12 ENCOUNTER — Encounter: Payer: Self-pay | Admitting: Family Medicine

## 2021-02-12 VITALS — BP 133/80 | HR 70 | Temp 97.6°F | Ht 75.0 in | Wt 388.0 lb

## 2021-02-12 DIAGNOSIS — G459 Transient cerebral ischemic attack, unspecified: Secondary | ICD-10-CM | POA: Diagnosis not present

## 2021-02-12 DIAGNOSIS — I10 Essential (primary) hypertension: Secondary | ICD-10-CM | POA: Diagnosis not present

## 2021-02-12 DIAGNOSIS — R079 Chest pain, unspecified: Secondary | ICD-10-CM | POA: Diagnosis not present

## 2021-02-12 DIAGNOSIS — F331 Major depressive disorder, recurrent, moderate: Secondary | ICD-10-CM

## 2021-02-12 DIAGNOSIS — R778 Other specified abnormalities of plasma proteins: Secondary | ICD-10-CM

## 2021-02-12 DIAGNOSIS — F330 Major depressive disorder, recurrent, mild: Secondary | ICD-10-CM

## 2021-02-12 LAB — COMPLETE METABOLIC PANEL WITH GFR
AG Ratio: 1.6 (calc) (ref 1.0–2.5)
ALT: 25 U/L (ref 9–46)
AST: 18 U/L (ref 10–40)
Albumin: 4.6 g/dL (ref 3.6–5.1)
Alkaline phosphatase (APISO): 62 U/L (ref 36–130)
BUN: 11 mg/dL (ref 7–25)
CO2: 28 mmol/L (ref 20–32)
Calcium: 9.4 mg/dL (ref 8.6–10.3)
Chloride: 104 mmol/L (ref 98–110)
Creat: 0.87 mg/dL (ref 0.60–1.26)
Globulin: 2.9 g/dL (calc) (ref 1.9–3.7)
Glucose, Bld: 84 mg/dL (ref 65–99)
Potassium: 4 mmol/L (ref 3.5–5.3)
Sodium: 139 mmol/L (ref 135–146)
Total Bilirubin: 0.6 mg/dL (ref 0.2–1.2)
Total Protein: 7.5 g/dL (ref 6.1–8.1)
eGFR: 113 mL/min/{1.73_m2} (ref >=60)

## 2021-02-12 LAB — CBC WITH DIFFERENTIAL/PLATELET
Absolute Monocytes: 608 cells/uL (ref 200–950)
Basophils Absolute: 49 cells/uL (ref 0–200)
Basophils Relative: 0.6 %
Eosinophils Absolute: 178 cells/uL (ref 15–500)
Eosinophils Relative: 2.2 %
HCT: 47.1 % (ref 38.5–50.0)
Hemoglobin: 16.3 g/dL (ref 13.2–17.1)
Lymphs Abs: 2576 cells/uL (ref 850–3900)
MCH: 31.3 pg (ref 27.0–33.0)
MCHC: 34.6 g/dL (ref 32.0–36.0)
MCV: 90.6 fL (ref 80.0–100.0)
MPV: 11.7 fL (ref 7.5–12.5)
Monocytes Relative: 7.5 %
Neutro Abs: 4690 cells/uL (ref 1500–7800)
Neutrophils Relative %: 57.9 %
Platelets: 179 10*3/uL (ref 140–400)
RBC: 5.2 10*6/uL (ref 4.20–5.80)
RDW: 12.4 % (ref 11.0–15.0)
Total Lymphocyte: 31.8 %
WBC: 8.1 10*3/uL (ref 3.8–10.8)

## 2021-02-12 LAB — TROPONIN I: Troponin I: 5 ng/L (ref <=47)

## 2021-02-12 MED ORDER — DULOXETINE HCL 20 MG PO CPEP: 20.0000 mg | ORAL_CAPSULE | Freq: Every day | ORAL | Status: DC

## 2021-02-12 NOTE — Telephone Encounter (Signed)
Medication management - Telephone call back with patient to review Dr. Evalina Field instructions that patient should stop Cymbalta medications, to make sure to hydrate and to schedule an earlier appointment within a week. No appts available prior to patient's already scheduled appointment for 03/06/21.  Patient wanted to make sure okay to stop dosage of Cymbalta at 30 mg and agreed if any problems prior to appointment on 03/06/21 he will call back prior.  Agreed to let Dr. De Nurse know there was no available appointments until 03/06/21 in case he has any other concerns and patient again agreed to call back if any issues stopping and being off Cymbalta medications until then.

## 2021-02-12 NOTE — Telephone Encounter (Signed)
Medication management - Telephone call back with pt to question if he had stopped Cymbalta yet and if any current dizziness or other symptoms.  Pt. reported no and that he did not think the Cymbalta had caused issues that caused him to go to the ED.Patient stated he would like to try going down on the Cymbalta to 20 mg, one a day and has already been referred to a cardiologist for follow up of his visit.  Patient agreed to call back if any problems, symptoms or possible side effects occurred with continued Cymbalta at a lower dosage.  Patient reported his last dosage of Cymbalta 30 mg was earlier today and no issues since his visit to the ED on 02/09/21.  Patient does plan to follow up with Cardiologist as they requested.  A new Cymbalta 20 mg, #30 with no refills e-scribed to patient's [place] per verbal order by Dr. De Nurse and patient instructed to stop Cymbalta 30 mg and to also call back if any issues with change and new start. Pt returns 03/06/21.

## 2021-02-12 NOTE — Progress Notes (Signed)
Tyler Hancock - 41 y.o. male MRN [id number]  Date of birth: May 17, 1980  Subjective No chief complaint on file.   HPI Tyler Hancock is a 41 year old male here today for hospital follow-up.  Recently seen in the hospital due to TIA symptoms.  Previous hospital records reviewed.  These indicate that he experienced right-sided weakness and numbness while having a bowel movement a few nights ago.  Symptoms lasted a couple of minutes and then resolved.  He was seen at the ED later that night and had work-up for TIA.  CT and MRI were unremarkable.  He had echocardiogram with bubble study which was unremarkable.  He did have elevated troponins while in the hospital.  These were trending down prior to discharge.  He was advised to follow-up with cardiology as well as neurology.  He has not heard anything regarding these appointments.  He does have follow-up with psychiatry scheduled.  Daily aspirin was added and pantoprazole was added.  He reports that he does feel better however is concerned about the cause of his symptoms.  He does continue to have intermittent episodes of chest pain which he describes as sharp with some pressure.  He does admit that the night prior to this episode he did have some aspiration of something he was drinking resulting in severe coughing episode.  He denies any shortness of breath at this time.    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   GAD (generalized anxiety disorder) 12/19/2015   GERD (gastroesophageal reflux disease)    Graves disease    Hypertension    Hypotestosteronism 06/13/2014   Major depression 06/13/2014   Migraine variant    Morbid obesity ([place]) 06/13/2014   OSA (obstructive sleep apnea)    Thyrotoxic crisis    hx of    Past Surgical  History:  Procedure Laterality Date   ADENOIDECTOMY     ANTERIOR CERVICAL DECOMP/DISCECTOMY FUSION  10/06/2018   C6-C7   REMOVAL OF TISSUE BILATERAL EUSTACHIAN TUBE DILATION ; Surgeon: Ileene Rubens, MD; Location: [facility] OR; Service  08/30/2014   SEPTOPLASTY, BILATERAL ENDOSCOPIC MIDDLE MEATAL ANTROSTOMY  08/30/2014   TONGUE SURGERY      Social History   Socioeconomic History   Marital status: Married    Spouse name: Ilias Stcharles   Number of children: 1   Years of education: BA   Highest education level: Not on file  Occupational History   Not on file  Tobacco Use   Smoking status: Never   Smokeless tobacco: Never  Substance and Sexual Activity   Alcohol use: Yes    Alcohol/week: 0.0 standard drinks    Comment: 1 drink every evening   Drug use: No   Sexual activity: Yes  Other Topics Concern   Not on file  Social History Narrative   Right handed   Caffeine use: 2 cup coffee per day, 1 soda per day   Social Determinants of Health   Financial Resource Strain: Not on file  Food Insecurity: Not on file  Transportation Needs: Not on file  Physical Activity: Not on file  Stress: Not on file  Social Connections: Not on file    Family History  Problem Relation Age of Onset   Schizophrenia Maternal Grandfather  Colon cancer Maternal Grandfather    Early death Maternal Grandfather    Diabetes Father    Colon cancer Paternal Uncle    Early death Paternal Uncle    Depression Mother    Stroke Paternal Grandmother     Health Maintenance  Topic Date Due   Pneumococcal Vaccine 21-26 Years old (1 - PCV) Never done   HIV Screening  Never done   Hepatitis C Screening  Never done   COVID-19 Vaccine (4 - Booster for Moderna series) 02/12/2020   INFLUENZA VACCINE  05/10/2021 (Originally 09/10/2020)   TETANUS/TDAP  05/31/2028   HPV VACCINES  Aged Out      ----------------------------------------------------------------------------------------------------------------------------------------------------------------------------------------------------------------- Physical Exam BP 133/80 (BP Location: Left Arm, Patient Position: Sitting, Cuff Size: Large)    Pulse 70    Temp 97.6 F (36.4 C)    Ht 6\' 3"  (1.905 m)    Wt (!) 388 lb (176 kg)    SpO2 98%    BMI 48.50 kg/m   Physical Exam Constitutional:      Appearance: Normal appearance.  Eyes:     General: No scleral icterus. Cardiovascular:     Rate and Rhythm: Normal rate and regular rhythm.  Pulmonary:     Effort: Pulmonary effort is normal.     Breath sounds: Normal breath sounds.  Chest:     Chest wall: Tenderness (Tenderness along lower sternal chondral junction.) present.  Musculoskeletal:     Cervical back: Neck supple.  Neurological:     General: No focal deficit present.     Mental Status: He is alert.  Psychiatric:        Mood and Affect: Mood normal.        Behavior: Behavior normal.    ------------------------------------------------------------------------------------------------------------------------------------------------------------------------------------------------------------------- Assessment and Plan  Essential (primary) hypertension Blood pressure remains well controlled at this time.  Continue medications at current strength  TIA (transient ischemic attack) Symptoms consistent with TIA alternatively this could be migraine variant.  Continue aspirin.  Referral placed to neurology.  Chest pain He does have tenderness along the chest wall which could be due to costochondritis.  Continue pantoprazole for management of reflux symptoms.  He did have elevated troponins in the hospital but no EKG changes and echocardiogram did not show any abnormalities.  Urgent referral placed to cardiology.  Major depression Follow-up with psychiatry  recommended   No orders of the defined types were placed in this encounter.   No follow-ups on file.    This visit occurred during the SARS-CoV-2 public health emergency.  Safety protocols were in place, including screening questions prior to the visit, additional usage of staff PPE, and extensive cleaning of exam room while observing appropriate contact time as indicated for disinfecting solutions.

## 2021-02-12 NOTE — Assessment & Plan Note (Signed)
Blood pressure remains well controlled at this time.  Continue medications at current strength

## 2021-02-12 NOTE — Patient Instructions (Addendum)
Transient Ischemic Attack A transient ischemic attack (TIA) causes stroke-like symptoms that go away quickly. Having a TIA means that a person is at higher risk for a stroke. A TIA happens when blood supply to the brain is blocked temporarily. A TIA is a medical emergency. What are the causes? This condition is caused by a temporary blockage in an artery in the head or neck. This means the brain does not get the blood supply it needs. There is no permanent brain damage with a TIA. A blockage can be caused by: Fatty buildup in an artery in the head or neck (atherosclerosis). A blood clot. An artery tear (dissection). Inflammation of an artery (vasculitis). Sometimes the cause is not known. What increases the risk? Certain factors may make you more likely to develop this condition. Some of these are things that you can change, such as: Obesity. Using products that contain nicotine or tobacco. Taking oral birth control, especially if you also use tobacco. Not being active. Heavy alcohol use. Drug use, especially cocaine and methamphetamine. Medical conditions that may increase your risk include: High blood pressure (hypertension). High cholesterol. Diabetes. Heart disease (coronary artery disease). An irregular heartbeat, also called atrial fibrillation (AFib). Sickle cell disease. Sleep problems (sleep apnea). Chronic inflammatory diseases, such as rheumatoid arthritis or lupus. Blood clotting disorders (hypercoagulable state). Other risk factors include: Being over the age of 2. Being male. Family history of stroke. Previous history of blood clots, stroke, TIA, or heart attack. Having a history of preeclampsia. Migraine headache. What are the signs or symptoms? Symptoms of a TIA are the same as those of a stroke. The symptoms develop suddenly, and then go away quickly. They may include: Weakness or numbness in your face, arm, or leg, especially on one side of your body. Trouble  walking or moving your arms or legs. Trouble speaking, understanding speech, or both (aphasia). Vision changes, such as double vision, blurred vision, or loss of vision. Dizziness. Confusion. Loss of balance or coordination. Nausea and vomiting. Severe headache. If possible, note what time your symptoms started. Tell your health care provider. How is this diagnosed? This condition may be diagnosed based on: Your symptoms and medical history. A physical exam. Imaging tests, usually a CT scan or MRI of the brain. Blood tests. You may also have other tests, including: Electrocardiogram (ECG). Echocardiogram. Carotid ultrasound. A scan of blood circulation in the brain (CT angiogram or MR angiogram). Continuous heart monitoring. How is this treated? The goal of treatment is to reduce the risk for a stroke. Stroke prevention therapies may include: Changes to diet and lifestyle, such as being physically active and stopping smoking. Medicines to thin the blood (antiplatelets or anticoagulants). Blood pressure medicines. Medicines to reduce cholesterol. Treating other health conditions, such as diabetes or AFib. If testing shows a narrowing in the arteries to your brain, your health care provider may recommend a procedure, such as: Carotid endarterectomy. This is done to remove the blockage from your artery. Carotid angioplasty and stenting. This uses a tube (stent) to open or widen an artery in the neck. The stent helps keep the artery open by supporting the artery walls. Follow these instructions at home: Medicines Take over-the-counter and prescription medicines only as told by your health care provider. If you were told to take a medicine to thin your blood, such as aspirin or an anticoagulant, take it exactly as told by your health care provider. Taking too much blood-thinning medicine can cause bleeding. Taking too little will  not protect you against a stroke and other  problems. Eating and drinking  Eat 5 or more servings of fruits and vegetables each day. Follow guidelines from your health care provider about your diet. You may need to follow a certain diet to help manage risk factors for stroke. This may include: Eating a low-fat, low-salt diet. Choosing high-fiber foods. Limiting carbohydrates and sugar. If you drink alcohol: Limit how much you have to: 0-1 drink a day for women who are not pregnant. 0-2 drinks a day for men. Know how much alcohol is in a drink. In the [place] one drink equals one 12 oz bottle of beer (363mL), one 5 oz glass of wine (111mL), or one 1 oz glass of hard liquor (5mL). General instructions Maintain a healthy weight. Try to get at least 30 minutes of exercise on most days. Get treatment if you have sleep apnea. Do not use any products that contain nicotine or tobacco. These products include cigarettes, chewing tobacco, and vaping devices, such as e-cigarettes. If you need help quitting, ask your health care provider. Do not use drugs. Keep all follow-up visits. This is important. Where to find more information American Stroke Association: www.stroke.org Get help right away if: You have chest pain or an irregular heartbeat. You have any symptoms of a stroke. "BE FAST" is an easy way to remember the main warning signs of a stroke. B - Balance. Signs are dizziness, sudden trouble walking, or loss of balance. E - Eyes. Signs are trouble seeing or a sudden change in vision. F - Face. Signs are sudden weakness or numbness of the face, or the face or eyelid drooping on one side. A - Arms. Signs are weakness or numbness in an arm. This happens suddenly and usually on one side of the body. S - Speech. Signs are sudden trouble speaking, slurred speech, or trouble understanding what people say. T - Time. Time to call emergency services. Write down what time symptoms started. You have other signs of a stroke, such as: A sudden,  severe headache with no known cause. Nausea or vomiting. Seizure. These symptoms may represent a serious problem that is an emergency. Do not wait to see if the symptoms will go away. Get medical help right away. Call your local emergency services (911 in the [place] Do not drive yourself to the hospital. Summary A transient ischemic attack (TIA) happens when an artery in the head or neck is blocked. The blockage clears before there is any permanent brain damage. A TIA is a medical emergency. Symptoms of a TIA are the same as those of a stroke. The symptoms develop suddenly, and then go away quickly. Having a TIA means that you are at higher risk for a stroke. The goal of treatment is to reduce your risk for a stroke. Treatment may include medicines to thin the blood and changes to diet and lifestyle. This information is not intended to replace advice given to you by your health care provider. Make sure you discuss any questions you have with your health care provider. Document Revised: 08/23/2019 Document Reviewed: 08/23/2019 Elsevier Patient Education  2022 [place] Costochondritis is irritation and swelling (inflammation) of the tissue that connects the ribs to the breastbone (sternum). This tissue is called cartilage. Costochondritis causes pain in the front of the chest. Usually, the pain: Starts slowly. Is in more than one rib. What are the causes? The exact cause of this condition is not always known.  It results from stress on the tissue in the affected area. The cause of this stress could be: Chest injury. Exercise or activity, such as lifting. Very bad coughing. What increases the risk? You are more likely to develop this condition if you: Are male. Are 50-62 years old. Recently started a new exercise or work activity. Have low levels of vitamin D. Have a condition that makes you cough often. What are the signs or symptoms? The main symptom of  this condition is chest pain. The pain: Usually starts slowly and can be sharp or dull. Gets worse with deep breathing, coughing, or exercise. Gets better with rest. May be worse when you press on the affected area of your ribs and breastbone. How is this treated? This condition usually goes away on its own over time. Your doctor may prescribe an NSAID, such as ibuprofen. This can help reduce pain and inflammation. Treatment may also include: Resting and avoiding activities that make pain worse. Putting heat or ice on the painful area. Doing exercises to stretch your chest muscles. If these treatments do not help, your doctor may inject a numbing medicine to help relieve the pain. Follow these instructions at home: Managing pain, stiffness, and swelling   If told, put ice on the painful area. To do this: Put ice in a plastic bag. Place a towel between your skin and the bag. Leave the ice on for 20 minutes, 2-3 times a day. If told, put heat on the affected area. Do this as often as told by your doctor. Use the heat source that your doctor recommends, such as a moist heat pack or a heating pad. Place a towel between your skin and the heat source. Leave the heat on for 20-30 minutes. Take off the heat if your skin turns bright red. This is very important if you cannot feel pain, heat, or cold. You may have a greater risk of getting burned. Activity Rest as told by your doctor. Do not do anything that makes your pain worse. This includes any activities that use chest, belly (abdomen), and side muscles. Do not lift anything that is heavier than 10 lb (4.5 kg), or the limit that you are told, until your doctor says that it is safe. Return to your normal activities as told by your doctor. Ask your doctor what activities are safe for you. General instructions Take over-the-counter and prescription medicines only as told by your doctor. Keep all follow-up visits as told by your doctor. This is  important. Contact a doctor if: You have chills or a fever. Your pain does not go away or it gets worse. You have a cough that does not go away. Get help right away if: You are short of breath. You have very bad chest pain that is not helped by medicines, heat, or ice. These symptoms may be an emergency. Do not wait to see if the symptoms will go away. Get medical help right away. Call your local emergency services (911 in the [place] Do not drive yourself to the hospital. Summary Costochondritis is irritation and swelling (inflammation) of the tissue that connects the ribs to the breastbone (sternum). This condition causes pain in the front of the chest. Treatment may include medicines, rest, heat or ice, and exercises. This information is not intended to replace advice given to you by your health care provider. Make sure you discuss any questions you have with your health care provider. Document Revised: 12/10/2018 Document Reviewed: 12/10/2018 Elsevier Patient Education  [place].

## 2021-02-12 NOTE — Assessment & Plan Note (Signed)
He does have tenderness along the chest wall which could be due to costochondritis.  Continue pantoprazole for management of reflux symptoms.  He did have elevated troponins in the hospital but no EKG changes and echocardiogram did not show any abnormalities.  Urgent referral placed to cardiology.

## 2021-02-12 NOTE — Telephone Encounter (Signed)
Medication management - Pt called, stating he ended up in the ED on 02/09/21 with dizziness, lost feeling in his right side, vision issues and chest pains.  He reported the provider there wanted him to follow up with Dr. De Nurse to see if recent medication change from Prozac to Cymbalta could have attributed to the symptoms.  Patient stated he is now better but still has some problems in the mornings and wanted to get Dr. Evalina Field opinion as they suggested.  Pt reported the troponin levels were elevated some as well and they ruled out other issues so they wanted him to follow up with Dr. De Nurse since only recently had this medication been changed.

## 2021-02-12 NOTE — Assessment & Plan Note (Addendum)
Symptoms consistent with TIA alternatively this could be migraine variant.  Continue aspirin.  Referral placed to neurology.

## 2021-02-12 NOTE — Assessment & Plan Note (Signed)
Follow-up with psychiatry recommended

## 2021-02-14 DIAGNOSIS — F419 Anxiety disorder, unspecified: Secondary | ICD-10-CM | POA: Diagnosis not present

## 2021-02-14 NOTE — Telephone Encounter (Signed)
Medication management - Called in patient's order for Cymbalta 20 mg,1 capsule daily, #30 with no refills as the order from 02/12/21 did not go through but printed.  Pharmacist filling as Dr. De Nurse ordered.

## 2021-02-18 ENCOUNTER — Encounter: Payer: Self-pay | Admitting: Neurology

## 2021-02-18 ENCOUNTER — Ambulatory Visit: Payer: BC Managed Care – PPO | Admitting: Neurology

## 2021-02-18 ENCOUNTER — Ambulatory Visit (INDEPENDENT_AMBULATORY_CARE_PROVIDER_SITE_OTHER): Payer: BC Managed Care – PPO | Admitting: Neurology

## 2021-02-18 ENCOUNTER — Telehealth: Payer: Self-pay | Admitting: Adult Health

## 2021-02-18 VITALS — BP 144/92 | HR 80 | Ht 75.0 in | Wt 392.4 lb

## 2021-02-18 DIAGNOSIS — R29818 Other symptoms and signs involving the nervous system: Secondary | ICD-10-CM

## 2021-02-18 DIAGNOSIS — G4733 Obstructive sleep apnea (adult) (pediatric): Secondary | ICD-10-CM

## 2021-02-18 DIAGNOSIS — Z789 Other specified health status: Secondary | ICD-10-CM

## 2021-02-18 DIAGNOSIS — Z9989 Dependence on other enabling machines and devices: Secondary | ICD-10-CM | POA: Diagnosis not present

## 2021-02-18 NOTE — Telephone Encounter (Signed)
Pt cancelled appt due not feeling well , have a sore throat.

## 2021-02-18 NOTE — Patient Instructions (Signed)
It was nice to see you both today.  I am glad to hear that you are feeling better.  It is possible that you had a mini stroke called TIA. As discussed, secondary prevention is key after a suspected TIA or stroke. This means: taking care of blood sugar values or diabetes management (A1c goal of less than 7.0), good blood pressure (hypertension) control and optimizing cholesterol management (with LDL goal of less than 70), exercising daily or regularly within your own mobility limitations of course, and overall cardiovascular risk factor reduction, which includes treatment of obstructive sleep apnea (OSA) and weight management.  Recommend that you continue taking a baby aspirin.  Please limit your alcohol intake to less than 1/day.  Try to hydrate well with water, 6 to 8 cups of water per day are recommended generally speaking, 8 ounce size each.  Please talk to your primary care physician about additional help with weight loss.  Please continue to use your CPAP regularly, you have increased your usage and we will request a mask fit appointment through your DME company.  You can talk to your cardiologist at the upcoming appointment exercise restrictions.  I would also recommend that you consider treating your cholesterol with cholesterol medication, such as a statin, you can talk to your primary care and/your cardiologist about this.  Your cholesterol numbers were not bad but LDL was above 80.  Please follow-up to see Vaughan Browner, NP in 6 months.

## 2021-02-18 NOTE — Telephone Encounter (Signed)
Pt called back and decided to keep the appt.

## 2021-02-18 NOTE — Progress Notes (Signed)
Subjective:    Patient ID: Tyler Hancock is a 41 y.o. male.  HPI    Interim history:   Tyler Hancock is a 41 year old right-handed gentleman with an underlying medical history of Graves' disease, hypothyroidism, hypogonadism, chronic sinus issues, reflux disease, anxiety, depression, migraine headaches, and morbid obesity with a BMI of over 50, who presents for a new problem visit of Recent TIA type symptoms.  The patient is accompanied by his wife today.  He recently presented to the emergency room with chest pain, dizziness, right arm numbness and transient right-sided weakness.  I reviewed the emergency room records from [place] on 02/09/2021.  He was admitted overnight for work-up.  He reported an event of confusion and right-sided weakness that lasted less than 5 minutes, symptoms started while he was having a bowel movement.  He had no neurological deficits on examination in the emergency room.  Echocardiogram showed mild concentric left ventricular hypertrophy.  Lipid panel showed LDL of 82.  He had a head CT without contrast on 02/09/2021 and I reviewed the results: Impression: There is no acute intracranial abnormality.  He had a CT angiogram of the head and neck on 02/09/2021 and I reviewed the results: IMPRESSION:  1.  Intracranial there is no evidence of large vessel arterial occlusion or significant stenosis.   2.  The cervical carotid arteries are patent, no significant stenosis.   3.  Please note a nondiagnostic evaluation of the distal V1 and proximal V2 segments of the bilateral vertebral arteries secondary to adjacent streak artifact from the spinal hardware. Vascular pathology these sites cannot be excluded on this examination. The vertebral arteries do appear patent both proximal and distally. If there is persistent clinical concern consider noncontrast MRI brain.   4.  Nonspecific slight asymmetric fullness of the right nasopharynx compared to the left, correlate  clinically with direct visualization to evaluate for any suspicious mucosal abnormality/lesions.   He had a brain MRI without contrast on 02/09/2021 and I reviewed the results:IMPRESSION:  No acute intracranial abnormality identified.  No acute infarct or intracranial hemorrhage identified.    I first met him at the request of his primary care physician on 07/05/2020 at which time he was evaluated for his sleep apnea.  He had previously been diagnosed with sleep apnea and was on CPAP therapy but was not consistent with his CPAP usage.  Testing was several years prior.  We proceeded with a home sleep test on 08/01/2020 which indicated moderate obstructive sleep apnea with an AHI of 24/h, O2 nadir 87%.  He was advised to start AutoPap therapy with a new machine.  He had a subsequent video visit with Vaughan Browner, NP on 09/26/2020, at which time he reported having trouble tolerating AutoPap.  He was not fully compliant with treatment, average AHI was at goal at 2.7/h, average pressure for the 95th percentile at 13.2 cm, leak on the higher side with a 95th percentile at 18.6 L/min, pressure range of 6 to 14 cm.  He was advised to proceed with a CPAP titration study. He had a CPAP titration study on 03/09/2020 which showed a sleep efficiency of 98.3%, sleep latency 5 minutes, REM latency of 77.5 minutes.  He was fitted with a large fullface mask about [employer].  CPAP was titrated from 5cm to 11 cm, final AHI was 0/h with supine REM sleep achieved and O2 nadir of 90%.  His treatment modality was changed from AutoPap to CPAP of 11 cm in October 2022.  Today, 02/18/2021: He reports being at baseline, no recent chest pain, no new neurological symptoms, in particular, no further one-sided weakness or numbness or tingling, no droopy face or slurring of speech.  He had a transient episode of right-sided weakness that lasted less than 2 minutes.  He got up in the middle of the night to use the restroom.  He is now on ASA  81 mg daily. I reviewed his CPAP compliance data from 01/19/2021 through 02/17/2021, which is a total of 30 days, during which time he used his machine 24 days with percent use days greater than 4 hours and 47%, indicating suboptimal compliance, average usage of 4 hours and 39 minutes, residual AHI 4/h, leak acceptable with a 95th percentile at 7.8 L/min on a pressure of 11 cm with EPR of 3.  He uses a F 20 fullface mask, sometimes he has to tighten the mask to avoid leakage to the point of discomfort.  Sometimes it blows air into his eyes which bothers him.  He uses the humidifier.  Sometimes he falls asleep without it he admits. He had a recent primary care visit on 02/12/2021 and I reviewed the office note.  Patient reported ongoing intermittent chest pain.  He has an appointment pending with cardiology soon. He takes a baby aspirin daily.  He is followed by psychiatry for mood disorder.  He has worried about recurrence of symptoms.  He tries to hydrate well, drinks about 3-4 large glasses of water per day, smokes cigarettes or cigars occasionally, not a daily smoker.  Drinks alcohol 2-3 times a week, up to 2-3 drinks at a time.  He is currently not exercising on a regular basis.  Previously:   07/05/20: (He) was previously diagnosed with obstructive sleep apnea and placed on CPAP therapy.  He has not been consistent with his CPAP usage.  I reviewed the office note from 05/16/2020.  He was originally diagnosed with obstructive sleep apnea several years ago.  When he was still living in [place], he had radiofrequency ablation of the tongue which helped for about 4 to 8 months.  He had a home sleep test some 5 or 6 years ago in [place] and was diagnosed with moderate obstructive sleep apnea, I do not have those records available for review.  He was placed on either CPAP or AutoPap at the time and had difficulty tolerating the mask, most recently he tried a F 30i fullface mask.  Prior to that he tried nasal  pillows and several other masks.  The latest mask was the most tolerable.  He did have significant leak, owing in part to facial hair he admits.  He has struggled with his weight for years.  He had evaluation for bariatric surgery even at 1 point in [place] but did not pursue it at the time as it was not conducive to his work life and family life.  He has a family history of obesity.  He suspects that he has a family history of sleep apnea but no one has been formally diagnosed. His wife has reported to having apneic pauses and he believes that she is noticing worsening of his sleep apnea.  His weight has fluctuated a little bit.  He lives with his wife and 59-year-old daughter.  He works from home. Bedtime is generally between 930 and 10:30 PM and rise time around 6:30 AM.  He sees psychiatry for his depression and anxiety and recently had a change from sertraline to fluoxetine about a  month or 2 ago.  He has nocturia about once per average night and denies recurrent morning headaches.  He does not wake up rested.  He would like to explore other treatment options for sleep apnea. He reports that a friend of his who is a PA referred him to [facility] to an ENT.  I was able to review a phone conversation, patient talked to Dr. Hendricks Limes with [facility] regarding inspire.  He was advised that given his weight that he would not be a candidate currently for the hypoglossal nerve stimulator.  He was encouraged to work on [employer]. He is a non-smoker.  He drinks alcohol once every other day or so, he drinks coffee about 2 cups in the mornings as far as caffeine intake.     The patient's allergies, current medications, family history, past medical history, past social history, past surgical history and problem list were reviewed and updated as appropriate.    His Past Medical History Is Significant For: Past Medical History:  Diagnosis Date   Chronic sinus complaints    Drug-induced hypothyroidism  06/13/2014   GAD (generalized anxiety disorder) 12/19/2015   GERD (gastroesophageal reflux disease)    Graves disease    Hypertension    Hypotestosteronism 06/13/2014   Major depression 06/13/2014   Migraine variant    Morbid obesity ([place]) 06/13/2014   OSA (obstructive sleep apnea)    Thyrotoxic crisis    hx of    His Past Surgical History Is Significant For: Past Surgical History:  Procedure Laterality Date   ADENOIDECTOMY     ANTERIOR CERVICAL DECOMP/DISCECTOMY FUSION  10/06/2018   C6-C7   REMOVAL OF TISSUE BILATERAL EUSTACHIAN TUBE DILATION ; Surgeon: Ileene Rubens, MD; Location: [facility] OR; Service  08/30/2014   SEPTOPLASTY, BILATERAL ENDOSCOPIC MIDDLE MEATAL ANTROSTOMY  08/30/2014   TONGUE SURGERY      His Family History Is Significant For: Family History  Problem Relation Age of Onset   Schizophrenia Maternal Grandfather    Colon cancer Maternal Grandfather    Early death Maternal Grandfather    Diabetes Father    Colon cancer Paternal Uncle    Early death Paternal Uncle    Depression Mother    Stroke Paternal Grandmother     His Social History Is Significant For: Social History   Socioeconomic History   Marital status: Married    Spouse name: Bertin Inabinet   Number of children: 1   Years of education: BA   Highest education level: Not on file  Occupational History   Not on file  Tobacco Use   Smoking status: Never   Smokeless tobacco: Never  Substance and Sexual Activity   Alcohol use: Yes    Alcohol/week: 0.0 standard drinks    Comment: 1 drink every evening   Drug use: No   Sexual activity: Yes  Other Topics Concern   Not on file  Social History Narrative   Right handed   Caffeine use: 2 cup coffee per day, 1 soda per day   Social Determinants of Health   Financial Resource Strain: Not on file  Food Insecurity: Not on file  Transportation Needs: Not on file  Physical Activity: Not on file  Stress: Not on file  Social Connections: Not on file     His Allergies Are:  Allergies  Allergen Reactions   Nickel Other (See Comments)    Skin irritation  :   His Current Medications Are:  Outpatient Encounter Medications as of  02/18/2021  Medication Sig   AMBULATORY NON FORMULARY MEDICATION Change Continuous positive airway pressure (CPAP) machine setting to 11 cm of H2O pressure, with 15 min ramp. Send 30 day report after 30 days of use.   aspirin EC 81 MG tablet Take 81 mg by mouth daily. Swallow whole.   B-D 3CC LUER-LOK SYR 23GX1" 23G X 1" 3 ML MISC SMARTSIG:1 Syringe(s) IM Once a Week   buPROPion (WELLBUTRIN XL) 150 MG 24 hr tablet Take 3 tablets by mouth once daily   busPIRone (BUSPAR) 10 MG tablet TAKE 1 TABLET BY MOUTH ONCE DAILY AS NEEDED   DULoxetine (CYMBALTA) 20 MG capsule Take 1 capsule (20 mg total) by mouth daily.   levothyroxine (SYNTHROID) 112 MCG tablet Take 224 mcg by mouth every morning.   NEEDLE, DISP, 18 G (B-D HYPODERMIC NEEDLE 18GX1.5") 18G X 1-1/2" MISC USE AS DIRECTED to draw up testosterone   pantoprazole (PROTONIX) 40 MG tablet Take 40 mg by mouth daily.   SYRINGE DISPOSABLE 3CC 3 ML MISC Use 1 syringe per week with testosterone injection.   testosterone cypionate (DEPOTESTOSTERONE CYPIONATE) 200 MG/ML injection Inject 0.75 mLs (150 mg total) into the muscle once a week. (Patient taking differently: Inject 150 mg into the muscle once a week. 83m per week)   valsartan (DIOVAN) 320 MG tablet Take 1 tablet by mouth once daily   [DISCONTINUED] FLUoxetine (PROZAC) 10 MG capsule Take 1 capsule by mouth once daily   [DISCONTINUED] levothyroxine (SYNTHROID) 137 MCG tablet Take 274 mcg by mouth daily. (Patient not taking: Reported on 02/18/2021)   [DISCONTINUED] meloxicam (MOBIC) 15 MG tablet One tab PO qAM with a meal for 2 weeks, then daily prn pain.   [DISCONTINUED] omeprazole (PRILOSEC) 40 MG capsule Take 1 capsule (40 mg total) by mouth daily.   No facility-administered encounter medications on file as of 02/18/2021.   :  Review of Systems:  Out of a complete 14 point review of systems, all are reviewed and negative with the exception of these symptoms as listed below:  Review of Systems  Neurological:        Pt here on New Yrs Eve morning up to RR, (sitting) on toilet, Had 2 min R sided numbness, dizzy, lightheadedness, confusion.  Seen [facility] ED, work up done (cardiac enzymes elevated). Other work up negative. No other episodes since.  Using cpap (not regularly).    Objective:  Neurological Exam  Physical Exam Physical Examination:   Vitals:   02/18/21 1407  BP: (!) 144/92  Pulse: 80    General Examination: The patient is a very pleasant 41y.o. male in no acute distress. He appears well-developed and well-nourished and very well groomed.   HEENT: Normocephalic, atraumatic, pupils are equal, round and reactive to light, extraocular tracking is good without limitation to gaze excursion or nystagmus noted. Hearing is grossly intact. Face is symmetric with normal facial animation. Speech is clear with no dysarthria noted. There is no hypophonia. There is no lip, neck/head, jaw or voice tremor. Neck is supple with full range of passive and active motion. There are no carotid bruits on auscultation. Oropharynx exam reveals: mild mouth dryness, adequate dental hygiene and moderate airway crowding. Tongue protrudes centrally and palate elevates symmetrically. Full beard.   Chest: Clear to auscultation without wheezing, rhonchi or crackles noted.   Heart: S1+S2+0, regular and normal without murmurs, rubs or gallops noted.    Abdomen: Soft, non-tender and non-distended.   Extremities: There is no pitting edema in the  distal lower extremities bilaterally.    Skin: Warm and dry without trophic changes noted.    Musculoskeletal: exam reveals no obvious joint deformities, tenderness or joint swelling or erythema.    Neurologically:  Mental status: The patient is awake, alert and oriented in all 4  spheres. His immediate and remote memory, attention, language skills and fund of knowledge are appropriate. There is no evidence of aphasia, agnosia, apraxia or anomia. Speech is clear with normal prosody and enunciation. Thought process is linear. Mood is normal and affect is normal.  Cranial nerves II - XII are as described above under HEENT exam.  Motor exam: Normal bulk, strength and tone is noted. There is no tremor with UEs extended, no drift or rebound. Fine motor skills and coordination: grossly intact.  Reflexes are 1+, toes are downgoing bilaterally. Cerebellar testing: No dysmetria or intention tremor. There is no truncal or gait ataxia.  Finger-to-nose and heel-to-shin are unremarkable. Sensory exam: intact to light touch in the upper and lower extremities.  Gait, station and balance: He stands easily. No veering to one side is noted. No leaning to one side is noted. Posture is age-appropriate and stance is narrow based. Gait shows normal stride length and normal pace. No problems turning are noted.    Assessment and Plan:    In summary, Tyler Hancock is a very pleasant 41 year old male with an underlying medical history of Graves' disease, hypothyroidism, hypogonadism, chronic sinus issues, reflux disease, anxiety, depression, migraine headaches, and morbid obesity with a BMI of over 69, who presents for evaluation for possible TIA.  He had a very brief episode of right-sided weakness in late December.  He had similar work-up through [facility].  He feels at baseline.  He has tried to use his CPAP more consistently but is still not optimal with his CPAP usage.  We talked about TIA and work-up and secondary prevention at length today.  He is encouraged to continue to use his CPAP but be more consistent with it.  We talked about the importance of complete smoking cessation and good hydration with water.  He is advised to continue with baby aspirin.  While he did not have a bad cholesterol  panel, his LDL ideally is recommended to be less than 70.  He is advised to talk to his primary care or at the upcoming appointment with cardiology about cholesterol management with a statin for example.  We talked about the importance of weight management and regular exercise as well as optimal treatment of sleep apnea. From my end of things he has no restrictions for his exercise and weight loss endeavor.  He is advised to seek a mask fit appointment with his DME company to see if there is a different style of fullface mask that they can fit him with.  He uses a F 20 ResMed fullface mask currently.  He is advised about at least 4 other options that he could consider with the help of his DME representative.  I placed a request for a mask refit appointment.  He is advised that there is no additional test required from the neurologic standpoint, exam is nonfocal thankfully and he feels at baseline currently.  He is advised to follow-up routinely to see one of our nurse practitioners in 6 months, sooner if needed.  I answered all the questions today and the patient and his wife were in agreement. I spent 45 minutes in total face-to-face time and in reviewing records during pre-charting,  more than 50% of which was spent in counseling and coordination of care, reviewing test results, reviewing medications and treatment regimen and/or in discussing or reviewing the diagnosis of TIA, OSA, the prognosis and treatment options. Pertinent laboratory and imaging test results that were available during this visit with the patient were reviewed by me and considered in my medical decision making (see chart for details).

## 2021-02-20 DIAGNOSIS — R9431 Abnormal electrocardiogram [ECG] [EKG]: Secondary | ICD-10-CM | POA: Diagnosis not present

## 2021-02-20 DIAGNOSIS — R079 Chest pain, unspecified: Secondary | ICD-10-CM | POA: Diagnosis not present

## 2021-02-20 DIAGNOSIS — R42 Dizziness and giddiness: Secondary | ICD-10-CM | POA: Diagnosis not present

## 2021-02-20 DIAGNOSIS — I10 Essential (primary) hypertension: Secondary | ICD-10-CM | POA: Diagnosis not present

## 2021-02-21 NOTE — Progress Notes (Signed)
Denyse Amass, RN; Vanessa Ralphs got it      Previous Messages   ----- Message -----  From: Brandon Melnick, RN  Sent: 02/20/2021   2:38 PM EST  To: Ocie Bob, *  Subject: mask fit appt needed                           Order in Epic for  Tyler Hancock. Tyler Hancock  Male, 41 y.o., [birth year]  MRN:  [id number]   Thanks Lovey Newcomer RN

## 2021-02-22 DIAGNOSIS — E039 Hypothyroidism, unspecified: Secondary | ICD-10-CM | POA: Diagnosis not present

## 2021-02-22 DIAGNOSIS — F418 Other specified anxiety disorders: Secondary | ICD-10-CM | POA: Diagnosis not present

## 2021-02-22 DIAGNOSIS — G459 Transient cerebral ischemic attack, unspecified: Secondary | ICD-10-CM | POA: Diagnosis not present

## 2021-02-26 ENCOUNTER — Telehealth: Payer: BC Managed Care – PPO | Admitting: Adult Health

## 2021-02-26 DIAGNOSIS — G4733 Obstructive sleep apnea (adult) (pediatric): Secondary | ICD-10-CM | POA: Diagnosis not present

## 2021-02-26 NOTE — Progress Notes (Deleted)
Referring-Tyler Zigmund Daniel, DO Reason for referral-chest pain  HPI: 41 year old male for evaluation of chest pain at request of Weston Settle, DO.  Patient seen recently with TIA type symptoms (transient confusion and right-sided weakness that lasted less than 2 minutes while having a bowel movement) and chest pain.  Laboratories minimally elevated troponin at 35, hemoglobin 16.3, normal renal function and liver functions.  Total cholesterol 138 with an LDL 82 and HDL 37.  Echocardiogram by report showed normal LV function, mild left ventricular hypertrophy, mild left atrial enlargement.  CTA showed no large vessel arterial occlusion or significant stenosis.  MRI of the brain showed no acute abnormality.  Patient was seen by Cherre Robins MD with [facility] cardiology.  Recommendation was for stress echocardiogram and Holter monitor.  Episode felt possibly secondary to a vagal reaction.  Patient requested a second opinion and we are now asked to evaluate.  Current Outpatient Medications  Medication Sig Dispense Refill   AMBULATORY NON FORMULARY MEDICATION Change Continuous positive airway pressure (CPAP) machine setting to 11 cm of H2O pressure, with 15 min ramp. Send 30 day report after 30 days of use. 1 each 0   aspirin EC 81 MG tablet Take 81 mg by mouth daily. Swallow whole.     B-D 3CC LUER-LOK SYR 23GX1" 23G X 1" 3 ML MISC SMARTSIG:1 Syringe(s) IM Once a Week     buPROPion (WELLBUTRIN XL) 150 MG 24 hr tablet Take 3 tablets by mouth once daily 270 tablet 3   busPIRone (BUSPAR) 10 MG tablet TAKE 1 TABLET BY MOUTH ONCE DAILY AS NEEDED 90 tablet 0   DULoxetine (CYMBALTA) 20 MG capsule Take 1 capsule (20 mg total) by mouth daily. 30 capsule O   levothyroxine (SYNTHROID) 112 MCG tablet Take 224 mcg by mouth every morning.     NEEDLE, DISP, 18 G (B-D HYPODERMIC NEEDLE 18GX1.5") 18G X 1-1/2" MISC USE AS DIRECTED to draw up testosterone 100 each 1   pantoprazole (PROTONIX) 40 MG tablet Take 40 mg by  mouth daily.     SYRINGE DISPOSABLE 3CC 3 ML MISC Use 1 syringe per week with testosterone injection. 25 each prn   testosterone cypionate (DEPOTESTOSTERONE CYPIONATE) 200 MG/ML injection Inject 0.75 mLs (150 mg total) into the muscle once a week. (Patient taking differently: Inject 150 mg into the muscle once a week. 58ml per week) 18 mL 1   valsartan (DIOVAN) 320 MG tablet Take 1 tablet by mouth once daily 90 tablet 0   No current facility-administered medications for this visit.    Allergies  Allergen Reactions   Nickel Other (See Comments)    Skin irritation     Past Medical History:  Diagnosis Date   Chronic sinus complaints    Drug-induced hypothyroidism 06/13/2014   GAD (generalized anxiety disorder) 12/19/2015   GERD (gastroesophageal reflux disease)    Graves disease    Hypertension    Hypotestosteronism 06/13/2014   Major depression 06/13/2014   Migraine variant    Morbid obesity ([place]) 06/13/2014   OSA (obstructive sleep apnea)    Thyrotoxic crisis    hx of    Past Surgical History:  Procedure Laterality Date   ADENOIDECTOMY     ANTERIOR CERVICAL DECOMP/DISCECTOMY FUSION  10/06/2018   C6-C7   REMOVAL OF TISSUE BILATERAL EUSTACHIAN TUBE DILATION ; Surgeon: Ileene Rubens, MD; Location: [facility] OR; Service  08/30/2014   SEPTOPLASTY, BILATERAL ENDOSCOPIC MIDDLE MEATAL ANTROSTOMY  08/30/2014   TONGUE SURGERY  Social History   Socioeconomic History   Marital status: Married    Spouse name: Enzo Treu   Number of children: 1   Years of education: BA   Highest education level: Not on file  Occupational History   Not on file  Tobacco Use   Smoking status: Never   Smokeless tobacco: Never  Substance and Sexual Activity   Alcohol use: Yes    Alcohol/week: 0.0 standard drinks    Comment: 1 drink every evening   Drug use: No   Sexual activity: Yes  Other Topics Concern   Not on file  Social History Narrative   Right handed   Caffeine use: 2 cup coffee per day,  1 soda per day   Social Determinants of Health   Financial Resource Strain: Not on file  Food Insecurity: Not on file  Transportation Needs: Not on file  Physical Activity: Not on file  Stress: Not on file  Social Connections: Not on file  Intimate Partner Violence: Not on file    Family History  Problem Relation Age of Onset   Schizophrenia Maternal Grandfather    Colon cancer Maternal Grandfather    Early death Maternal Grandfather    Diabetes Father    Colon cancer Paternal Uncle    Early death Paternal Uncle    Depression Mother    Stroke Paternal Grandmother     ROS: no fevers or chills, productive cough, hemoptysis, dysphasia, odynophagia, melena, hematochezia, dysuria, hematuria, rash, seizure activity, orthopnea, PND, pedal edema, claudication. Remaining systems are negative.  Physical Exam:   There were no vitals taken for this visit.  General:  Well developed/well nourished in NAD Skin warm/dry Patient not depressed No peripheral clubbing Back-normal HEENT-normal/normal eyelids Neck supple/normal carotid upstroke bilaterally; no bruits; no JVD; no thyromegaly chest - CTA/ normal expansion CV - RRR/normal S1 and S2; no murmurs, rubs or gallops;  PMI nondisplaced Abdomen -NT/ND, no HSM, no mass, + bowel sounds, no bruit 2+ femoral pulses, no bruits Ext-no edema, chords, 2+ DP Neuro-grossly nonfocal  ECG - personally reviewed  A/P  1 chest pain-  2 hypertension-blood pressure controlled.  Continue present medications.  3 morbid obesity-we discussed importance of diet, exercise and weight loss.  4 obstructive sleep apnea-continue CPAP.  5 question recent TIA-follow-up neurology.  Kirk Ruths, MD

## 2021-03-04 DIAGNOSIS — R350 Frequency of micturition: Secondary | ICD-10-CM | POA: Diagnosis not present

## 2021-03-04 DIAGNOSIS — J321 Chronic frontal sinusitis: Secondary | ICD-10-CM | POA: Diagnosis not present

## 2021-03-06 ENCOUNTER — Encounter (HOSPITAL_COMMUNITY): Payer: Self-pay | Admitting: Psychiatry

## 2021-03-06 ENCOUNTER — Telehealth (INDEPENDENT_AMBULATORY_CARE_PROVIDER_SITE_OTHER): Payer: BC Managed Care – PPO | Admitting: Psychiatry

## 2021-03-06 ENCOUNTER — Ambulatory Visit: Payer: BC Managed Care – PPO | Admitting: Cardiology

## 2021-03-06 DIAGNOSIS — F411 Generalized anxiety disorder: Secondary | ICD-10-CM

## 2021-03-06 DIAGNOSIS — F331 Major depressive disorder, recurrent, moderate: Secondary | ICD-10-CM | POA: Diagnosis not present

## 2021-03-06 MED ORDER — BUSPIRONE HCL 10 MG PO TABS: 10.0000 mg | ORAL_TABLET | Freq: Every day | ORAL | 0 refills | Status: AC | PRN

## 2021-03-06 MED ORDER — DULOXETINE HCL 20 MG PO CPEP: 20.0000 mg | ORAL_CAPSULE | Freq: Every day | ORAL | 2 refills | Status: DC

## 2021-03-06 NOTE — Progress Notes (Signed)
[facility] Follow up visit  Patient Identification: Tyler Hancock MRN:  [id number] Date of Evaluation:  03/06/2021 Referral Source: primary care Chief Complaint: follow up  depression Visit Diagnosis:    ICD-10-CM   1. Major depressive disorder, recurrent episode, moderate with anxious distress (HCC)  F33.1 DULoxetine (CYMBALTA) 20 MG capsule    2. GAD (generalized anxiety disorder)  F41.1 busPIRone (BUSPAR) 10 MG tablet    Virtual Visit via Video Note  I connected with Tyler Hancock on 03/06/21 at 11:00 AM EST by a video enabled telemedicine application and verified that I am speaking with the correct person using two identifiers.  Location: Patient: home Provider: home office   I discussed the limitations of evaluation and management by telemedicine and the availability of in person appointments. The patient expressed understanding and agreed to proceed.    I discussed the assessment and treatment plan with the patient. The patient was provided an opportunity to ask questions and all were answered. The patient agreed with the plan and demonstrated an understanding of the instructions.   The patient was advised to call back or seek an in-person evaluation if the symptoms worsen or if the condition fails to improve as anticipated.  I provided 15 minutes of non-face-to-face time during this encounter.         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   Has had a TIA in December and following with cardiology, Pcp, ruling out any cause Overall doing fair , has lowered cymbalta 20mg  and dont think was it related to cymbalta Wife doing better and back to work Cpap helping sleep and fatigue       Aggravating factors; finances, recent stroke  or tia being evaluated Modifying factors for daughters, wife,daughter Duration for 39 years or more Understand to avoid alcohol have discused before for cutting down      Past Psychiatric History: depression, anxiety  Previous Psychotropic Medications: Yes   Substance Abuse History in the last 12 months:  Yes.    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   GAD (generalized anxiety disorder) 12/19/2015   GERD (gastroesophageal reflux disease)    Graves disease    Hypertension    Hypotestosteronism 06/13/2014   Major depression 06/13/2014   Migraine variant    Morbid obesity ([place]) 06/13/2014   OSA (obstructive sleep apnea)    Thyrotoxic crisis    hx of    Past Surgical History:  Procedure Laterality Date   ADENOIDECTOMY     ANTERIOR CERVICAL DECOMP/DISCECTOMY FUSION  10/06/2018   C6-C7   REMOVAL OF TISSUE BILATERAL EUSTACHIAN TUBE DILATION ; Surgeon: Ileene Rubens, MD; Location: [facility] OR; Service  08/30/2014   SEPTOPLASTY, BILATERAL ENDOSCOPIC MIDDLE MEATAL ANTROSTOMY  08/30/2014   TONGUE SURGERY      Family Psychiatric History: mom depression Mom side depression Uncle schizophrenia  Family History:  Family History  Problem Relation Age of Onset   Schizophrenia Maternal Grandfather    Colon cancer Maternal Grandfather    Early death Maternal Grandfather    Diabetes Father    Colon cancer Paternal Uncle  Early death Paternal Uncle    Depression Mother    Stroke Paternal Grandmother     Social History:   Social History   Socioeconomic History   Marital status: Married    Spouse name: Andre Swander   Number of children: 1   Years of education: BA   Highest education level: Not on file  Occupational History   Not on file  Tobacco Use   Smoking status: Never   Smokeless tobacco: Never   Substance and Sexual Activity   Alcohol use: Yes    Alcohol/week: 0.0 standard drinks    Comment: 1 drink every evening   Drug use: No   Sexual activity: Yes  Other Topics Concern   Not on file  Social History Narrative   Right handed   Caffeine use: 2 cup coffee per day, 1 soda per day   Social Determinants of Health   Financial Resource Strain: Not on file  Food Insecurity: Not on file  Transportation Needs: Not on file  Physical Activity: Not on file  Stress: Not on file  Social Connections: Not on file      Allergies:   Allergies  Allergen Reactions   Nickel Other (See Comments)    Skin irritation    Metabolic Disorder Labs: Lab Results  Component Value Date   HGBA1C 5.0 02/11/2018   MPG 97 02/11/2018   MPG 94 04/15/2017   No results found for: PROLACTIN Lab Results  Component Value Date   CHOL 155 05/16/2020   TRIG 66 05/16/2020   HDL 43 05/16/2020   CHOLHDL 3.6 05/16/2020   VLDL 8 02/26/2015   LDLCALC 97 05/16/2020   LDLCALC 102 (H) 04/15/2017   Lab Results  Component Value Date   TSH 24.98 (H) 06/24/2019    Therapeutic Level Labs: No results found for: LITHIUM No results found for: CBMZ No results found for: VALPROATE  Current Medications: Current Outpatient Medications  Medication Sig Dispense Refill   AMBULATORY NON FORMULARY MEDICATION Change Continuous positive airway pressure (CPAP) machine setting to 11 cm of H2O pressure, with 15 min ramp. Send 30 day report after 30 days of use. 1 each 0   aspirin EC 81 MG tablet Take 81 mg by mouth daily. Swallow whole.     B-D 3CC LUER-LOK SYR 23GX1" 23G X 1" 3 ML MISC SMARTSIG:1 Syringe(s) IM Once a Week     buPROPion (WELLBUTRIN XL) 150 MG 24 hr tablet Take 3 tablets by mouth once daily 270 tablet 3   busPIRone (BUSPAR) 10 MG tablet Take 1 tablet (10 mg total) by mouth daily as needed. 90 tablet 0   DULoxetine (CYMBALTA) 20 MG capsule Take 1 capsule (20 mg total) by mouth daily. 30 capsule 2    levothyroxine (SYNTHROID) 112 MCG tablet Take 224 mcg by mouth every morning.     NEEDLE, DISP, 18 G (B-D HYPODERMIC NEEDLE 18GX1.5") 18G X 1-1/2" MISC USE AS DIRECTED to draw up testosterone 100 each 1   pantoprazole (PROTONIX) 40 MG tablet Take 40 mg by mouth daily.     SYRINGE DISPOSABLE 3CC 3 ML MISC Use 1 syringe per week with testosterone injection. 25 each prn   testosterone cypionate (DEPOTESTOSTERONE CYPIONATE) 200 MG/ML injection Inject 0.75 mLs (150 mg total) into the muscle once a week. (Patient taking differently: Inject 150 mg into the muscle once a week. 35ml per week) 18 mL 1   valsartan (DIOVAN) 320 MG tablet Take 1 tablet by mouth once daily 90 tablet  0   No current facility-administered medications for this visit.     Psychiatric Specialty Exam: Review of Systems  Cardiovascular:  Negative for chest pain.  Neurological:  Negative for tremors.  Psychiatric/Behavioral:  Negative for agitation and self-injury.    There were no vitals taken for this visit.There is no height or weight on file to calculate BMI.  General Appearance: Casual  Eye Contact:  Fair  Speech:  Normal Rate  Volume:  Decreased  Mood:  fair  Affect:  Congruent  Thought Process:  Goal Directed  Orientation:  Full (Time, Place, and Person)  Thought Content:  Rumination  Suicidal Thoughts:  No  Homicidal Thoughts:  No  Memory:  Immediate;   Fair Recent;   Fair  Judgement:  Fair  Insight:  Shallow  Psychomotor Activity:  Decreased  Concentration:  Concentration: Fair and Attention Span: Fair  Recall:  [employer] of Knowledge:Good  Language: Good  Akathisia:  No  Handed:   AIMS (if indicated):  not done  Assets:  Desire for Improvement Housing Social Support  ADL's:  Intact  Cognition: WNL  Sleep:  Fair   Screenings: GAD-7    [place] Office Visit from 05/07/2020 in [facility] Office Visit from 12/26/2019 in [place] Visit from 04/17/2017 in [place] Visit from 01/17/2016 in [facility] Office Visit from 01/15/2016 in [place]  Total GAD-7 Score 13 5 6 10 11       PHQ2-9    [place] Office Visit from 02/12/2021 in [facility] Visit from 05/07/2020 in [place] Video Visit from 04/12/2020 in [facility] Counselor from 04/02/2020 in [place] Office Visit from 12/26/2019 in [facility]  PHQ-2 Total Score 3 4 2 1 3   PHQ-9 Total Score -- 14 14 -- 8      Flowsheet Row Video Visit from 03/06/2021 in [place] Video Visit from 01/24/2021 in [place] Video Visit from 08/30/2020 in [place] No Risk No Risk No Risk       Assessment and Plan: as follows  Prior documentation reviewed   Major depressive disorder recurrent moderate; fair, continue cymbalta 20mg  Also on wellbutrin Continue cpap  Generalized anxiety disorder; managing it, worries related to health and following with cardiology to rule out any cause of TIA continue cymbalta and buspar  Tiredness: fair, on cpap  Abstain from alcohol to avoid tiredness   Fu 53m or earlier if needed  Merian Capron, MD 1/25/202311:10 AM

## 2021-03-07 DIAGNOSIS — F419 Anxiety disorder, unspecified: Secondary | ICD-10-CM | POA: Diagnosis not present

## 2021-03-08 ENCOUNTER — Other Ambulatory Visit (HOSPITAL_COMMUNITY): Payer: Self-pay | Admitting: Psychiatry

## 2021-03-08 DIAGNOSIS — F411 Generalized anxiety disorder: Secondary | ICD-10-CM

## 2021-03-10 ENCOUNTER — Other Ambulatory Visit: Payer: Self-pay | Admitting: Family Medicine

## 2021-03-26 DIAGNOSIS — G4733 Obstructive sleep apnea (adult) (pediatric): Secondary | ICD-10-CM | POA: Diagnosis not present

## 2021-03-30 DIAGNOSIS — Z630 Problems in relationship with spouse or partner: Secondary | ICD-10-CM | POA: Diagnosis not present

## 2021-03-30 DIAGNOSIS — F4321 Adjustment disorder with depressed mood: Secondary | ICD-10-CM | POA: Diagnosis not present

## 2021-04-01 DIAGNOSIS — F419 Anxiety disorder, unspecified: Secondary | ICD-10-CM | POA: Diagnosis not present

## 2021-04-05 DIAGNOSIS — F4321 Adjustment disorder with depressed mood: Secondary | ICD-10-CM | POA: Diagnosis not present

## 2021-04-05 DIAGNOSIS — R55 Syncope and collapse: Secondary | ICD-10-CM | POA: Diagnosis not present

## 2021-04-05 DIAGNOSIS — Z630 Problems in relationship with spouse or partner: Secondary | ICD-10-CM | POA: Diagnosis not present

## 2021-04-05 DIAGNOSIS — R2990 Unspecified symptoms and signs involving the nervous system: Secondary | ICD-10-CM | POA: Diagnosis not present

## 2021-04-05 DIAGNOSIS — G4733 Obstructive sleep apnea (adult) (pediatric): Secondary | ICD-10-CM | POA: Diagnosis not present

## 2021-04-05 DIAGNOSIS — I10 Essential (primary) hypertension: Secondary | ICD-10-CM | POA: Diagnosis not present

## 2021-04-10 DIAGNOSIS — F4321 Adjustment disorder with depressed mood: Secondary | ICD-10-CM | POA: Diagnosis not present

## 2021-04-10 DIAGNOSIS — Z630 Problems in relationship with spouse or partner: Secondary | ICD-10-CM | POA: Diagnosis not present

## 2021-04-11 DIAGNOSIS — R42 Dizziness and giddiness: Secondary | ICD-10-CM | POA: Diagnosis not present

## 2021-04-11 DIAGNOSIS — R079 Chest pain, unspecified: Secondary | ICD-10-CM | POA: Diagnosis not present

## 2021-04-15 DIAGNOSIS — Z3009 Encounter for other general counseling and advice on contraception: Secondary | ICD-10-CM | POA: Diagnosis not present

## 2021-04-16 DIAGNOSIS — F419 Anxiety disorder, unspecified: Secondary | ICD-10-CM | POA: Diagnosis not present

## 2021-04-23 ENCOUNTER — Telehealth (HOSPITAL_COMMUNITY): Payer: Self-pay | Admitting: Psychiatry

## 2021-04-23 NOTE — Telephone Encounter (Signed)
8:38am - called pt back and left vm  ?Phone:  [phone] ? ?Pt left a vm last night asking for someone to call him in regards to his appt. with Dr. De Nurse  ?

## 2021-04-23 NOTE — Telephone Encounter (Signed)
Pt left vm

## 2021-04-24 ENCOUNTER — Other Ambulatory Visit (HOSPITAL_COMMUNITY): Payer: Self-pay | Admitting: Psychiatry

## 2021-04-24 DIAGNOSIS — F4323 Adjustment disorder with mixed anxiety and depressed mood: Secondary | ICD-10-CM | POA: Diagnosis not present

## 2021-04-24 DIAGNOSIS — F411 Generalized anxiety disorder: Secondary | ICD-10-CM

## 2021-04-29 ENCOUNTER — Encounter (HOSPITAL_COMMUNITY): Payer: Self-pay | Admitting: Psychiatry

## 2021-04-29 ENCOUNTER — Telehealth (INDEPENDENT_AMBULATORY_CARE_PROVIDER_SITE_OTHER): Payer: BC Managed Care – PPO | Admitting: Psychiatry

## 2021-04-29 DIAGNOSIS — F419 Anxiety disorder, unspecified: Secondary | ICD-10-CM | POA: Diagnosis not present

## 2021-04-29 DIAGNOSIS — R5383 Other fatigue: Secondary | ICD-10-CM

## 2021-04-29 DIAGNOSIS — F32A Depression, unspecified: Secondary | ICD-10-CM | POA: Diagnosis not present

## 2021-04-29 DIAGNOSIS — F331 Major depressive disorder, recurrent, moderate: Secondary | ICD-10-CM | POA: Diagnosis not present

## 2021-04-29 DIAGNOSIS — R42 Dizziness and giddiness: Secondary | ICD-10-CM | POA: Diagnosis not present

## 2021-04-29 DIAGNOSIS — F411 Generalized anxiety disorder: Secondary | ICD-10-CM

## 2021-04-29 MED ORDER — DULOXETINE HCL 30 MG PO CPEP: 30.0000 mg | ORAL_CAPSULE | Freq: Every day | ORAL | 1 refills | Status: AC

## 2021-04-29 NOTE — Progress Notes (Signed)
[facility] Follow up visit ? ?Patient Identification: Tyler Hancock ?MRN:  [id number] ?Date of Evaluation:  04/29/2021 ?Referral Source: primary care ?Chief Complaint: follow up  depression ?Visit Diagnosis:  ?  ICD-10-CM   ?1. GAD (generalized anxiety disorder)  F41.1   ?  ?2. Major depressive disorder, recurrent episode, moderate with anxious distress (HCC)  F33.1 DULoxetine (CYMBALTA) 30 MG capsule  ?  ?3. Tiredness  R53.83   ?  ?Virtual Visit via Video Note ? ?I connected with Tyler Hancock on 04/29/21 at  3:30 PM EDT by a video enabled telemedicine application and verified that I am speaking with the correct person using two identifiers. ? ?Location: ?Patient: home ?Provider: home office ?  ?I discussed the limitations of evaluation and management by telemedicine and the availability of in person appointments. The patient expressed understanding and agreed to proceed. ? ? ? ?  ?I discussed the assessment and treatment plan with the patient. The patient was provided an opportunity to ask questions and all were answered. The patient agreed with the plan and demonstrated an understanding of the instructions. ?  ?The patient was advised to call back or seek an in-person evaluation if the symptoms worsen or if the condition fails to improve as anticipated. ? ?I provided 20 minutes of non-face-to-face time during this encounter. ? ? ? ? ? ?  ? ? ?History of Present Illness: Patient is a 41 years old currently married Caucasian male initially ? 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 ? ? ?Has had a TIA in December and following with cardiology, Pcp, ruling out any cause ?Cpap helping sleep and fatigue ?Have kept cymbalta dose low since TIA, did not find any link to its cause but probably BP ?Feels amotivated, going thru marital counselling says decrease energy and subdued at  times ?Has tried therapist before but didn't felt it worked or not connected ?For individual ? ?Aggravating factors; finances, recent stroke or tia being evaluated ?Modifying factors for daughters,wife, daughter ?Duration for 5 years or more ?Understand to avoid alcohol have discused before for cutting down ? ?Depression remains subdued ? ? ? ?Past Psychiatric History: depression, anxiety ? ?Previous Psychotropic Medications: Yes  ? ?Substance Abuse History in the last 12 months:  Yes.   ? ?Consequences of Substance Abuse: ?discussed alcohol effect on depression, amotivation ? ?Past Medical History:  ?Past Medical History:  ?Diagnosis Date  ? Chronic sinus complaints   ? 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 GAD (generalized anxiety disorder) 12/19/2015  ? GERD (gastroesophageal reflux disease)   ? Graves disease   ? Hypertension   ? Hypotestosteronism 06/13/2014  ? Major depression 06/13/2014  ? Migraine variant   ? Morbid obesity ([place]) 06/13/2014  ? OSA (obstructive sleep apnea)   ? Thyrotoxic crisis   ? hx of  ?  ?Past Surgical History:  ?Procedure Laterality Date  ? ADENOIDECTOMY    ? ANTERIOR CERVICAL DECOMP/DISCECTOMY FUSION  10/06/2018  ? C6-C7  ? REMOVAL OF TISSUE BILATERAL EUSTACHIAN TUBE DILATION ; Surgeon: Ileene Rubens, MD; Location: [facility] OR; Service  08/30/2014  ? SEPTOPLASTY, BILATERAL ENDOSCOPIC MIDDLE MEATAL ANTROSTOMY  08/30/2014  ? TONGUE SURGERY    ? ? ?Family Psychiatric History: mom depression ?Mom side depression ?Uncle schizophrenia ? ?Family History:  ?Family History  ?Problem Relation Age of Onset  ? Schizophrenia Maternal Grandfather   ?  Colon cancer Maternal Grandfather   ? Early death Maternal Grandfather   ? Diabetes Father   ? Colon cancer Paternal Uncle   ? Early death Paternal Uncle   ? Depression Mother   ? Stroke Paternal Grandmother   ? ? ?Social History:   ?Social History  ? ?Socioeconomic History  ? Marital status: Married  ?  Spouse name: Tyler Hancock  ? Number of children: 1  ?  Years of education: BA  ? Highest education level: Not on file  ?Occupational History  ? Not on file  ?Tobacco Use  ? Smoking status: Never  ? Smokeless tobacco: Never  ?Substance and Sexual Activity  ? Alcohol use: Yes  ?  Alcohol/week: 0.0 standard drinks  ?  Comment: 1 drink every evening  ? Drug use: No  ? Sexual activity: Yes  ?Other Topics Concern  ? Not on file  ?Social History Narrative  ? Right handed  ? Caffeine use: 2 cup coffee per day, 1 soda per day  ? ?Social Determinants of Health  ? ?Financial Resource Strain: Not on file  ?Food Insecurity: Not on file  ?Transportation Needs: Not on file  ?Physical Activity: Not on file  ?Stress: Not on file  ?Social Connections: Not on file  ? ? ? ? ?Allergies:   ?Allergies  ?Allergen Reactions  ? Nickel Other (See Comments)  ?  Skin irritation  ? ? ?Metabolic Disorder Labs: ?Lab Results  ?Component Value Date  ? HGBA1C 5.0 02/11/2018  ? MPG 97 02/11/2018  ? MPG 94 04/15/2017  ? ?No results found for: PROLACTIN ?Lab Results  ?Component Value Date  ? CHOL 155 05/16/2020  ? TRIG 66 05/16/2020  ? HDL 43 05/16/2020  ? CHOLHDL 3.6 05/16/2020  ? VLDL 8 02/26/2015  ? [place] 97 05/16/2020  ? LDLCALC 102 (H) 04/15/2017  ? ?Lab Results  ?Component Value Date  ? TSH 24.98 (H) 06/24/2019  ? ? ?Therapeutic Level Labs: ?No results found for: LITHIUM ?No results found for: CBMZ ?No results found for: VALPROATE ? ?Current Medications: ?Current Outpatient Medications  ?Medication Sig Dispense Refill  ? AMBULATORY NON FORMULARY MEDICATION Change Continuous positive airway pressure (CPAP) machine setting to 11 cm of H2O pressure, with 15 min ramp. Send 30 day report after 30 days of use. 1 each 0  ? aspirin EC 81 MG tablet Take 81 mg by mouth daily. Swallow whole.    ? B-D 3CC LUER-LOK SYR 23GX1" 23G X 1" 3 ML MISC SMARTSIG:1 Syringe(s) IM Once a Week    ? buPROPion (WELLBUTRIN XL) 150 MG 24 hr tablet Take 3 tablets by mouth once daily 270 tablet 3  ? busPIRone (BUSPAR) 10 MG tablet  Take 1 tablet (10 mg total) by mouth daily as needed. 90 tablet 0  ? DULoxetine (CYMBALTA) 30 MG capsule Take 1 capsule (30 mg total) by mouth daily. Delete prior refills. Dose increased 30 capsule 1  ? levothyroxine (SYNTHROID) 112 MCG tablet Take 224 mcg by mouth every morning.    ? NEEDLE, DISP, 18 G (B-D HYPODERMIC NEEDLE 18GX1.5") 18G X 1-1/2" MISC USE AS DIRECTED to draw up testosterone 100 each 1  ? pantoprazole (PROTONIX) 40 MG tablet Take 40 mg by mouth daily.    ? SYRINGE DISPOSABLE 3CC 3 ML MISC Use 1 syringe per week with testosterone injection. 25 each prn  ? testosterone cypionate (DEPOTESTOSTERONE CYPIONATE) 200 MG/ML injection Inject 0.75 mLs (150 mg total) into the muscle once a week. (Patient taking differently:  Inject 150 mg into the muscle once a week. 68m per week) 18 mL 1  ? valsartan (DIOVAN) 320 MG tablet Take 1 tablet by mouth once daily 90 tablet 0  ? ?No current facility-administered medications for this visit.  ? ? ? ?Psychiatric Specialty Exam: ?Review of Systems  ?Cardiovascular:  Negative for chest pain.  ?Neurological:  Negative for tremors.  ?Psychiatric/Behavioral:  Positive for dysphoric mood. Negative for agitation and self-injury.    ?There were no vitals taken for this visit.There is no height or weight on file to calculate BMI.  ?General Appearance: Casual  ?Eye Contact:  Fair  ?Speech:  Normal Rate  ?Volume:  Decreased  ?Mood:  subdued  ?Affect:  Congruent  ?Thought Process:  Goal Directed  ?Orientation:  Full (Time, Place, and Person)  ?Thought Content:  Rumination  ?Suicidal Thoughts:  No  ?Homicidal Thoughts:  No  ?Memory:  Immediate;   Fair ?Recent;   Fair  ?Judgement:  Fair  ?Insight:  Shallow  ?Psychomotor Activity:  Decreased  ?Concentration:  Concentration: Fair and Attention Span: Fair  ?Recall:  Fair  ?[place] ?Language: Good  ?Akathisia:  No  ?Handed:   ?AIMS (if indicated):  not done  ?Assets:  Desire for Improvement ?Housing ?Social Support   ?ADL's:  Intact  ?Cognition: WNL  ?Sleep:  Fair  ? ?Screenings: ?GAD-7   ? ?[place]

## 2021-05-01 DIAGNOSIS — F4323 Adjustment disorder with mixed anxiety and depressed mood: Secondary | ICD-10-CM | POA: Diagnosis not present

## 2021-05-08 DIAGNOSIS — F4323 Adjustment disorder with mixed anxiety and depressed mood: Secondary | ICD-10-CM | POA: Diagnosis not present

## 2021-05-08 DIAGNOSIS — I10 Essential (primary) hypertension: Secondary | ICD-10-CM | POA: Diagnosis not present

## 2021-05-08 DIAGNOSIS — E039 Hypothyroidism, unspecified: Secondary | ICD-10-CM | POA: Diagnosis not present

## 2021-05-08 DIAGNOSIS — R5383 Other fatigue: Secondary | ICD-10-CM | POA: Diagnosis not present

## 2021-05-10 ENCOUNTER — Telehealth (HOSPITAL_COMMUNITY): Payer: BC Managed Care – PPO | Admitting: Psychiatry

## 2021-05-21 DIAGNOSIS — Z6841 Body Mass Index (BMI) 40.0 and over, adult: Secondary | ICD-10-CM | POA: Diagnosis not present

## 2021-05-21 DIAGNOSIS — E890 Postprocedural hypothyroidism: Secondary | ICD-10-CM | POA: Diagnosis not present

## 2021-05-21 DIAGNOSIS — E291 Testicular hypofunction: Secondary | ICD-10-CM | POA: Diagnosis not present

## 2021-05-22 DIAGNOSIS — F4323 Adjustment disorder with mixed anxiety and depressed mood: Secondary | ICD-10-CM | POA: Diagnosis not present

## 2021-05-30 DIAGNOSIS — F411 Generalized anxiety disorder: Secondary | ICD-10-CM | POA: Diagnosis not present

## 2021-05-30 DIAGNOSIS — G4700 Insomnia, unspecified: Secondary | ICD-10-CM | POA: Diagnosis not present

## 2021-05-30 DIAGNOSIS — F339 Major depressive disorder, recurrent, unspecified: Secondary | ICD-10-CM | POA: Diagnosis not present

## 2021-06-04 DIAGNOSIS — G4733 Obstructive sleep apnea (adult) (pediatric): Secondary | ICD-10-CM | POA: Diagnosis not present

## 2021-06-04 DIAGNOSIS — Z9989 Dependence on other enabling machines and devices: Secondary | ICD-10-CM | POA: Diagnosis not present

## 2021-06-05 ENCOUNTER — Telehealth (HOSPITAL_COMMUNITY): Payer: BC Managed Care – PPO | Admitting: Psychiatry

## 2021-06-05 DIAGNOSIS — F4323 Adjustment disorder with mixed anxiety and depressed mood: Secondary | ICD-10-CM | POA: Diagnosis not present

## 2021-06-19 DIAGNOSIS — F4323 Adjustment disorder with mixed anxiety and depressed mood: Secondary | ICD-10-CM | POA: Diagnosis not present

## 2021-06-20 DIAGNOSIS — Z302 Encounter for sterilization: Secondary | ICD-10-CM | POA: Diagnosis not present

## 2021-06-26 DIAGNOSIS — F4323 Adjustment disorder with mixed anxiety and depressed mood: Secondary | ICD-10-CM | POA: Diagnosis not present

## 2021-07-05 DIAGNOSIS — F32A Depression, unspecified: Secondary | ICD-10-CM | POA: Diagnosis not present

## 2021-07-05 DIAGNOSIS — F419 Anxiety disorder, unspecified: Secondary | ICD-10-CM | POA: Diagnosis not present

## 2021-07-05 DIAGNOSIS — I10 Essential (primary) hypertension: Secondary | ICD-10-CM | POA: Diagnosis not present

## 2021-07-10 DIAGNOSIS — F339 Major depressive disorder, recurrent, unspecified: Secondary | ICD-10-CM | POA: Diagnosis not present

## 2021-07-10 DIAGNOSIS — F411 Generalized anxiety disorder: Secondary | ICD-10-CM | POA: Diagnosis not present

## 2021-07-10 DIAGNOSIS — G4700 Insomnia, unspecified: Secondary | ICD-10-CM | POA: Diagnosis not present

## 2021-07-11 DIAGNOSIS — F4323 Adjustment disorder with mixed anxiety and depressed mood: Secondary | ICD-10-CM | POA: Diagnosis not present

## 2021-07-17 DIAGNOSIS — G4733 Obstructive sleep apnea (adult) (pediatric): Secondary | ICD-10-CM | POA: Diagnosis not present

## 2021-07-24 DIAGNOSIS — F411 Generalized anxiety disorder: Secondary | ICD-10-CM | POA: Diagnosis not present

## 2021-07-24 DIAGNOSIS — G4700 Insomnia, unspecified: Secondary | ICD-10-CM | POA: Diagnosis not present

## 2021-07-24 DIAGNOSIS — F339 Major depressive disorder, recurrent, unspecified: Secondary | ICD-10-CM | POA: Diagnosis not present

## 2021-08-07 DIAGNOSIS — G4700 Insomnia, unspecified: Secondary | ICD-10-CM | POA: Diagnosis not present

## 2021-08-07 DIAGNOSIS — F411 Generalized anxiety disorder: Secondary | ICD-10-CM | POA: Diagnosis not present

## 2021-08-07 DIAGNOSIS — F339 Major depressive disorder, recurrent, unspecified: Secondary | ICD-10-CM | POA: Diagnosis not present

## 2021-08-16 DIAGNOSIS — Z630 Problems in relationship with spouse or partner: Secondary | ICD-10-CM | POA: Diagnosis not present

## 2021-08-16 DIAGNOSIS — F4321 Adjustment disorder with depressed mood: Secondary | ICD-10-CM | POA: Diagnosis not present

## 2021-08-19 ENCOUNTER — Ambulatory Visit: Payer: BC Managed Care – PPO | Admitting: Adult Health

## 2021-08-21 DIAGNOSIS — F339 Major depressive disorder, recurrent, unspecified: Secondary | ICD-10-CM | POA: Diagnosis not present

## 2021-08-21 DIAGNOSIS — F909 Attention-deficit hyperactivity disorder, unspecified type: Secondary | ICD-10-CM | POA: Diagnosis not present

## 2021-08-21 DIAGNOSIS — G4700 Insomnia, unspecified: Secondary | ICD-10-CM | POA: Diagnosis not present

## 2021-08-21 DIAGNOSIS — F411 Generalized anxiety disorder: Secondary | ICD-10-CM | POA: Diagnosis not present

## 2021-08-23 DIAGNOSIS — F32A Depression, unspecified: Secondary | ICD-10-CM | POA: Diagnosis not present

## 2021-08-23 DIAGNOSIS — R5383 Other fatigue: Secondary | ICD-10-CM | POA: Diagnosis not present

## 2021-08-23 DIAGNOSIS — F419 Anxiety disorder, unspecified: Secondary | ICD-10-CM | POA: Diagnosis not present

## 2021-08-23 DIAGNOSIS — I10 Essential (primary) hypertension: Secondary | ICD-10-CM | POA: Diagnosis not present

## 2021-08-26 DIAGNOSIS — Z91048 Other nonmedicinal substance allergy status: Secondary | ICD-10-CM | POA: Diagnosis not present

## 2021-08-26 DIAGNOSIS — F329 Major depressive disorder, single episode, unspecified: Secondary | ICD-10-CM | POA: Diagnosis not present

## 2021-08-26 DIAGNOSIS — J939 Pneumothorax, unspecified: Secondary | ICD-10-CM | POA: Diagnosis not present

## 2021-08-26 DIAGNOSIS — J9383 Other pneumothorax: Secondary | ICD-10-CM | POA: Diagnosis not present

## 2021-08-26 DIAGNOSIS — G459 Transient cerebral ischemic attack, unspecified: Secondary | ICD-10-CM | POA: Diagnosis not present

## 2021-08-26 DIAGNOSIS — F411 Generalized anxiety disorder: Secondary | ICD-10-CM | POA: Diagnosis not present

## 2021-08-26 DIAGNOSIS — G4733 Obstructive sleep apnea (adult) (pediatric): Secondary | ICD-10-CM | POA: Diagnosis not present

## 2021-08-26 DIAGNOSIS — R531 Weakness: Secondary | ICD-10-CM | POA: Diagnosis not present

## 2021-08-26 DIAGNOSIS — R55 Syncope and collapse: Secondary | ICD-10-CM | POA: Diagnosis not present

## 2021-08-26 DIAGNOSIS — J9311 Primary spontaneous pneumothorax: Secondary | ICD-10-CM | POA: Diagnosis not present

## 2021-08-26 DIAGNOSIS — R0789 Other chest pain: Secondary | ICD-10-CM | POA: Diagnosis not present

## 2021-08-26 DIAGNOSIS — Z8616 Personal history of COVID-19: Secondary | ICD-10-CM | POA: Diagnosis not present

## 2021-08-26 DIAGNOSIS — I10 Essential (primary) hypertension: Secondary | ICD-10-CM | POA: Diagnosis not present

## 2021-08-26 DIAGNOSIS — Z833 Family history of diabetes mellitus: Secondary | ICD-10-CM | POA: Diagnosis not present

## 2021-08-26 DIAGNOSIS — R202 Paresthesia of skin: Secondary | ICD-10-CM | POA: Diagnosis not present

## 2021-08-26 DIAGNOSIS — R079 Chest pain, unspecified: Secondary | ICD-10-CM | POA: Diagnosis not present

## 2021-08-26 DIAGNOSIS — F32A Depression, unspecified: Secondary | ICD-10-CM | POA: Diagnosis not present

## 2021-08-26 DIAGNOSIS — W19XXXA Unspecified fall, initial encounter: Secondary | ICD-10-CM | POA: Diagnosis not present

## 2021-08-26 DIAGNOSIS — R200 Anesthesia of skin: Secondary | ICD-10-CM | POA: Diagnosis not present

## 2021-08-26 DIAGNOSIS — Y92009 Unspecified place in unspecified non-institutional (private) residence as the place of occurrence of the external cause: Secondary | ICD-10-CM | POA: Diagnosis not present

## 2021-08-26 DIAGNOSIS — Z6841 Body Mass Index (BMI) 40.0 and over, adult: Secondary | ICD-10-CM | POA: Diagnosis not present

## 2021-09-03 DIAGNOSIS — H538 Other visual disturbances: Secondary | ICD-10-CM | POA: Diagnosis not present

## 2021-09-03 DIAGNOSIS — I10 Essential (primary) hypertension: Secondary | ICD-10-CM | POA: Diagnosis not present

## 2021-09-03 DIAGNOSIS — J9311 Primary spontaneous pneumothorax: Secondary | ICD-10-CM | POA: Diagnosis not present

## 2021-09-03 DIAGNOSIS — R531 Weakness: Secondary | ICD-10-CM | POA: Diagnosis not present

## 2021-09-03 DIAGNOSIS — Z09 Encounter for follow-up examination after completed treatment for conditions other than malignant neoplasm: Secondary | ICD-10-CM | POA: Diagnosis not present

## 2021-09-03 DIAGNOSIS — R079 Chest pain, unspecified: Secondary | ICD-10-CM | POA: Diagnosis not present

## 2021-09-05 DIAGNOSIS — F4321 Adjustment disorder with depressed mood: Secondary | ICD-10-CM | POA: Diagnosis not present

## 2021-09-05 DIAGNOSIS — Z630 Problems in relationship with spouse or partner: Secondary | ICD-10-CM | POA: Diagnosis not present

## 2021-09-11 DIAGNOSIS — F411 Generalized anxiety disorder: Secondary | ICD-10-CM | POA: Diagnosis not present

## 2021-09-11 DIAGNOSIS — G4700 Insomnia, unspecified: Secondary | ICD-10-CM | POA: Diagnosis not present

## 2021-09-11 DIAGNOSIS — F339 Major depressive disorder, recurrent, unspecified: Secondary | ICD-10-CM | POA: Diagnosis not present

## 2021-09-11 DIAGNOSIS — F909 Attention-deficit hyperactivity disorder, unspecified type: Secondary | ICD-10-CM | POA: Diagnosis not present

## 2021-09-19 DIAGNOSIS — Z302 Encounter for sterilization: Secondary | ICD-10-CM | POA: Diagnosis not present

## 2021-09-26 DIAGNOSIS — I471 Supraventricular tachycardia: Secondary | ICD-10-CM | POA: Diagnosis not present

## 2021-09-26 DIAGNOSIS — R531 Weakness: Secondary | ICD-10-CM | POA: Diagnosis not present

## 2021-09-26 DIAGNOSIS — R200 Anesthesia of skin: Secondary | ICD-10-CM | POA: Diagnosis not present

## 2021-10-01 DIAGNOSIS — G4700 Insomnia, unspecified: Secondary | ICD-10-CM | POA: Diagnosis not present

## 2021-10-01 DIAGNOSIS — F909 Attention-deficit hyperactivity disorder, unspecified type: Secondary | ICD-10-CM | POA: Diagnosis not present

## 2021-10-01 DIAGNOSIS — F339 Major depressive disorder, recurrent, unspecified: Secondary | ICD-10-CM | POA: Diagnosis not present

## 2021-10-01 DIAGNOSIS — F411 Generalized anxiety disorder: Secondary | ICD-10-CM | POA: Diagnosis not present

## 2021-10-03 DIAGNOSIS — R0600 Dyspnea, unspecified: Secondary | ICD-10-CM | POA: Diagnosis not present

## 2021-10-03 DIAGNOSIS — R42 Dizziness and giddiness: Secondary | ICD-10-CM | POA: Diagnosis not present

## 2021-10-10 DIAGNOSIS — J343 Hypertrophy of nasal turbinates: Secondary | ICD-10-CM | POA: Diagnosis not present

## 2021-10-10 DIAGNOSIS — G4733 Obstructive sleep apnea (adult) (pediatric): Secondary | ICD-10-CM | POA: Diagnosis not present

## 2021-10-31 DIAGNOSIS — G4700 Insomnia, unspecified: Secondary | ICD-10-CM | POA: Diagnosis not present

## 2021-10-31 DIAGNOSIS — F411 Generalized anxiety disorder: Secondary | ICD-10-CM | POA: Diagnosis not present

## 2021-10-31 DIAGNOSIS — F339 Major depressive disorder, recurrent, unspecified: Secondary | ICD-10-CM | POA: Diagnosis not present

## 2021-10-31 DIAGNOSIS — F909 Attention-deficit hyperactivity disorder, unspecified type: Secondary | ICD-10-CM | POA: Diagnosis not present

## 2021-12-03 DIAGNOSIS — F411 Generalized anxiety disorder: Secondary | ICD-10-CM | POA: Diagnosis not present

## 2021-12-03 DIAGNOSIS — R531 Weakness: Secondary | ICD-10-CM | POA: Diagnosis not present

## 2021-12-03 DIAGNOSIS — F909 Attention-deficit hyperactivity disorder, unspecified type: Secondary | ICD-10-CM | POA: Diagnosis not present

## 2021-12-03 DIAGNOSIS — F339 Major depressive disorder, recurrent, unspecified: Secondary | ICD-10-CM | POA: Diagnosis not present

## 2021-12-03 DIAGNOSIS — G4700 Insomnia, unspecified: Secondary | ICD-10-CM | POA: Diagnosis not present

## 2021-12-15 DIAGNOSIS — J984 Other disorders of lung: Secondary | ICD-10-CM | POA: Diagnosis not present

## 2021-12-15 DIAGNOSIS — J9383 Other pneumothorax: Secondary | ICD-10-CM | POA: Diagnosis not present

## 2021-12-15 DIAGNOSIS — Z8673 Personal history of transient ischemic attack (TIA), and cerebral infarction without residual deficits: Secondary | ICD-10-CM | POA: Diagnosis not present

## 2021-12-15 DIAGNOSIS — F411 Generalized anxiety disorder: Secondary | ICD-10-CM | POA: Diagnosis not present

## 2021-12-15 DIAGNOSIS — F329 Major depressive disorder, single episode, unspecified: Secondary | ICD-10-CM | POA: Diagnosis not present

## 2021-12-15 DIAGNOSIS — K219 Gastro-esophageal reflux disease without esophagitis: Secondary | ICD-10-CM | POA: Diagnosis not present

## 2021-12-15 DIAGNOSIS — R042 Hemoptysis: Secondary | ICD-10-CM | POA: Diagnosis not present

## 2021-12-15 DIAGNOSIS — Z6841 Body Mass Index (BMI) 40.0 and over, adult: Secondary | ICD-10-CM | POA: Diagnosis not present

## 2021-12-15 DIAGNOSIS — Z79899 Other long term (current) drug therapy: Secondary | ICD-10-CM | POA: Diagnosis not present

## 2021-12-15 DIAGNOSIS — I10 Essential (primary) hypertension: Secondary | ICD-10-CM | POA: Diagnosis not present

## 2021-12-15 DIAGNOSIS — Z87891 Personal history of nicotine dependence: Secondary | ICD-10-CM | POA: Diagnosis not present

## 2021-12-15 DIAGNOSIS — G4733 Obstructive sleep apnea (adult) (pediatric): Secondary | ICD-10-CM | POA: Diagnosis not present

## 2021-12-15 DIAGNOSIS — E890 Postprocedural hypothyroidism: Secondary | ICD-10-CM | POA: Diagnosis not present

## 2021-12-15 DIAGNOSIS — Z7982 Long term (current) use of aspirin: Secondary | ICD-10-CM | POA: Diagnosis not present

## 2021-12-15 DIAGNOSIS — Z7985 Long-term (current) use of injectable non-insulin antidiabetic drugs: Secondary | ICD-10-CM | POA: Diagnosis not present

## 2021-12-15 DIAGNOSIS — R079 Chest pain, unspecified: Secondary | ICD-10-CM | POA: Diagnosis not present

## 2022-07-28 ENCOUNTER — Telehealth: Payer: Self-pay

## 2022-07-28 NOTE — Transitions of Care (Post Inpatient/ED Visit) (Signed)
07/28/2022  Name: Tyler Hancock MRN: [id number] DOB: 1980/03/03  Today's TOC FU Call Status: Today's TOC FU Call Status:: Successful TOC FU Call Competed TOC FU Call Complete Date: 07/28/22  Transition Care Management Follow-up Telephone Call Date of Discharge: 07/26/22 Discharge Facility: Other ([facility]) Name of Other ([facility]) Discharge Facility: [facility] Type of Discharge: Inpatient Admission Primary Inpatient Discharge Diagnosis:: pneumothorax How have you been since you were released from the hospital?: Better Any questions or concerns?: No  Items Reviewed: Did you receive and understand the discharge instructions provided?: No Medications obtained,verified, and reconciled?: Yes (Medications Reviewed) Any new allergies since your discharge?: Yes Dietary orders reviewed?: Yes Do you have support at home?: Yes People in Home: spouse  Medications Reviewed Today: Medications Reviewed Today     Reviewed by Thresa Ross, MD (Psychiatrist) on 04/29/21 at 1556  Med List Status: <None>   Medication Order Taking? Sig Documenting Provider Last Dose Status Informant  AMBULATORY NON FORMULARY MEDICATION [id number] No Change Continuous positive airway pressure (CPAP) machine setting to 11 cm of H2O pressure, with 15 min ramp. Send 30 day report after 30 days of use. Rodolph Bong, MD Taking Active   aspirin EC 81 MG tablet [id number] No Take 81 mg by mouth daily. Swallow whole. [provider] Taking Active   B-D 3CC LUER-LOK SYR 23GX1" 23G X 1" 3 ML MISC [id number] No SMARTSIG:1 Syringe(s) IM Once a Week [provider] Taking Active   buPROPion (WELLBUTRIN XL) 150 MG 24 hr tablet [id number] No Take 3 tablets by mouth once daily Everrett Coombe, DO Taking Active   busPIRone (BUSPAR) 10 MG tablet [id number]  Take 1 tablet (10 mg total) by mouth daily as needed. Thresa Ross, MD  Active   DULoxetine (CYMBALTA) 30 MG capsule [id number]  Take 1 capsule (30 mg total) by  mouth daily. Delete prior refills. Dose increased Thresa Ross, MD  Active     Discontinued 01/24/21 1453   levothyroxine (SYNTHROID) 112 MCG tablet [id number] No Take 224 mcg by mouth every morning. [provider] Taking Active   NEEDLE, DISP, 18 G (B-D HYPODERMIC NEEDLE 18GX1.5") 18G X 1-1/2" MISC [id number] No USE AS DIRECTED to draw up testosterone Rodolph Bong, MD Taking Active   pantoprazole (PROTONIX) 40 MG tablet [id number] No Take 40 mg by mouth daily. [provider] Taking Active   SYRINGE DISPOSABLE 3CC 3 ML MISC [id number] No Use 1 syringe per week with testosterone injection. Rodolph Bong, MD Taking Active   testosterone cypionate (DEPOTESTOSTERONE CYPIONATE) 200 MG/ML injection [id number] No Inject 0.75 mLs (150 mg total) into the muscle once a week.  Patient taking differently: Inject 150 mg into the muscle once a week. 1ml per week   Rodolph Bong, MD Taking Active   valsartan (DIOVAN) 320 MG tablet [id number]  Take 1 tablet by mouth once daily Everrett Coombe, DO  Active             Home Care and Equipment/Supplies: Were Home Health Services Ordered?: NA Any new equipment or medical supplies ordered?: NA  Functional Questionnaire: Do you need assistance with bathing/showering or dressing?: No Do you need assistance with meal preparation?: No Do you need assistance with eating?: No Do you have difficulty maintaining continence: No Do you need assistance with getting out of bed/getting out of a chair/moving?: No Do you have difficulty managing or taking your medications?: No  Follow up appointments reviewed: PCP Follow-up appointment confirmed?: Yes Follow-up Provider: transferred to  [facility] Follow-up appointment confirmed?: NA Do you need transportation to your follow-up appointment?: No Do you understand care options if your condition(s) worsen?: Yes-patient verbalized understanding    SIGNATURE Karena Addison,  LPN [facility] Nurse Health Advisor Direct Dial [phone]

## 2022-09-25 ENCOUNTER — Telehealth: Payer: Self-pay

## 2022-09-25 NOTE — Transitions of Care (Post Inpatient/ED Visit) (Signed)
09/25/2022  Name: Tyler Hancock MRN: [id number] DOB: 12-Mar-1980  Today's TOC FU Call Status: Today's TOC FU Call Status:: Successful TOC FU Call Completed TOC FU Call Complete Date: 09/25/22  Transition Care Management Follow-up Telephone Call Date of Discharge: 09/24/22 Discharge Facility: Other ([facility]) Name of Other ([facility]) Discharge Facility: [facility] Type of Discharge: Inpatient Admission Primary Inpatient Discharge Diagnosis:: hemoptysis How have you been since you were released from the hospital?: Better Any questions or concerns?: No  Items Reviewed: Did you receive and understand the discharge instructions provided?: No Medications obtained,verified, and reconciled?: Yes (Medications Reviewed) Any new allergies since your discharge?: No Dietary orders reviewed?: Yes Do you have support at home?: Yes People in Home: spouse  Medications Reviewed Today: Medications Reviewed Today     Reviewed by Karena Addison, LPN (Licensed Practical Nurse) on 09/25/22 at 1424  Med List Status: <None>   Medication Order Taking? Sig Documenting Provider Last Dose Status Informant  AMBULATORY NON FORMULARY MEDICATION [id number] No Change Continuous positive airway pressure (CPAP) machine setting to 11 cm of H2O pressure, with 15 min ramp. Send 30 day report after 30 days of use. Rodolph Bong, MD Taking Active   aspirin EC 81 MG tablet [id number] No Take 81 mg by mouth daily. Swallow whole. [provider] Taking Active   B-D 3CC LUER-LOK SYR 23GX1" 23G X 1" 3 ML MISC [id number] No SMARTSIG:1 Syringe(s) IM Once a Week [provider] Taking Active   buPROPion (WELLBUTRIN XL) 150 MG 24 hr tablet [id number] No Take 3 tablets by mouth once daily Everrett Coombe, DO Taking Active   busPIRone (BUSPAR) 10 MG tablet [id number]  Take 1 tablet (10 mg total) by mouth daily as needed. Thresa Ross, MD  Active   DULoxetine (CYMBALTA) 30 MG capsule [id number]  Take 1 capsule  (30 mg total) by mouth daily. Delete prior refills. Dose increased Thresa Ross, MD  Expired 04/29/22 2359     Discontinued 01/24/21 1453   levothyroxine (SYNTHROID) 112 MCG tablet [id number] No Take 224 mcg by mouth every morning. [provider] Taking Active   NEEDLE, DISP, 18 G (B-D HYPODERMIC NEEDLE 18GX1.5") 18G X 1-1/2" MISC [id number] No USE AS DIRECTED to draw up testosterone Rodolph Bong, MD Taking Active   pantoprazole (PROTONIX) 40 MG tablet [id number] No Take 40 mg by mouth daily. [provider] Taking Active   SYRINGE DISPOSABLE 3CC 3 ML MISC [id number] No Use 1 syringe per week with testosterone injection. Rodolph Bong, MD Taking Active   testosterone cypionate (DEPOTESTOSTERONE CYPIONATE) 200 MG/ML injection [id number] No Inject 0.75 mLs (150 mg total) into the muscle once a week.  Patient taking differently: Inject 150 mg into the muscle once a week. 1ml per week   Rodolph Bong, MD Taking Active   valsartan (DIOVAN) 320 MG tablet [id number]  Take 1 tablet by mouth once daily Everrett Coombe, DO  Active             Home Care and Equipment/Supplies: Were Home Health Services Ordered?: NA Any new equipment or medical supplies ordered?: NA  Functional Questionnaire: Do you need assistance with bathing/showering or dressing?: No Do you need assistance with meal preparation?: No Do you need assistance with eating?: No Do you have difficulty maintaining continence: No Do you need assistance with getting out of bed/getting out of a chair/moving?: No Do you have difficulty managing or taking your medications?: No  Follow up appointments reviewed: PCP Follow-up appointment confirmed?: No (  patient transferred PCP) Specialist Hospital Follow-up appointment confirmed?: NA Do you need transportation to your follow-up appointment?: No Do you understand care options if your condition(s) worsen?: Yes-patient verbalized understanding  Patient transferred  PCP  SIGNATURE Karena Addison, LPN [facility] Nurse Health Advisor Direct Dial [phone]

## 2023-10-13 ENCOUNTER — Encounter: Payer: Self-pay | Admitting: Sports Medicine
